# Patient Record
Sex: Male | Born: 1950
Health system: Southern US, Community
[De-identification: ages and names within clinical notes are randomized; demographics above are authoritative.]

## PROBLEM LIST (undated history)

## (undated) DIAGNOSIS — Z950 Presence of cardiac pacemaker: Secondary | ICD-10-CM

## (undated) DIAGNOSIS — I25810 Atherosclerosis of coronary artery bypass graft(s) without angina pectoris: Secondary | ICD-10-CM

## (undated) DIAGNOSIS — I480 Paroxysmal atrial fibrillation: Secondary | ICD-10-CM

## (undated) DIAGNOSIS — I2510 Atherosclerotic heart disease of native coronary artery without angina pectoris: Secondary | ICD-10-CM

## (undated) DIAGNOSIS — I10 Essential (primary) hypertension: Secondary | ICD-10-CM

## (undated) DIAGNOSIS — I340 Nonrheumatic mitral (valve) insufficiency: Secondary | ICD-10-CM

## (undated) DIAGNOSIS — R001 Bradycardia, unspecified: Secondary | ICD-10-CM

## (undated) DIAGNOSIS — Z951 Presence of aortocoronary bypass graft: Secondary | ICD-10-CM

## (undated) DIAGNOSIS — K469 Unspecified abdominal hernia without obstruction or gangrene: Secondary | ICD-10-CM

## (undated) DIAGNOSIS — E785 Hyperlipidemia, unspecified: Secondary | ICD-10-CM

## (undated) DIAGNOSIS — R9439 Abnormal result of other cardiovascular function study: Secondary | ICD-10-CM

## (undated) HISTORY — PX: TRANSTHORACIC ECHOCARDIOGRAM: SHX275

## (undated) HISTORY — DX: Presence of cardiac pacemaker: Z95.0

## (undated) HISTORY — PX: NM MYOCAR PERF WALL MOTION: HXRAD629

## (undated) HISTORY — DX: Abnormal result of other cardiovascular function study: R94.39

## (undated) HISTORY — DX: Essential (primary) hypertension: I10

## (undated) HISTORY — DX: Paroxysmal atrial fibrillation: I48.0

## (undated) HISTORY — DX: Nonrheumatic mitral (valve) insufficiency: I34.0

## (undated) HISTORY — DX: Unspecified abdominal hernia without obstruction or gangrene: K46.9

## (undated) HISTORY — PX: PACEMAKER INSERTION: SHX728

## (undated) HISTORY — DX: Hyperlipidemia, unspecified: E78.5

## (undated) HISTORY — PX: HERNIA REPAIR: SHX51

## (undated) HISTORY — DX: Atherosclerotic heart disease of native coronary artery without angina pectoris: I25.10

## (undated) HISTORY — DX: Atherosclerosis of coronary artery bypass graft(s) without angina pectoris: I25.810

## (undated) HISTORY — PX: APPENDECTOMY: SHX54

## (undated) HISTORY — DX: Presence of aortocoronary bypass graft: Z95.1

## (undated) HISTORY — DX: Bradycardia, unspecified: R00.1

---

## 1994-07-14 DIAGNOSIS — R001 Bradycardia, unspecified: Secondary | ICD-10-CM

## 1994-07-14 HISTORY — DX: Bradycardia, unspecified: R00.1

## 1998-03-12 ENCOUNTER — Ambulatory Visit (HOSPITAL_COMMUNITY): Admission: RE | Admit: 1998-03-12 | Discharge: 1998-03-12 | Payer: Self-pay | Admitting: Cardiology

## 1998-08-27 ENCOUNTER — Ambulatory Visit (HOSPITAL_BASED_OUTPATIENT_CLINIC_OR_DEPARTMENT_OTHER): Admission: RE | Admit: 1998-08-27 | Discharge: 1998-08-27 | Payer: Self-pay | Admitting: *Deleted

## 1998-08-29 ENCOUNTER — Ambulatory Visit (HOSPITAL_COMMUNITY): Admission: RE | Admit: 1998-08-29 | Discharge: 1998-08-29 | Payer: Self-pay | Admitting: *Deleted

## 2002-01-25 ENCOUNTER — Encounter: Payer: Self-pay | Admitting: Thoracic Surgery

## 2002-01-26 ENCOUNTER — Ambulatory Visit (HOSPITAL_COMMUNITY): Admission: RE | Admit: 2002-01-26 | Discharge: 2002-01-26 | Payer: Self-pay | Admitting: Thoracic Surgery

## 2002-03-10 ENCOUNTER — Encounter: Payer: Self-pay | Admitting: Thoracic Surgery

## 2002-03-10 ENCOUNTER — Encounter: Admission: RE | Admit: 2002-03-10 | Discharge: 2002-03-10 | Payer: Self-pay | Admitting: Thoracic Surgery

## 2002-07-28 ENCOUNTER — Encounter (INDEPENDENT_AMBULATORY_CARE_PROVIDER_SITE_OTHER): Payer: Self-pay | Admitting: Specialist

## 2002-07-28 ENCOUNTER — Ambulatory Visit (HOSPITAL_BASED_OUTPATIENT_CLINIC_OR_DEPARTMENT_OTHER): Admission: RE | Admit: 2002-07-28 | Discharge: 2002-07-28 | Payer: Self-pay | Admitting: Urology

## 2003-07-15 DIAGNOSIS — Z951 Presence of aortocoronary bypass graft: Secondary | ICD-10-CM

## 2003-07-15 DIAGNOSIS — I2510 Atherosclerotic heart disease of native coronary artery without angina pectoris: Secondary | ICD-10-CM

## 2003-07-15 HISTORY — DX: Presence of aortocoronary bypass graft: Z95.1

## 2003-07-15 HISTORY — DX: Atherosclerotic heart disease of native coronary artery without angina pectoris: I25.10

## 2004-01-11 ENCOUNTER — Encounter: Admission: RE | Admit: 2004-01-11 | Discharge: 2004-01-11 | Payer: Self-pay | Admitting: Cardiology

## 2004-01-17 ENCOUNTER — Ambulatory Visit (HOSPITAL_COMMUNITY): Admission: RE | Admit: 2004-01-17 | Discharge: 2004-01-18 | Payer: Self-pay | Admitting: Cardiology

## 2004-01-17 HISTORY — PX: CARDIAC CATHETERIZATION: SHX172

## 2004-01-24 ENCOUNTER — Inpatient Hospital Stay (HOSPITAL_COMMUNITY): Admission: RE | Admit: 2004-01-24 | Discharge: 2004-01-28 | Payer: Self-pay | Admitting: Cardiothoracic Surgery

## 2004-01-24 HISTORY — PX: CORONARY ARTERY BYPASS GRAFT: SHX141

## 2004-03-01 ENCOUNTER — Encounter: Admission: RE | Admit: 2004-03-01 | Discharge: 2004-03-01 | Payer: Self-pay | Admitting: Cardiothoracic Surgery

## 2004-03-05 ENCOUNTER — Ambulatory Visit (HOSPITAL_COMMUNITY): Admission: RE | Admit: 2004-03-05 | Discharge: 2004-03-05 | Payer: Self-pay | Admitting: Cardiothoracic Surgery

## 2004-03-29 ENCOUNTER — Encounter: Admission: RE | Admit: 2004-03-29 | Discharge: 2004-03-29 | Payer: Self-pay | Admitting: Cardiothoracic Surgery

## 2004-04-03 ENCOUNTER — Ambulatory Visit (HOSPITAL_COMMUNITY): Admission: RE | Admit: 2004-04-03 | Discharge: 2004-04-03 | Payer: Self-pay | Admitting: Cardiothoracic Surgery

## 2004-04-05 ENCOUNTER — Encounter: Admission: RE | Admit: 2004-04-05 | Discharge: 2004-04-05 | Payer: Self-pay | Admitting: Cardiothoracic Surgery

## 2004-04-19 ENCOUNTER — Encounter: Admission: RE | Admit: 2004-04-19 | Discharge: 2004-04-19 | Payer: Self-pay | Admitting: Cardiothoracic Surgery

## 2004-05-10 ENCOUNTER — Encounter: Admission: RE | Admit: 2004-05-10 | Discharge: 2004-05-10 | Payer: Self-pay | Admitting: Cardiothoracic Surgery

## 2004-05-14 ENCOUNTER — Ambulatory Visit (HOSPITAL_COMMUNITY): Admission: RE | Admit: 2004-05-14 | Discharge: 2004-05-14 | Payer: Self-pay | Admitting: Cardiothoracic Surgery

## 2004-05-14 ENCOUNTER — Encounter (INDEPENDENT_AMBULATORY_CARE_PROVIDER_SITE_OTHER): Payer: Self-pay | Admitting: *Deleted

## 2004-05-17 ENCOUNTER — Encounter: Admission: RE | Admit: 2004-05-17 | Discharge: 2004-05-17 | Payer: Self-pay | Admitting: Cardiothoracic Surgery

## 2004-05-31 ENCOUNTER — Encounter: Admission: RE | Admit: 2004-05-31 | Discharge: 2004-05-31 | Payer: Self-pay | Admitting: Cardiothoracic Surgery

## 2004-06-14 ENCOUNTER — Encounter: Admission: RE | Admit: 2004-06-14 | Discharge: 2004-06-14 | Payer: Self-pay | Admitting: Cardiothoracic Surgery

## 2004-06-28 ENCOUNTER — Encounter: Admission: RE | Admit: 2004-06-28 | Discharge: 2004-06-28 | Payer: Self-pay | Admitting: Cardiothoracic Surgery

## 2004-07-02 ENCOUNTER — Ambulatory Visit (HOSPITAL_COMMUNITY): Admission: RE | Admit: 2004-07-02 | Discharge: 2004-07-02 | Payer: Self-pay | Admitting: Cardiothoracic Surgery

## 2004-08-16 ENCOUNTER — Encounter: Admission: RE | Admit: 2004-08-16 | Discharge: 2004-08-16 | Payer: Self-pay | Admitting: Cardiothoracic Surgery

## 2004-08-19 ENCOUNTER — Encounter: Admission: RE | Admit: 2004-08-19 | Discharge: 2004-08-19 | Payer: Self-pay | Admitting: Cardiothoracic Surgery

## 2004-08-22 ENCOUNTER — Encounter (INDEPENDENT_AMBULATORY_CARE_PROVIDER_SITE_OTHER): Payer: Self-pay | Admitting: Specialist

## 2004-08-22 ENCOUNTER — Inpatient Hospital Stay (HOSPITAL_COMMUNITY): Admission: RE | Admit: 2004-08-22 | Discharge: 2004-08-28 | Payer: Self-pay | Admitting: Cardiothoracic Surgery

## 2004-08-22 ENCOUNTER — Encounter (INDEPENDENT_AMBULATORY_CARE_PROVIDER_SITE_OTHER): Payer: Self-pay | Admitting: *Deleted

## 2004-09-13 ENCOUNTER — Encounter: Admission: RE | Admit: 2004-09-13 | Discharge: 2004-09-13 | Payer: Self-pay | Admitting: Cardiothoracic Surgery

## 2004-10-18 ENCOUNTER — Encounter: Admission: RE | Admit: 2004-10-18 | Discharge: 2004-10-18 | Payer: Self-pay | Admitting: Cardiothoracic Surgery

## 2005-04-18 ENCOUNTER — Encounter: Admission: RE | Admit: 2005-04-18 | Discharge: 2005-04-18 | Payer: Self-pay | Admitting: Cardiothoracic Surgery

## 2005-08-14 DIAGNOSIS — I25810 Atherosclerosis of coronary artery bypass graft(s) without angina pectoris: Secondary | ICD-10-CM

## 2005-08-14 HISTORY — PX: NM MYOCAR PERF WALL MOTION: HXRAD629

## 2005-08-14 HISTORY — DX: Atherosclerosis of coronary artery bypass graft(s) without angina pectoris: I25.810

## 2005-09-11 DIAGNOSIS — R9439 Abnormal result of other cardiovascular function study: Secondary | ICD-10-CM

## 2005-09-11 HISTORY — DX: Abnormal result of other cardiovascular function study: R94.39

## 2005-09-16 ENCOUNTER — Ambulatory Visit (HOSPITAL_COMMUNITY): Admission: RE | Admit: 2005-09-16 | Discharge: 2005-09-16 | Payer: Self-pay | Admitting: Cardiology

## 2005-09-16 HISTORY — PX: CARDIAC CATHETERIZATION: SHX172

## 2005-09-18 IMAGING — CR DG CHEST 2V
2 series · 2 of 2 positions shown · non-contrast
Comparison: none

CLINICAL DATA: Left lower chest pain. Follow up left pleural effusion.
CHEST, TWO VIEWS ? 09/13/04:

[view not recorded (1 of 2)]
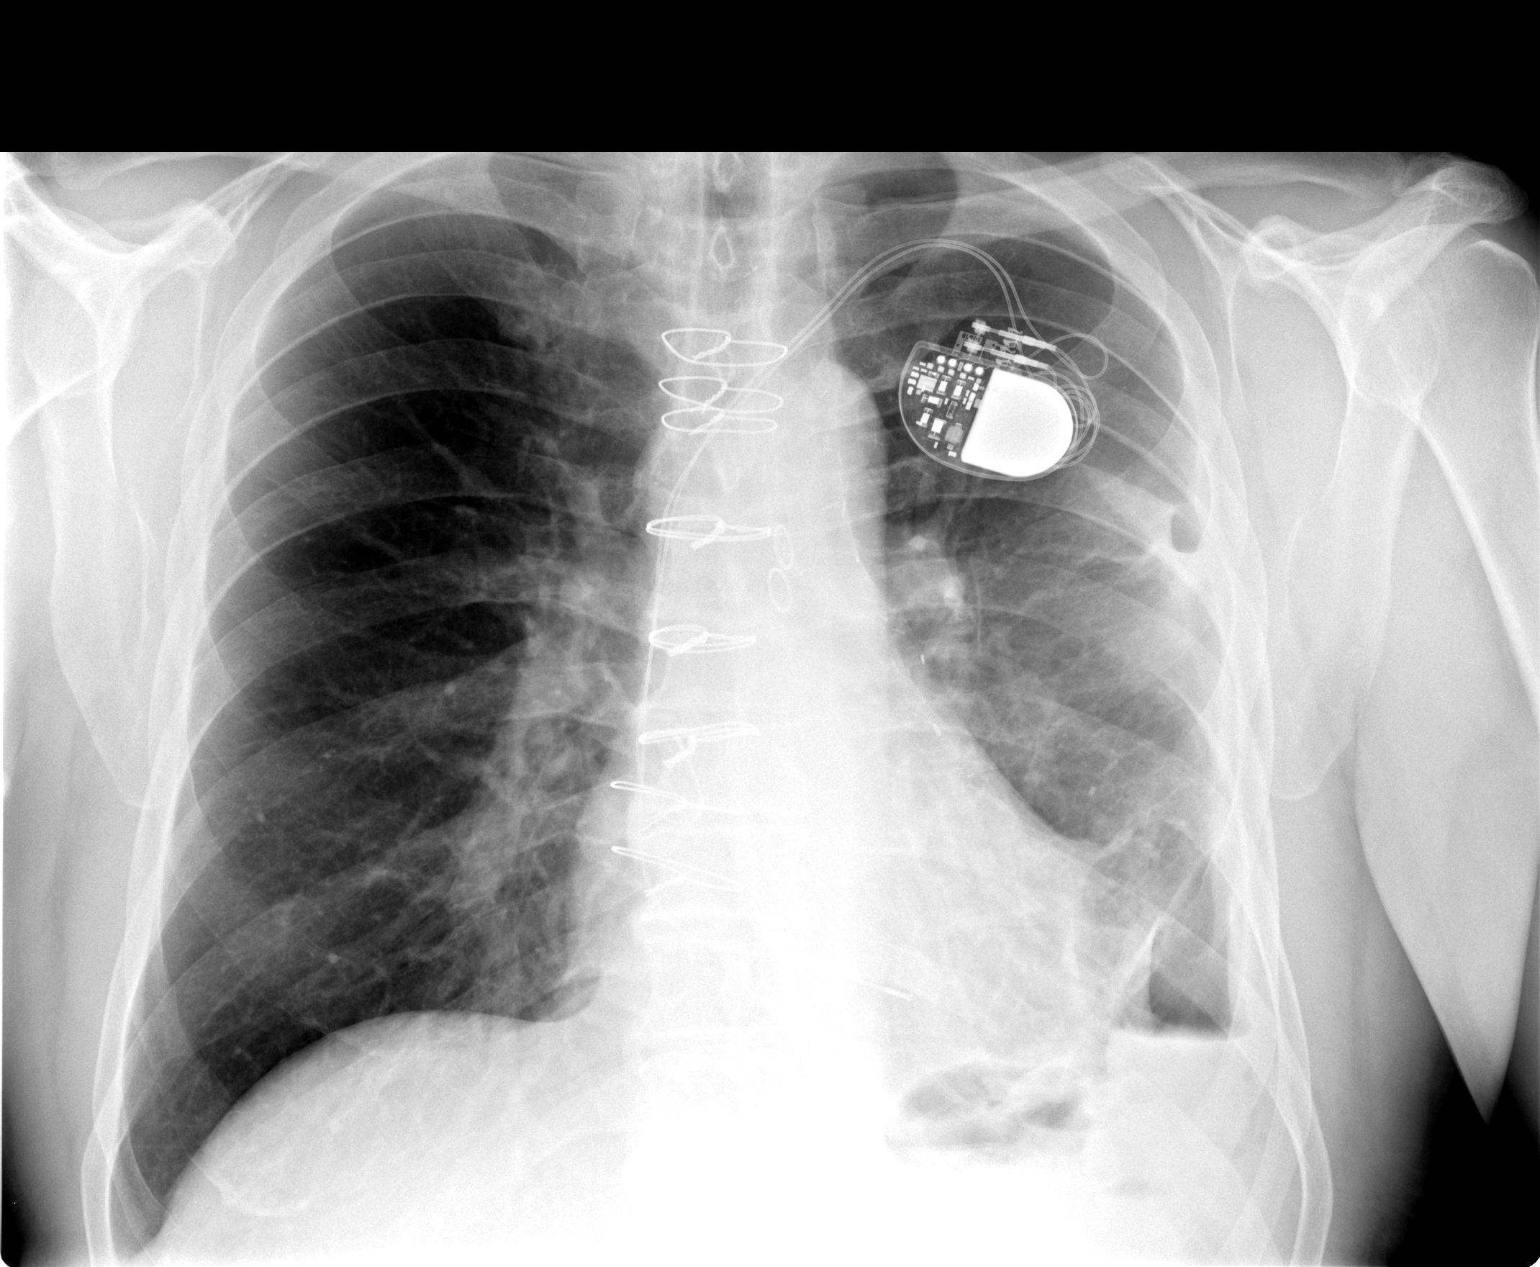

[view not recorded (2 of 2)]
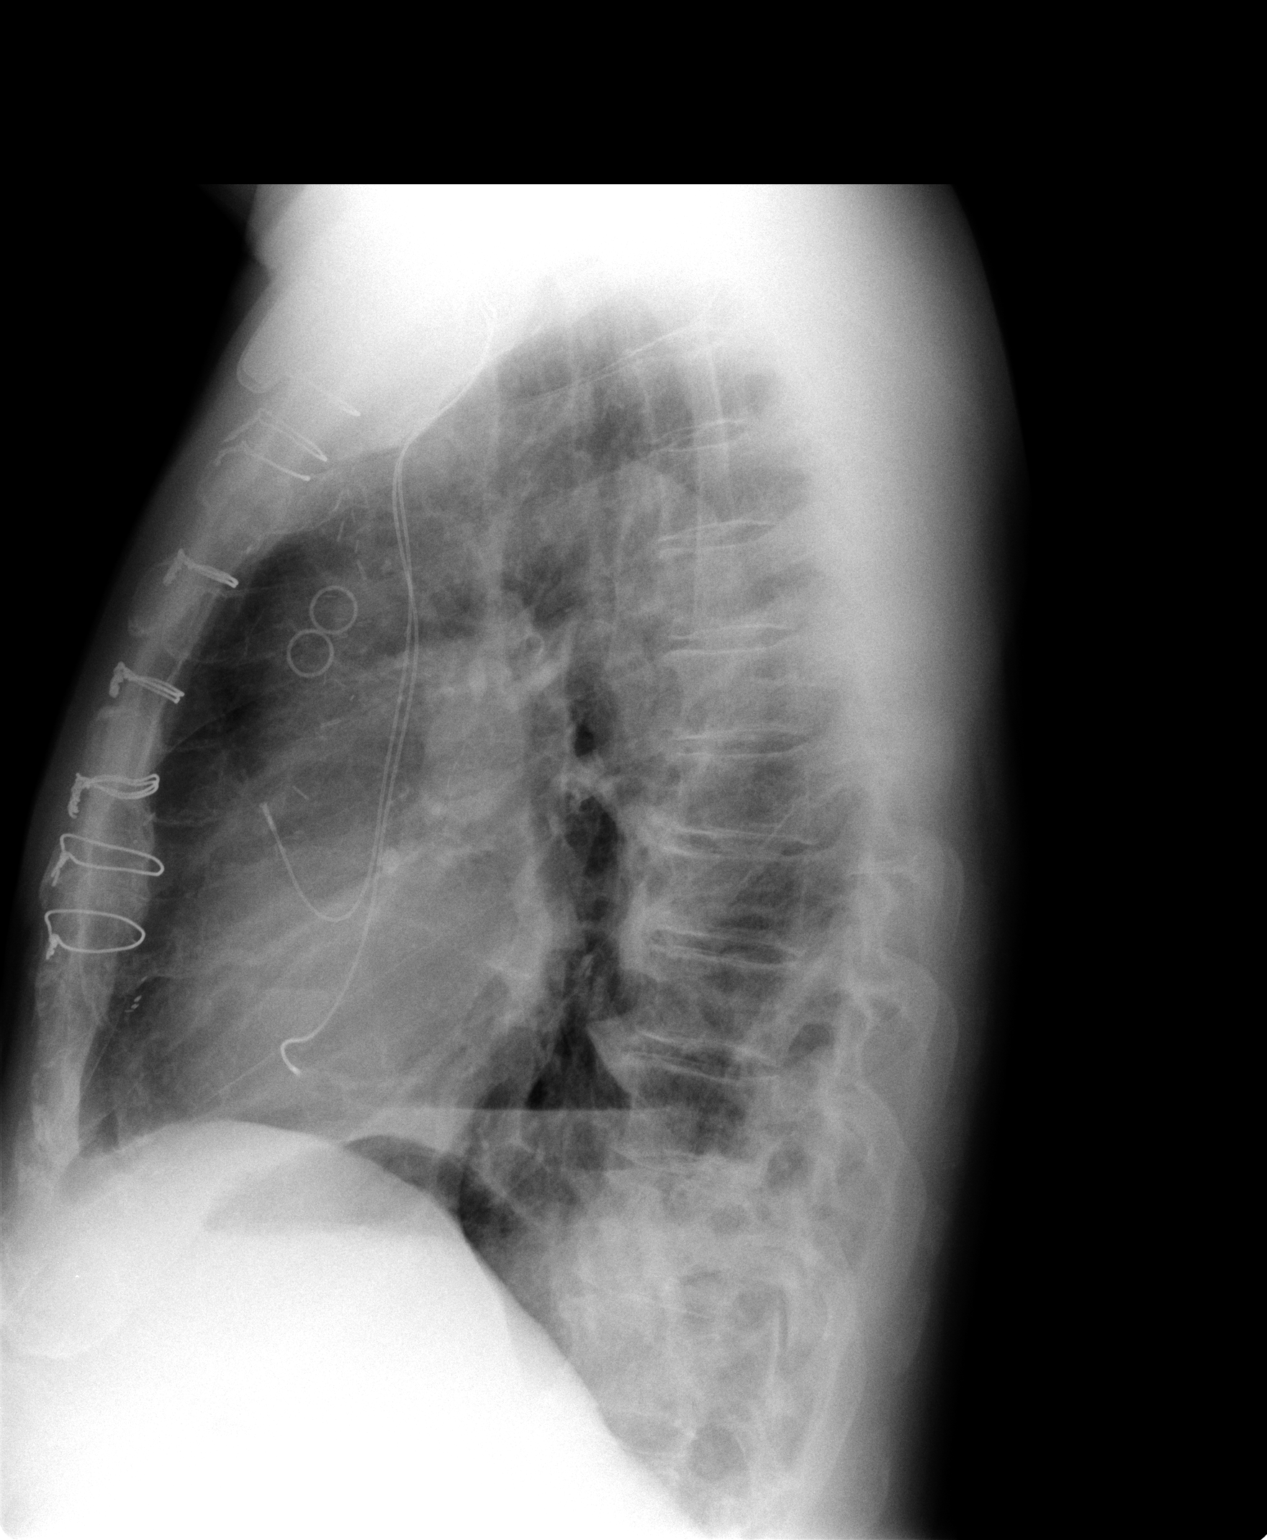

[2 of 2 positions shown; findings below may reference images not displayed]

FINDINGS: Since [REDACTED] chest x-ray of 08/28/04, there is decreased small areas of basilar and lesser upper loculated hydropneumothorax.  Linear subsegmental atelectasis or scarring is seen at the left lung base.  The lungs are otherwise clear.  Heart size is normal.  CABG changes and left sided permanent cardiac pacemaker leads are stable.
IMPRESSION: Since [REDACTED] chest x-ray of 08/28/04:
1.  Decreased loculated small left basilar and mild hydropneumothorax.
2.  Left basilar linear scarring and/or subsegmental atelectasis.
3.  Otherwise no active disease.

## 2006-08-04 ENCOUNTER — Ambulatory Visit (HOSPITAL_COMMUNITY): Admission: RE | Admit: 2006-08-04 | Discharge: 2006-08-04 | Payer: Self-pay | Admitting: *Deleted

## 2008-05-24 ENCOUNTER — Encounter (INDEPENDENT_AMBULATORY_CARE_PROVIDER_SITE_OTHER): Payer: Self-pay | Admitting: General Surgery

## 2008-05-24 ENCOUNTER — Ambulatory Visit (HOSPITAL_BASED_OUTPATIENT_CLINIC_OR_DEPARTMENT_OTHER): Admission: RE | Admit: 2008-05-24 | Discharge: 2008-05-24 | Payer: Self-pay | Admitting: General Surgery

## 2009-04-24 ENCOUNTER — Encounter: Admission: RE | Admit: 2009-04-24 | Discharge: 2009-04-24 | Payer: Self-pay | Admitting: Endocrinology

## 2010-11-26 NOTE — Op Note (Signed)
NAME:  [name], [name] NO.:  [id number]   MEDICAL RECORD NO.:  [id number]          PATIENT TYPE:  AMB   LOCATION:  [facility]                         FACILITY:  [facility]   PHYSICIAN:  Anselm Pancoast. Weatherly, M.D.DATE OF BIRTH:  1950-09-09   DATE OF PROCEDURE:  05/24/2008  DATE OF DISCHARGE:                               OPERATIVE REPORT   PREOPERATIVE DIAGNOSES:  Left inguinal hernia and also previously  infected epidermoid cyst, left arm.   OPERATION:  Left inguinal herniorrhaphy.  It was an indirect hernia with  a direct component and excision of a previously infected cyst on the  left arm.   ANESTHESIA:  General anesthesia, LOA with local supplementation.   HISTORY:  Erik Lane is a 60 year old Caucasian male who was  referred to me by Dr. Juleen China for a mildly symptomatic left inguinal  hernia.  The patient was seen in September.  He had seen Dr. Clarene Duke, his  cardiologist, recently.  He has got hypertension and has had coronary  artery disease with a pacemaker inserted in 1996.  He had bypass in 2005  and his last catheterization was with Dr. Clarene Duke in 2007.  He had small  vessels.  He has had a colonoscopy and he had a ruptured appendicitis  through a fairly large right lower quadrant incision 10 or more years  ago.  On exam he had an obvious left inguinal hernia that was easily  reducible but he had a large infected cyst on the left arm that I  unroofed in the office and treated with antibiotics.  I placed him on  Septra.  This had been I and D'd earlier in the year at one of the  urgent care centers and the area on the arm has greatly decreased in  size but there was still now an area about the size of a quarter that  you could feel the cyst contents and a lot of kind of resolving  erythema.  There has been no drainage or anything from the cyst on the  arm and I recommended that when we fixed this hernia that we will go  ahead and excise the area and keep him on  antibiotics.  The patient was  in agreement with this and preoperatively his laboratory studies were  normal, white count normal 6100 and electrolytes were normal.  We used  LOA, clipped the pubic hair, the left arm and left groin had both been  marked preoperatively and then the patient's areas were prepped with  Betadine solution.  We prepped the arm after completion of surgery and  just did it with sterile towels and just changed my gloves.  The left  inguinal incision area sharp dissection down through the skin and  superficial veins x2 were clamped, divided and ligated with fine Vicryl  and the external oblique aponeurosis was opened exposing the inguinal  canal area.  I elevated the cord structures with a Penrose drain.  It  was a fairly long indirect component about 4-5 inches kind of going down  into the top of the scrotum.  It was carefully separated  from the cord  structures and then a high sac ligation performed with 0 Surgilon under  direct vision with the sac open.  I put a second suture just distal and  removed the hernia sac, retracted up in the preperitoneal space and  anesthetized this with a few mLs of a mixture of Marcaine and Xylocaine.  I had done an inguinal block at the start of the procedure with a  blunted 21 gauge needle and then had anesthetized the skin area with a  25 gauge needle.  The cord structures then were elevated with a Penrose  and he has a direct component where this hernia has sort of broken down  the floor and I reapproximated the floor, the inguinal ligament was  sutured to the conjoined tendon with a running 2-0 Prolene and the two  tails sutured back together.  I then used a piece of Prolene mesh shaped  like a sail slit laterally to reinforce the floor.  It was sutured to  the inguinal ligament inferior with running 2-0 Prolene and then the  superior flap was sutured down with a couple of interrupted 2-0 Prolene  and predominantly 2-0 Vicryl.   It is lying flat, not under excessive  tension and I adjusted the little area at the internal ring area instead  of compromising the cord structures.  I then closed the external oblique  aponeurosis after removing the Penrose with a running 3-0 Vicryl and  then Scarpa's fascia was closed with interrupted 3-0 Vicryl, 4-0 Dexon  subcuticular, Benzoin and Steri-Strips on the skin.  The testicle was in  its normal position at the completion of the surgery.  The ilioinguinal  nerve had been placed with the cord structures coming out through the  internal ring where the mesh has been placed around.  Next I prepped the  left arm with a Betadine solution then used about 5 mL of the same  anesthetic solution as just kind of a field block more proximally and  then ellipsed out this area and it was previously infected epidermoid  cyst, no actual pus contents of this time but a lot of bleeding from the  previous inflammatory response.  Several little vessels were requiring  suturing with 4-0 Vicryl and then I closed the skin with alternating  simple and mattress sutures of 3-0 nylon.  I am going to place him on  Keflex for approximately 5 days and see him back in the office next week  and hopefully he will be able to return to work in approximately 2  weeks.  The epidermoid cyst was sent for pathologic exam.  The hernia  sac I did not send.           ______________________________  Anselm Pancoast. Zachery Dakins, M.D.     WJW/MEDQ  D:  05/24/2008  T:  05/24/2008  Job:  [id number]   cc:   Brooke Bonito, M.D.  Fax: [phone]

## 2010-11-29 NOTE — Op Note (Signed)
[place]  Patient:    Erik Lane, Erik Lane Visit Number: [id number] MRN: [id number]          Service Type: DSU Location: [facility] 2899 24 Attending Physician:  Cameron Proud Dictated by:   D. Karle Plumber, M.D. Admit Date:  01/26/2002 Discharge Date: 01/26/2002                             Operative Report  PREOPERATIVE DIAGNOSIS:  Generator failure.  POSTOPERATIVE DIAGNOSIS:  Generator failure.  OPERATION PERFORMED:  Generator replacement.  SURGEON:  D. Karle Plumber, M.D.  ANESTHESIA:  Xylocaine 1% with IV sedation.  DESCRIPTION OF PROCEDURE:  After prepping and draping the left chest, an area was infiltrated with 1% Xylocaine over the old pacemaker site, which the pacemaker was a DDD that had been put in for seven years and was now having failure.  An incision was made and dissection was carried down to the pacemaker generator.  The capsule was opened with a knife and the suture cut and the generator was dissected out.  It was unipolar but the patient had an intensive irregular rhythm of about 53.  The leads were then analyzed and the atrial lead, which was a 433-03 [employer] lead, had a voltage of 0.9 with an MA of 2.6 and a pulse width was 0.5 and the impedance was 430 ohms with 4.2 mV.  The ventricular lead was 0.4 minimum, the MA was 1.3, had 461 impedance with a pulse width of 0.5.  R wave was 11.4.  The P wave was 4.2 mV.  This was then connected to a new pacemaker, a Sigma [id number]-[id number], serial [id number], and this was placed in the pocket and sutured in the pocket with 3-0 silk.  Then the wound was closed with 3-0 Vicryl in the subcutaneous tissue and Dermabond on the skin.  The patient was returned to the recovery room in stable condition. Dictated by:   D. Karle Plumber, M.D. Attending Physician:  Cameron Proud DD:  01/26/02 TD:  01/28/02 Job: [id number]

## 2010-11-29 NOTE — Op Note (Signed)
NAME:  DILLIN, LOFGREN NO.:  [id number]   MEDICAL RECORD NO.:  [id number]          PATIENT TYPE:  INP   LOCATION:  2304                         FACILITY:  [facility]   PHYSICIAN:  Kathlee Nations Trigt III, M.D.DATE OF BIRTH:  08-Feb-1951   DATE OF PROCEDURE:  08/22/2004  DATE OF DISCHARGE:                                 OPERATIVE REPORT   OPERATION:  1.  Bronchoscopy  2.  Less video-assisted thoracoscopic surgery, thoracotomy, and      decortication of left lung.   PREOPERATIVE DIAGNOSIS:  Recurrent left pleural effusion, status post  coronary artery bypass graft.   POSTOPERATIVE DIAGNOSIS:  Recurrent left pleural effusion, status post  coronary artery bypass graft.   SURGEON:  Kerin Perna, M.D.   ASSISTANTDeniece Portela. Gold, P.A.-C.   ANESTHESIA:  General by Dr. Kipp Brood.   INDICATIONS:  The patient is a 60 year old male who underwent coronary  bypass grafting x4 in late 2005 utilizing left internal mammary artery  harvest.  He developed a late left pleural effusion which required  thoracentesis x3.  The cytologies and cultures on the fluid were negative  and appeared to be an inflammatory transudate.  Because of the recurrent  nature of this effusion despite all less invasive measures, it was felt that  a left VATS-thoracotomy and decortication would be indicated.  The patient  was symptomatic from his recurrent pleural effusion with shortness of breath  on exertion.   Prior to surgery, I examined the patient the office and reviewed the results  of his latest chest x-ray and CT scan and diagnosis of left recurrent  pleural effusion with the patient.  I discussed the recommended procedure  with him to include bronchoscopy and left VATS and thoracotomy with  decortication.  I discussed the use of general anesthesia, the location and  size of the surgical incision, the use of postoperative chest tube drainage,  and expected postoperative hospital recovery  period.  I reviewed with him  the major risks and complications of this procedure, including risks of  bleeding, pneumonia, prolonged air leak, infection, and death.  He  understood the alternatives to surgical therapy would be continued medical  therapy of this effusion, which is not been working.  He was ready to  proceed with the operation at this time.  Previously had discussed doing  this operation eight to 12 weeks earlier and he declined surgery because he  was not ready for another operation.  At this point he does understand the  aspects and risks, benefits, and agrees to proceed with operation as planned  under what I feel with an informed consent.   OPERATIVE FINDINGS:  The left lower pleural space was lined with a was a  leather-like compartment with a thick leather pleural peel on the lung  extending to the chest wall and diaphragm and the mediastinum.  This was  removed and both the visceral and parietal pleural specimens were sent for  pathology.  Bloody pleural fluid was sent for cytology and cultures.  A  pleural biopsy as well as pleural fluid was sent  for AFB cultures.   PROCEDURE:  The patient was brought to the operating room and placed supine  on the operating table, where  general anesthesia was induced.  Through an  endotracheal tube, a flexible bronchoscope was placed.  The distal trachea and carina were normal with a sharp angulation of the  carina.  The right mainstem bronchus was examined and found to be normal  without bronchial lesions.  The right upper lobe, right middle lobe and  right lower lobe segmental orifices were all examined and found to be patent  without  endobronchial lesions or mucosal abnormalities.  There were no secretions.  The bronchoscope was then withdrawn to the carina and passed down the left  mainstem bronchus.  The left mainstem bronchus was normal without  endobronchial lesions.  The segmental bronchial orifices of the left upper   lobe and left lower lobe were then examined individually and found to be  patent without endobronchial lesions or mucosal abnormalities.  There are no  secretions.  The bronchoscope was then withdrawn back.  The patient was then  exchanged for a double-lumen endotracheal tube by the anesthesiologist.  This was confirmed by bronchoscopy to be properly placed.  The patient was  then turned to expose the left chest, which was prepped and draped as a  sterile field.  An incision was made in the left sixth interspace.  The  video-assisted scope was inserted and the thorax was examined and found to  have a thick peel over the lung with some bloody pleural effusion.  This was  drained and sent for cytologies and culture. The video-scope was then  withdrawn.  The incision was extended along the fifth interspace to  approximately 12 cm.  A retractor was used to gently retract the ribs, but  the ribs were not divided or broken.  After removing all the pleural fluid,  it was apparent that the patient's  inflammatory process resulted with a  thick leather-like pleural peel over the left lower lobe and the lingula of  the left upper lobe.  This was sharply incised with a scalpel blade and  using gentle sharp and blunt dissection, a plane was developed between the  visceral pleura and this peel.  The peel was removed in the multiple large  pieces with care being taken to minimize injury to the lung.  The lingular  aspect was then also dissected using a separate sharp incision with a  scalpel and developing the tissue planes on either side of this incision to  mobilize the peel off the visceral surface of the lung.  This was extended  all the way to the pericardium and the mediastinum.  On the lower lobe the  decortication was extended all the way to the paraspinal area in the  posterior mediastinum.  Once the decortication was completed, the lung was irrigated with warm saline.  An area of parenchymal  separation was repaired  with a running of 4-0 chromic.  This was then over-coated with biologic  glue. Another area of denuded denuded, raw visceral pleural was also coated  with biologic glue along the aspect of the upper lobe.  The lung was gently  reinflated and filled the space well.  Two chest tubes were then placed, one  directed posteriorly and directed anteriorly, and brought out through  separate stab incisions and secured to the skin.  The lung was fully  inflated and the incision was closed.  The ribs were closed with three  figure-of-eight pericostal #2 Vicryls.  The two muscle layers were closed  with interrupted 0 Vicryl.  The subcutaneous and skin were closed with a running Vicryl and sterile  dressings were applied.  Prior to closure of the On-Q tissue analgesia  system was inserted with two catheters placed above and below the chest  incision and secured to the skin with silk sutures.  The patient was then  extubated and returned to the recovery room in stable condition.      PV/MEDQ  D:  08/22/2004  T:  08/23/2004  Job:  [id number]   cc:   Thereasa Solo. Little, M.D.  [address]  [address]  [place]  [place] [address]  Fax: [phone]

## 2010-11-29 NOTE — Cardiovascular Report (Signed)
NAME:  CASSANDRA, MCMANAMAN NO.:  [id number]   MEDICAL RECORD NO.:  [id number]          PATIENT TYPE:  OIB   LOCATION:  2899                         FACILITY:  [facility]   PHYSICIAN:  Thereasa Solo. Little, M.D. DATE OF BIRTH:  [birth year]   DATE OF PROCEDURE:  09/16/2005  DATE OF DISCHARGE:  09/16/2005                              CARDIAC CATHETERIZATION   This 60 year old male has had coronary bypass grafting July 2005. 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 The Cardiolite study was positive showing inferior  lateral ischemia. Because of this, he is brought to the cath lab for  outpatient cardiac catheterization.   After obtaining informed consent, the patient was prepped and draped in the  usual sterile fashion exposing the right groin. Following local anesthetic  with 1% Xylocaine, the Seldinger technique was employed and a 5-French  introducer sheath was placed in the right femoral artery. Left and right  coronary arteriography, graft visualization and ventriculography in the RAO  projection was performed.   COMPLICATIONS:  None.   TOTAL CONTRAST USED:  105 cc.   EQUIPMENT:  5-French Judkins configuration catheters. A RCB catheter was  required for the saphenous vein grafts. The internal mammary artery was  easily cannulated with the right coronary catheter.   RESULTS:  1.  Hemodynamic monitoring: Central aortic pressure was 108/70. Left      ventricular pressure 104/4 with no aortic valve gradient noted at the      time of pullback.  2.  Ventriculography: Ventriculography in the RAO projection revealed an      ejection fraction of approximately 50% and end-diastolic pressure was      10. There was some ectopy noted during the ventriculogram but no focal      wall motion abnormalities were noted.  3.  Coronary arteriography: On fluoroscopy, pacemaker wires were seen in      both the atrium and  the ventricle and there was dense calcification in      the left main, LAD and circumflex distribution.    1.  Left main: It bifurcated and was free of disease.  2.  LAD: The proximal portion of the LAD had a 40-50% area of eccentric      narrowing. There was a moderate-sized diagonal that was free of disease.      After the diagonal, there was evidence of bidirectional flow in the mid      and distal LAD. This was supplied by the internal mammary artery to the      distal LAD.  3.  Circumflex: The circumflex had diffuse disease in it. The first OM was      very small faintly visualized. The OM #2 was diffusely diseased      proximally, OM #3 was a moderate-sized vessel. There was tenting of both      these OM's where the saphenous vein graft inserted. The ongoing      circumflex had mild to moderate irregularities.  4.  Right coronary artery: Right coronary  was a codominant vessel giving      rise to the PDA. There was a mild area of 30% lucency in its midportion.      There was brisk TIMI III flow.   GRAFTS:  1.  Internal mammary artery to the LAD: The internal mammary artery was      widely patent. It inserted into the distal LAD which crossed the apex of      the heart and was free of disease. There was retrograde filling up the      LAD and into the diagonal.  2.  Saphenous vein graft to OM-1: This graft was totally occluded its      ostium.  3.  Saphenous vein graft sequentially to OM-2 and OM-3: The graft was widely      patent. OM-2 was a small graft with a small vessel. OM-3 was a larger      vessel. There was no high-grade stenosis in the ongoing OMs. Retrograde      filling of both the OMs showed rather significant disease proximally.   CONCLUSION:  1.  Loss of the saphenous vein graft to obtuse marginal-1. Widely patent      internal mammary artery to the left anterior descending artery, widely      patent saphenous vein graft sequentially to the obtuse marginal-2 and       obtuse marginal-3.  2.  Widely patent right coronary artery.  3.  Normal left ventricular systolic function.   I cannot from an anatomical standpoint correlate this strongly positive  nuclear study with what I find in catheterization today. It appears that the  nuclear study is a false-positive test. I see no reason he can not safely  continue his job as a [employer].   Because of this false-positive study, in two years when he will require a  repeat Cardiolite study, one would expect that to also showed similar  findings which will be positive and would probably prompt a cardiac  catheterization. 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           ______________________________  Thereasa Solo Little, M.D.     ABL/MEDQ  D:  09/16/2005  T:  09/16/2005  Job:  [id number]   cc:   Catheter Lab   Brooke Bonito, M.D.  Fax: [phone]

## 2010-11-29 NOTE — Op Note (Signed)
NAME:  MANTAJ, CHAMBERLIN                        ACCOUNT NO.:  [id number]   MEDICAL RECORD NO.:  [id number]                   PATIENT TYPE:  INP   LOCATION:  2304                                 FACILITY:  [facility]   PHYSICIAN:  Kerin Perna III, M.D.           DATE OF BIRTH:  1950-12-01   DATE OF PROCEDURE:  01/24/2004  DATE OF DISCHARGE:                                 OPERATIVE REPORT   OPERATION:  Coronary artery bypass grafting x 4 (left internal mammary  artery to the left anterior descending, saphenous vein graft to first obtuse  marginal, sequential saphenous vein graft to second obtuse marginal and  third obtuse marginal).   PREOPERATIVE DIAGNOSIS:  Class IV progressive angina with severe two vessel  coronary artery disease.   POSTOPERATIVE DIAGNOSIS:  Class IV progressive angina with severe two vessel  coronary artery disease.   SURGEON:  Kerin Perna, M.D.   ASSISTANT:  Toribio Harbour, N.P.-C.   ANESTHESIA:  General by Dr. Adonis Huguenin   INDICATIONS FOR PROCEDURE:  The patient is a 60 year old male who presented  with dyspnea on exertion and decreasing exercise tolerance.  Cardiac  catheterization by Dr. Caprice Kluver demonstrated severe two vessel disease of  the proximal LAD and of the circumflex system.  His ejection fraction was  55% and he was not felt to be a candidate for percutaneous intervention due  to the location of and anatomy of his LAD long stenosis.  I examined the  patient in the office and reviewed the results of the cardiac cath with the  patient.  I discussed the indications and expected benefits of coronary  bypass surgery for treatment of his coronary artery disease.  I discussed  the alternatives to surgical therapy for treatment of his coronary artery  disease and the expected outcome of those alternative therapies.  I reviewed  with the patient the major aspects of the planned procedure including the  choice of conduit for grafts to include  the mammary artery and saphenous  vein, the location of the surgical incisions, the use of general anesthesia  and the expected postoperative hospital recovery.  I discussed with the  patient the risks to him of coronary bypass surgery including the risks of  MI, CVA, bleeding, blood transfusion requirement, infection, and death.  He  understood the implications for the surgery and agreed to proceed with the  operation as planned under what I felt was an informed consent.   OPERATIVE FINDINGS:  The vein was harvested endoscopically from the right  leg and was of good quality.  The second diagonal branch of the LAD was not  grafted due to a hemodynamically insignificant disease.  The ventricle had  no focal areas of scarring or fibrosis.  The patient did not require blood  products for the operation.   PROCEDURE:  The patient was brought to the operating room and placed supine  on the  operating table.  General endotracheal anesthesia was induced.  The  chest, abdomen, and legs were prepped with Betadine and draped as a sterile  field.  A sternal incision was made as the saphenous vein was harvested from  the right leg endoscopically.  The left IMA was harvested as a pedicle graft  at its origin of the subclavian vessels and was a good vessel with excellent  flow.  Heparin was administered and the ACT was documented as being  therapeutic.  The sternal retractor was placed and the pericardium was  suspended.  Purse-strings were placed in the ascending aorta and right  atrium and the patient was cannulated and placed on bypass.  The coronaries  were identified for grafting and the mammary artery and vein grafts were  prepared for the distal anastomoses.  A cardioplegia cannula was placed in  the ascending aorta and the patient was cooled to 32 degrees.  The aortic  Cross clamp was applied.  800 mL of cold blood cardioplegia was delivered to  the aortic root with good cardioplegic arrest and  septal temperature  dropping to less than 14 degrees.  Topical iced saline slush was used to  augment myocardial preservation and a pericardial insufflator pad was used  to protect the left phrenic nerve.   The distal coronary anastomoses were then performed.  The first distal  anastomosis was to the OM1.  This was a 1.5 mm vessel and had a proximal 70%  stenosis.  A reversed saphenous vein was sewn end-to-side with running 7-0  Prolene.  There was good flow through the graft.  The second and third  distal anastomoses consisted of a sequential vein graft to the OM2 and OM3.  The OM2 was a 1.75 mm vessel with a proximal 80% stenosis and a side-to-side  anastomosis with the vein was sewn using running 7-0 Prolene.  There was  good flow through the anastomosis.  The third distal anastomosis was a  continuation of the sequential vein to the OM3.  This was a 1.5 mm vessel  with a proximal 60-70% stenosis.  The end of the vein was sewn end-to-side  with running 7-0 Prolene, there was good flow to the graft.  The  cardioplegia was redosed.  The fourth distal anastomosis was to the distal  third of the LAD.  This was a 1.5 mm vessel with proximal 80% stenosis at  its origin.  The left IMA pedicle was brought through an opening created in  the left lateral pericardium and was brought down onto the LAD and sewn end-  to-side with running 8-0 Prolene.  There was good flow through the  anastomosis with immediate rise in septal temperature after release of the  pedicle clamp on the mammary artery.  The mammary pedicle was secured to the  epicardium and the aortic Cross clamp was removed.   The heart was cardioverted back to a regular rhythm.  A partial occlusion  clamp was placed on the ascending aorta and two proximal vein anastomoses  were performed using 4 mm punch and running 6-0 Prolene.  The partial clamp  was removed and vein grafts were perfused.  Each had good flow and hemostasis was  documented at the proximal and distal anastomoses.  Temporary  pacing wires were applied.  The patient was rewarmed to 37 degrees.  The  lungs were expanded and the ventilator resumed.  The patient was weaned from  bypass without inotropes with good blood pressure and good cardiac  output  were stable.  Protamine was administered without adverse reaction.  The  cannulae were removed.  The mediastinum was irrigated with warm antibiotic  irrigation.  The leg incisions were irrigated and closed in a standard  fashion.  The pericardium was loosely reapproximated superiorly over the  aorta and vein grafts.  Two mediastinal and left pleural chest tubes were  placed and brought out through separate incisions.  The sternum was closed  with interrupted steel wire.  The pectoralis fascia was closed with a  running #1 Vicryl.  The subcutaneous tissue and skin layers were closed with  running Vicryl and sterile dressings were applied.  Total bypass time was  125 minutes with a Cross clamp time of 55 minutes.                                               Mikey Bussing, M.D.    PV/MEDQ  D:  01/24/2004  T:  01/24/2004  Job:  [id number]   cc:   Thereasa Solo. Little, M.D.  [address]  [address]  [place]  [place] [address]  Fax: [phone]

## 2010-11-29 NOTE — Discharge Summary (Signed)
NAME:  Erik Lane, Erik Lane                        ACCOUNT NO.:  [id number]   MEDICAL RECORD NO.:  [id number]                   PATIENT TYPE:  OIB   LOCATION:  2013                                 FACILITY:  [facility]   PHYSICIAN:  Thereasa Solo. Little, M.D.              DATE OF BIRTH:  04-15-1951   DATE OF ADMISSION:  01/17/2004  DATE OF DISCHARGE:  01/18/2004                                 DISCHARGE SUMMARY   DISCHARGE DIAGNOSES:  1. Increasing angina.  2. Coronary artery disease, significant, with plans for coronary artery     bypass grafting by Dr.  Donata Clay.  3. Hyperlipidemia.  4. History of syncope with pacemaker insertion in 1996 and upgraded July     2003.   CONDITION ON DISCHARGE:  Stable.   DISCHARGE MEDICATIONS:  1. Norvasc 5 mg 1 daily.  2. Lescol XL 80 mg daily.  3. Toprol XL 50 mg daily.  4. Enteric-coated aspirin 325 mg daily.  5. Nitroglycerin 1/150 sublingually as needed.   DISCHARGE INSTRUCTIONS:  1. No work.  2. No strenuous activity.  3. Low-fat, low-salt diet.  4. Wash catheterization site with soap and water.  Call if any bleeding,     swelling, or drainage.  5. See Dr. Donata Clay January 19, 2004, at 2 p.m.   HISTORY OF PRESENT ILLNESS:  A 60 year old white widowed male with a history  of coronary artery disease with prior catheterization revealing moderate  disease with a 50% LAD, 60% circumflex in 1996 and again in 1999.  Other  history includes hyperlipidemia, syncope with permanent pacemaker initially  implanted in 1996, upgraded to a [employer]onic in July 2003.  He also has  elevated cholesterol.   ALLERGIES:  No known allergies.   SOCIAL HISTORY:  Widowed with two children, no tobacco.   FAMILY HISTORY:  Positive for coronary artery disease.  Father died in his  20s; mother died at 45, one brother with heart disease.   PAST MEDICAL HISTORY:  As stated previously, also appendectomy and  hypertension.   REVIEW OF SYSTEMS:  See H&P.   PHYSICAL  EXAMINATION AT DISCHARGE:  VITAL SIGNS: Blood pressure 106/70,  pulse 60, respirations 20, temperature 97.5, oxygen saturation on room air  96%.  GENERAL:  Alert and oriented white male.  HEART:  S1 and S2, regular rate and rhythm.  LUNGS:  Clear.  ABDOMEN:  Soft, nontender.  Positive bowel sounds.  EXTREMITIES:  Right groin catheterization site without hematoma.  Right  dorsalis pedis 2+ and equal to the left.   LABORATORY DATA PRIOR TO DISCHARGE:  Hemoglobin 14.4, hematocrit 41.8, WBC  7.1, and platelets 174.  Sodium 139, potassium 3.7, BUN 11, creatinine 0.8,  and glucose 96.   PROCEDURES:  Cardiac catheterization done on January 17, 2004, showed  significant coronary disease.  Please see dictated report.  Left main  normal, though he had calcium deposits left main, LAD, and  proximal  circumflex.  LAD ostial 50 to 60% and down into his first diagonal with  maximal narrowing of 75 to 80%.  Ostium of first diagonal 50%, left  circumflex eccentric 60% at takeoff of the OM-1.  Three large OM's:  OM-2  ostial 50 to 60% stenosis.  RCA okay and a small PDA and posterolateral  artery.  EF was normal systolic function, mitral valve prolapse, EDP 9.  There was no renal artery stenosis, no abdominal aortic aneurysm.   HOSPITAL COURSE:  Erik Lane was admitted for cardiac catheterization on  January 17, 2004, which he underwent without complications, but due to his  disease status, he was kept overnight and was stable for discharge the  morning of January 18, 2004.  He was seen and evaluated by Dr. Elsie Lincoln.  The  patient will see Dr. Donata Clay on July 8 and knows not to work until after  his bypass grafting.      Darcella Gasman. Ingold, N.P.                     Thereasa Solo. Little, M.D.    LRI/MEDQ  D:  01/18/2004  T:  01/19/2004  Job:  [id number]   cc:   Brooke Bonito, M.D.  [address]  [place]  [place] [address]  Fax: [phone]   Mikey Bussing, M.D.  [address]  [place]  [place]  [address]

## 2010-11-29 NOTE — Op Note (Signed)
NAME:  Erik Lane, Erik Lane NO.:  [id number]   MEDICAL RECORD NO.:  [id number]          PATIENT TYPE:  AMB   LOCATION:  ENDO                         FACILITY:  [facility]   PHYSICIAN:  Georgiana Spinner, M.D.    DATE OF BIRTH:  1950/12/17   DATE OF PROCEDURE:  08/04/2006  DATE OF DISCHARGE:                               OPERATIVE REPORT   SURGEON:  Georgiana Spinner, M.D.   PROCEDURE:  Colonoscopy.   INDICATIONS:  Colon cancer screening.   ANESTHESIA:  Fentanyl 75 mcg, Versed 7 mg.   DESCRIPTION OF PROCEDURE:  With the patient mildly sedated in the left  lateral decubitus position, a rectal exam was performed, which was  unremarkable to my examination.  Subsequently, the Pentax videoscopic  colonoscope was inserted into the rectum and passed under direct vision  to the cecum, identified by the ileocecal valve and appendiceal orifice,  both of which were photographed.  From this point, the colonoscope was  slowly withdrawn, taking circumferential views of colonic mucosa,  stopping in the rectum, which appeared normal, and showed hemorrhoids on  retroflex view.  The endoscope was then straightened and withdrawn.  The  patient's vital signs and pulse oximetry remained stable.  The patient  tolerated the procedure well and without apparent complications.   FINDINGS:  Internal hemorrhoids, otherwise, a rather unremarkable  colonoscopic examination.   PLAN:  Consider repeat examination in 5 to 10 years.           ______________________________  Georgiana Spinner, M.D.     GMO/MEDQ  D:  08/04/2006  T:  08/04/2006  Job:  [id number]

## 2010-11-29 NOTE — Discharge Summary (Signed)
NAME:  Erik Lane, Erik Lane NO.:  [id number]   MEDICAL RECORD NO.:  [id number]                   PATIENT TYPE:  INP   LOCATION:  2018                                 FACILITY:  [facility]   PHYSICIAN:  Kerin Perna, M.D.               DATE OF BIRTH:  1950-12-19   DATE OF ADMISSION:  01/24/2004  DATE OF DISCHARGE:  01/28/2004                                 DISCHARGE SUMMARY   HISTORY OF PRESENT ILLNESS:  This is a 60 year old, hypertensive, white male  referred to Dr. Donata Clay for consideration of surgical coronary  revascularization due to recently diagnosed severe two-vessel disease.  The  patient has a history of coronary disease as well as previous placement of a  pacemaker in 1996, for bradyarrhythmia.  The patient describes recent  substernal chest tightness and fullness relieved with rest.  He did not have  any resting symptoms or symptoms of orthopnea or PND.  The patient felt he  had a decreased exercise tolerance with shortness of breath on exertion.  He  was evaluated by Dr. Clarene Duke in his office and subsequent cardiac  catheterization was performed demonstrating 90% stenosis of the LAD and  diagonal, 80% stenosis of the obtuse marginal-2 and 70% stenosis of the  obtuse marginal-1.  His ejection fraction was 55% with a left ventricular  end-diastolic pressure of 10 mmHg.  Based on the coronary anatomy and  symptoms of Class III angina, he was felt to be a candidate for surgical  revascularization and was admitted this hospitalization for the procedure.   MEDICATIONS:  1.  Norvasc 5 mg daily.  2.  Lescol XL 80 mg q.d.  3.  Toprol XL 50 mg q.d.  4.  Aspirin one daily.   ALLERGIES:  No known drug allergies.   PAST MEDICAL HISTORY:  1.  Hypertension.  2.  Hyperlipidemia.  3.  Syncope with bradycardia and permanent pacemaker placement.   For family history, social history, review of systems, physical exam, please  see History and Physical done  at the time of admission.   HOSPITAL COURSE:  The patient was admitted electively and on January 24, 2004,  taken to the operating room where he underwent coronary artery bypass graft  x4.  The following grafts were placed:  Left internal mammary artery to the  left anterior descending, saphenous vein graft to obtuse marginal-1,  saphenous vein graft sequential to obtuse marginal-2 and obtuse marginal-3.  Cross clamp time was 55 minutes with pump time 125 minutes.  The patient  tolerated the procedure well and was taken to the surgical intensive care  unit in stable condition.   The patient has done well postoperatively.  He has maintained stable  hemodynamics.  He was weaned from the ventilator without difficulty.  Initially, he did require some Neo-Synephrine for blood pressure, but this  was weaned without difficulty.  All routine lines, monitors and drainage  devices were discontinued in a standard fashion.  The patient did have an  episode of abnormal pacer activity requiring interrogation which was  performed on January 26, 2004, with resolution of abnormal pacemaker activity.  The patient has responded well to routine cardiac rehabilitation phase I  modalities.  Incisions were all healing well without signs of infection.  Oxygen has been weaned and he maintains good saturations on room air.  Overall, the patient was felt to be quite stable for discharge on January 28, 2004.   DISCHARGE MEDICATIONS:  1.  Lescol 80 mg daily.  2.  Aspirin 325 mg daily.  3.  Tylox one to two every four to six hours as needed.   FOLLOW UP:  Follow up with Dr. Donata Clay.  The office would call for  arrangements.  Dr. Clarene Duke would follow up with the patient in 2 weeks.   DISCHARGE DIAGNOSES:  1.  Severe two-vessel coronary artery disease as described, now status post      surgical revascularization.  2.  Previous permanent pacemaker.  3.  Hypertension.   CONDITION ON DISCHARGE:  Stable and  improved.      [name], P.A.-C.                    Kerin Perna, M.D.    [name]  D:  03/26/2004  T:  03/26/2004  Job:  [id number]   cc:   Thereasa Solo. Little, M.D.  [address]  [address]  [place]  [place] [address]  Fax: [phone]   Brooke Bonito, M.D.  [address]  [place]  [place] [address]  Fax: [phone]

## 2010-11-29 NOTE — Op Note (Signed)
NAME:  [name], Erik Lane                        ACCOUNT NO.:  [id number]   MEDICAL RECORD NO.:  [id number]                   PATIENT TYPE:  AMB   LOCATION:  [facility]                                 FACILITY:  [facility]   PHYSICIAN:  Excell Seltzer. Annabell Howells, M.D.                 DATE OF BIRTH:  1950-08-06   DATE OF PROCEDURE:  DATE OF DISCHARGE:                                 OPERATIVE REPORT   PREOPERATIVE DIAGNOSIS:  Scrotal lesions.   POSTOPERATIVE DIAGNOSIS:  Scrotal lesions.   PROCEDURE:  Excision of scrotal  lesions.   SURGEON:  Excell Seltzer. Annabell Howells, M.D.   ASSISTANT:  Crecencio Mc, M.D.   ANESTHESIA:  General.   COMPLICATIONS:  None.   INDICATIONS:  The patient is a 60 year old gentleman who presented to the  urology clinic with a complaint of scrotal lesions. These scrotal lesions  were longstanding and were noted by the patient to cause discomfort. On  examination, they were consistent with probable sebaceous cysts. After  discussing options, the patient elected to have these surgically  removed.  The potential risks and benefits of this procedure were explained to the  patient and he consented.   DESCRIPTION OF PROCEDURE:  The patient was brought to the operating room and  general anesthetic was administered. The patient was laid supine and his  genitalia were prepped and draped in the usual sterile fashion.   Next his scrotum was examined. This revealed multiple whitish appearing  scrotal lesions varying in size. There were approximately 20 lesions total,  with the smallest measuring approximately 1 mm and the largest measuring  approximately 3 cm.   There was noted to be a very small lesion inferiorly in the midline of the  scrotum and isolated. This was removed with simple excision with Metzenbaum  scissors and this area was cauterized.   There was also noted to be a large area across the anterior middle of the  scrotum which contained approximately 15 to 16 lesions. It was decided  to  excise these in a large elliptical skin incision, as the patient had plenty  of scrotal skin to cover this defect. Therefore a large transverse  elliptical incision was made, covering an area of approximately 12 x 8 cm.   An incision was made with a #15 blade  and Bovie electrocautery was used to  dissect the skin off of the dartos tissue. Once this entire skin island was  removed, hemostasis was achieved with electrocautery.   The dartos tissue was then brought together with interrupted 3-0 chromic  sutures. A second skin layer closure was then performed with a running  vertical mattress 3-0 chromic suture. The edges were infolded approximately  1 to 2 cm, resulting in a nice cosmetic result.   There were also noted to be 2 larger lesions more superiorly on the anterior  scrotum on either side of the penile base. These were  excised individually  in the aforementioned manner, and closed with vertical mattress skin  sutures.   A fluff dressing was then applied to the scrotum and a scrotal support was  placed. The patient was able to be extubated and transferred to the recovery  room in satisfactory condition. Please note that Dr. Bjorn Pippin was the  operating surgeon and was present and participated in this entire procedure.      Crecencio Mc, M.D.                          Excell Seltzer. Annabell Howells, M.D.    LB/MEDQ  D:  07/28/2002  T:  07/28/2002  Job:  [id number]

## 2010-11-29 NOTE — Discharge Summary (Signed)
NAME:  Erik Lane, Erik Lane NO.:  [id number]   MEDICAL RECORD NO.:  [id number]          PATIENT TYPE:  INP   LOCATION:  2004                         FACILITY:  [facility]   PHYSICIAN:  Kerin Perna, M.D.  DATE OF BIRTH:  [birth year]   DATE OF ADMISSION:  08/22/2004  DATE OF DISCHARGE:  08/28/2004                                 DISCHARGE SUMMARY   HISTORY OF PRESENT ILLNESS:  The patient is a 60 year old white male who  underwent four vessel coronary artery bypass grafting in July 2005, for  progressive angina with severe coronary artery disease.  He developed left  pleural effusion approximately eight to 10 weeks following the surgery and  was treated with thoracentesis with short term relief of his symptoms and  shortness of breath and pleuritic chest pain.  However, the infusions have  returned despite courses of oral prednisone and Lasix.  A CT scan performed  in late September showed no evidence of neoplastic disease.  His cytologies  and cultures of the fluid have been negative with reactive mesothelial cells  and no growth on culture.  A thoracentesis was last performed on July 02, 2004, when 250 mL of bloody effusion was drained.  A chest x-ray showed  recurrence of the effusion.  Dr. Donata Clay discussed the left video assisted  thoracoscopy and decortication procedure with the patient.  It was felt that  the patient had no other nonsurgical options for full relief of symptoms and  agreed to proceed with the surgery and was admitted this hospitalization for  the procedure.  The patient had no symptoms of fever or weight loss and he  was still working a full shift at [employer].   MEDICATIONS PRIOR TO ADMISSION:  1.  Aspirin 81 mg a day.  2.  Lipitor 10 mg a day.   ALLERGIES:  None.   Family history, social history, review of systems and physical examination  please see the history and physical done at the time of admission.   PAST SURGICAL  HISTORY:  1.  Appendectomy age 27.  2.  Permanent pacemaker placed in 1996, generator change 2003.  3.  Coronary artery bypass grafting as described above 2005.   HOSPITAL COURSE:  The patient was admitted electively and on August 22, 2004, taken to the operating room where he underwent the following  procedure:  1.  Bronchoscopy.  2.  Left video assisted thoracoscopy, thoracotomy, and decortication of the      left lung.  The patient tolerated the procedure well and was taken to the post  anesthesia care unit in stable condition.   POSTOPERATIVE HOSPITAL COURSE:  Patient has done well.  He has been  monitored closely and chest tubes have been discontinued in the standard  stepwise fashion.  The patient has remained hemodynamically stable.  His  incision is healing well without evidence of infection.  All cultures and  pathology are negative.  His last chest tube was removed on August 27, 2004.  Chest x-ray will be obtained in the morning of August 28, 2004, and  if there are no significant findings and he is otherwise clinically stable,  he will be discharged at that time.   LABORATORY DATA:  Body fluid cultures negative growths x4 days.  Tissue  culture negative growth x4 days.  Electrolytes, BUN and creatinine within  normal limits with the exception of slightly elevated glucose of 111 on  August 26, 2004.  Most recent CBC reveals the white blood cell count to be  9.0, hemoglobin and hematocrit 11.1 and 32.9, platelet count 193,000 on  August 24, 2004.  Gram stain of the body fluid collection showed no  organisms, no white blood cells.  Acid fast bacilli culture and smear is  negative thus far.   DISCHARGE MEDICATIONS:  As preoperatively. Additionally for pain, Tylox one  or two every four to six hours as needed.   INSTRUCTIONS:  The patient will receive written instructions in regard to  medications, activity, diet, wound care and follow-up.   FOLLOW UP:  Kerin Perna, M.D., on September 13, 2004, at 11:15.  His sutures  will be removed on September 02, 2004 at the [facility] office by the nurse.   FINAL DIAGNOSES:  1.  Recurrent left pleural effusion, status post coronary artery bypass      grafting, now status post thoracotomy and decortication with excellent      clinical results.  2.  Other diagnoses as previously listed per the history.   CONDITION ON DISCHARGE:  Stable and improved.      WEG/MEDQ  D:  08/27/2004  T:  08/27/2004  Job:  [id number]   cc:   Thereasa Solo. Little, M.D.  [address]  [address]  [place]  [place] [address]  Fax: [phone]   Brooke Bonito, M.D.  [address]  [place]  [place] [address]  Fax: [phone]

## 2010-11-29 NOTE — Cardiovascular Report (Signed)
NAME:  Erik Lane, Erik Lane                        ACCOUNT NO.:  [id number]   MEDICAL RECORD NO.:  [id number]                   PATIENT TYPE:  OIB   LOCATION:  2857                                 FACILITY:  [facility]   PHYSICIAN:  Thereasa Solo. Little, M.D.              DATE OF BIRTH:  [birth year]   DATE OF PROCEDURE:  01/17/2004  DATE OF DISCHARGE:                              CARDIAC CATHETERIZATION   INDICATION FOR TEST:  This 60 year old gentleman has a permanent pacemaker  in for symptomatic bradycardia.  It was placed in the late 1990s.  He has  had in the past chest pain and had a nuclear study performed that was false  positive.  The study in 1999 showed ischemia but at the time of this cardiac  catheterization he did not have significant coronary disease.  He presented  now with a recurrence of exertional chest pain at this time, clearly anginal  in nature, and because of this is brought in for an outpatient cardiac  catheterization.   After obtaining informed consent, the patient was prepped and draped in the  usual sterile fashion exposing the right groin.  Following local anesthetic  with 1% Xylocaine, the Seldinger technique was employed and a 5 [place]  introducer sheath was placed into the right femoral artery.  Left and right  coronary arteriography, ventriculography in the RAO projection, and a distal  aortogram were performed.   COMPLICATIONS:  None.   EQUIPMENT:  5 French Judkins configuration catheters.   TOTAL CONTRAST:  115 mL.   RESULTS:  1. Hemodynamic monitoring:  Central aortic pressure was 126/71.  Left     ventricular pressure was 124/9 with no significant valve gradient noted     at the time of pullback.  2. Ventriculography:  Ventriculography in the RAO projection revealed normal     left ventricular systolic function.  There was mitral valve prolapse     without mitral regurgitation seen.  There was ventricular ectopy during     the ventriculogram.  The left  ventricular end-diastolic pressure was 9.  3. Distal aortogram done at the level of the renal arteries showed no     evidence of renal artery stenosis and no abdominal aortic aneurysm.   CORONARY ARTERIOGRAPHY:  On fluoroscopy there was dense calcification seen  in the left main, proximal circumflex, and the LAD.   1. Left main normal.  It bifurcated.  2. LAD:  The LAD extended down to the apex of the heart.  There was ostial     50-60% narrowing, and this area of narrowing extended well down past the     ostium of the first diagonal.  The maximum amount of narrowing was about     75-80% just proximal to the diagonal vessel.  The ostium of the diagonal     also had an area of 50% narrowing.  The distal vessel did not have  significant disease.  3. Circumflex:  The circumflex was a large vessel giving rise to three OM     vessels, one of which bifurcated.  OM-2 had an ostial area of 50-60%     narrowing.  In a bend in the ongoing circumflex at the takeoff of the     first OM was a mildly calcified area of 60% eccentric narrowing.  4. Right coronary artery:  The right coronary artery was a small vessel with     a small PDA and posterolateral vessel that showed no significant disease.   CONCLUSION:  1. Normal left ventricular systolic function.  2. Mitral valve prolapse.  3. No abdominal aortic aneurysm.  4. Calcific coronary disease extending from the ostium of the left anterior     descending coronary artery past the first diagonal with disease involving     the circumflex and the ostium of obtuse marginal #2.   Because of the location of the left anterior descending artery disease, I do  not believe that this can be managed safely percutaneously.  I have asked  the surgeons to evaluate the patient for revascularization.  If for some  reason he would be turned down attempts at intervention would be undertaken,  but it would clearly be at a high risk in the event there would be   retrograde dissection of the left main.  It would also require Rotablator  because of the dense calcium load.                                               Thereasa Solo. Little, M.D.    ABL/MEDQ  D:  01/17/2004  T:  01/18/2004  Job:  [id number]   cc:   Brooke Bonito, M.D.  [address]  [place]  [place] [address]  Fax: [phone]   Cardiac Catheterization Lab

## 2011-04-15 LAB — CBC
HCT: 44.7
Hemoglobin: 14.9
MCV: 88.9
RBC: 5.03
WBC: 6.1

## 2011-04-15 LAB — BASIC METABOLIC PANEL
BUN: 12
Calcium: 9.2
Creatinine, Ser: 0.79
GFR calc Af Amer: >60
GFR calc non Af Amer: >60

## 2011-04-15 LAB — DIFFERENTIAL
Basophils Relative: 0
Eosinophils Absolute: 0.1
Neutrophils Relative %: 65

## 2011-06-13 ENCOUNTER — Emergency Department (HOSPITAL_COMMUNITY): Payer: 59

## 2011-06-13 ENCOUNTER — Emergency Department (HOSPITAL_COMMUNITY)
Admission: EM | Admit: 2011-06-13 | Discharge: 2011-06-13 | Disposition: A | Discharge status: Home / Self Care | Payer: 59 | Attending: Emergency Medicine | Admitting: Emergency Medicine

## 2011-06-13 ENCOUNTER — Encounter: Payer: Self-pay | Admitting: Emergency Medicine

## 2011-06-13 DIAGNOSIS — R10816 Epigastric abdominal tenderness: Secondary | ICD-10-CM | POA: Insufficient documentation

## 2011-06-13 DIAGNOSIS — Z951 Presence of aortocoronary bypass graft: Secondary | ICD-10-CM | POA: Insufficient documentation

## 2011-06-13 DIAGNOSIS — Z950 Presence of cardiac pacemaker: Secondary | ICD-10-CM | POA: Insufficient documentation

## 2011-06-13 DIAGNOSIS — Z7982 Long term (current) use of aspirin: Secondary | ICD-10-CM | POA: Insufficient documentation

## 2011-06-13 DIAGNOSIS — R112 Nausea with vomiting, unspecified: Secondary | ICD-10-CM | POA: Insufficient documentation

## 2011-06-13 DIAGNOSIS — Z79899 Other long term (current) drug therapy: Secondary | ICD-10-CM | POA: Insufficient documentation

## 2011-06-13 DIAGNOSIS — I10 Essential (primary) hypertension: Secondary | ICD-10-CM | POA: Insufficient documentation

## 2011-06-13 DIAGNOSIS — R109 Unspecified abdominal pain: Secondary | ICD-10-CM | POA: Insufficient documentation

## 2011-06-13 DIAGNOSIS — R197 Diarrhea, unspecified: Secondary | ICD-10-CM | POA: Insufficient documentation

## 2011-06-13 LAB — COMPREHENSIVE METABOLIC PANEL
ALT: 28 U/L (ref 0–53)
Albumin: 4.5 g/dL (ref 3.5–5.2)
Alkaline Phosphatase: 75 U/L (ref 39–117)
Chloride: 101 mEq/L (ref 96–112)
GFR calc Af Amer: >90 mL/min (ref >90)
GFR calc non Af Amer: >90 mL/min (ref >90)
Potassium: 4.1 mEq/L (ref 3.5–5.1)

## 2011-06-13 LAB — URINALYSIS, ROUTINE W REFLEX MICROSCOPIC
Glucose, UA: NEGATIVE mg/dL
Hgb urine dipstick: NEGATIVE
Ketones, ur: 40 mg/dL — AB
Leukocytes, UA: NEGATIVE
Protein, ur: 30 mg/dL — AB

## 2011-06-13 LAB — URINE MICROSCOPIC-ADD ON

## 2011-06-13 LAB — DIFFERENTIAL
Eosinophils Absolute: 0 10*3/uL (ref 0.0–0.7)
Eosinophils Relative: 0 % (ref 0–5)
Lymphs Abs: 1.4 10*3/uL (ref 0.7–4.0)
Monocytes Absolute: 0.5 10*3/uL (ref 0.1–1.0)

## 2011-06-13 LAB — CBC
Hemoglobin: 15.4 g/dL (ref 13.0–17.0)
MCH: 29.7 pg (ref 26.0–34.0)
MCHC: 34 g/dL (ref 30.0–36.0)

## 2011-06-13 LAB — LIPASE, BLOOD: Lipase: 21 U/L (ref 11–59)

## 2011-06-13 MED ORDER — POLYETHYLENE GLYCOL 3350 17 G PO PACK: 17.0000 g | PACK | Freq: Every day | ORAL | Status: AC

## 2011-06-13 MED ORDER — IOHEXOL 300 MG/ML  SOLN
100.0000 mL | Freq: Once | INTRAMUSCULAR | Status: AC | PRN
  Administered 2011-06-13: 100 mL via INTRAVENOUS

## 2011-06-13 MED ORDER — HYDROMORPHONE HCL PF 1 MG/ML IJ SOLN
0.5000 mg | Freq: Once | INTRAMUSCULAR | Status: AC
  Administered 2011-06-13: 0.5 mg via INTRAVENOUS
  Filled 2011-06-13: qty 1

## 2011-06-13 NOTE — ED Notes (Signed)
PT. REPORTS MID ABDOMINAL PAIN ONSET THIS AFTERNOON WITH NAUSEA , DENIES VOMITTING OR DIARRHEA ,  CHILLS , NO FEVER.

## 2011-06-13 NOTE — ED Provider Notes (Signed)
History     CSN: [id number] Arrival date & time: 06/13/2011 12:35 AM   First MD Initiated Contact with Patient 06/13/11 0129      Chief Complaint  Patient presents with  . Abdominal Pain    (Consider location/radiation/quality/duration/timing/severity/associated sxs/prior treatment) HPI Patient reports approximately 12 hours of mid abdominal pain.  Reports this is crampy in nature.  She's been nauseated.  He denies vomiting.  Denies diarrhea.  He reports no bowel movement for 2 days.  He denies melena and hematochezia.  He denies chest pain shortness of breath fever and chills.  His pain is constant.  It is moderate in severity.  He's never had any pain like this before.  Never been told he has an abdominal aneurysm.  Denies flank pain.  Denies dysuria and urinary frequency Past Medical History  Diagnosis Date  . Hypertension   . Bradycardia     Past Surgical History  Procedure Date  . Coronary artery bypass graft   . Pacemaker insertion   . Hernia repair     No family history on file.  History  Substance Use Topics  . Smoking status: Never Smoker   . Smokeless tobacco: Not on file  . Alcohol Use: No      Review of Systems  All other systems reviewed and are negative.    Allergies  Review of patient's allergies indicates no known allergies.  Home Medications   Current Outpatient Rx  Name Route Sig Dispense Refill  . ASPIRIN 325 MG PO TBEC Oral Take 325 mg by mouth daily.      . ATORVASTATIN CALCIUM 40 MG PO TABS Oral Take 40 mg by mouth daily.      Marland Kitchen DIGOXIN 0.125 MG PO TABS Oral Take 125 mcg by mouth daily.        BP 119/75  Pulse 62  Temp(Src) 97.7 F (36.5 C) (Oral)  Resp 18  SpO2 96%  Physical Exam  Nursing note and vitals reviewed. Constitutional: He is oriented to person, place, and time. He appears well-developed and well-nourished.  HENT:  Head: Normocephalic and atraumatic.  Eyes: EOM are normal.  Neck: Normal range of motion.    Cardiovascular: Normal rate, regular rhythm, normal heart sounds and intact distal pulses.   Pulmonary/Chest: Effort normal and breath sounds normal. No respiratory distress.  Abdominal: Soft. He exhibits no distension.       Mild epigastric tenderness with guarding.  No rebound tenderness.  No right upper quadrant tenderness.  No pulsatile mass  Musculoskeletal: Normal range of motion.  Neurological: He is alert and oriented to person, place, and time.  Skin: Skin is warm and dry.  Psychiatric: He has a normal mood and affect. Judgment normal.    ED Course  Procedures (including critical care time)  Labs Reviewed  COMPREHENSIVE METABOLIC PANEL - Abnormal; Notable for the following:    Glucose, Bld 167 (*)    All other components within normal limits  CBC - Abnormal; Notable for the following:    WBC 10.8 (*)    All other components within normal limits  DIFFERENTIAL - Abnormal; Notable for the following:    Neutrophils Relative 83 (*)    Neutro Abs 8.9 (*)    All other components within normal limits  URINALYSIS, ROUTINE W REFLEX MICROSCOPIC - Abnormal; Notable for the following:    APPearance CLOUDY (*)    Bilirubin Urine SMALL (*)    Ketones, ur 40 (*)    Protein, ur 30 (*)  All other components within normal limits  URINE MICROSCOPIC-ADD ON  LIPASE, BLOOD   Ct Abdomen Pelvis W Contrast  06/13/2011  *RADIOLOGY REPORT*  Clinical Data: Abdominal pain, nausea, vomiting and diarrhea.  CT ABDOMEN AND PELVIS WITH CONTRAST  Technique:  Multidetector CT imaging of the abdomen and pelvis was performed following the standard protocol during bolus administration of intravenous contrast.  Contrast: OMNIPAQUE IOHEXOL 300 MG/ML IV SOLN  Comparison: None.  Findings: Trace loculated left-sided pleural fluid is noted, with associated scarring.  The visualized lung bases are grossly clear. The patient is status post median sternotomy; pacing leads are partially imaged.  The liver and  spleen are unremarkable in appearance.  The gallbladder is within normal limits.  The pancreas and adrenal glands are unremarkable.  Nonspecific perinephric stranding is noted bilaterally.  A 3 mm nonobstructing stone is noted at the interpole region of the left kidney.  The kidneys are otherwise unremarkable in appearance. There is no evidence of hydronephrosis.  No obstructing renal stones are seen.  No free fluid is identified.  The small bowel is unremarkable in appearance.  The stomach is filled with contrast and is within normal limits.  No acute vascular abnormalities are seen.  There is diffuse calcification along the abdominal aorta and its branches.  The appendix is not seen; there is no evidence for appendicitis. The colon is filled with fluid and air, and is unremarkable in appearance.  The bladder is largely decompressed, and is grossly unremarkable in appearance.  The prostate remains normal in size.  No inguinal lymphadenopathy is seen.  No acute osseous abnormalities are identified.  There are chronic bilateral pars defects at L5, with mild grade 1 anterolisthesis of L5 on S1.  IMPRESSION:  1.  No acute abnormalities identified within the abdomen or pelvis. 2.  3 mm nonobstructing stone at the interpole region of the left kidney. 3.  Diffuse calcification along the abdominal aorta and its branches. 4.  Trace loculated left-sided pleural fluid noted, with associated scarring.  5.  Chronic bilateral pars defects at L5, with mild grade 1 anterolisthesis of L5 on S1.  Original Report Authenticated By: Tonia Ghent, M.D.     1. Abdominal pain       MDM  Will evaluate further with a CT scan of his abdomen.  7:18 AM Patient feels much better at this time.  Will DC home with close PCP followup        Lyanne Co, MD 06/13/11 [phone]

## 2011-06-13 NOTE — ED Notes (Signed)
Pt states that since 13:00 he has been having abdominal pain. Pt states that the pain goes across his mid abdomen. Pt alert and oriented and able to follow commands and move extremities. Pt denies any problems with urination or bowel problems. Pt abdomen soft and bowel sounds present.

## 2013-04-13 DIAGNOSIS — I340 Nonrheumatic mitral (valve) insufficiency: Secondary | ICD-10-CM

## 2013-04-13 HISTORY — DX: Nonrheumatic mitral (valve) insufficiency: I34.0

## 2013-04-22 ENCOUNTER — Ambulatory Visit: Payer: 59 | Admitting: Cardiology

## 2013-04-22 ENCOUNTER — Ambulatory Visit (INDEPENDENT_AMBULATORY_CARE_PROVIDER_SITE_OTHER): Payer: BC Managed Care – PPO | Admitting: Cardiology

## 2013-04-22 ENCOUNTER — Other Ambulatory Visit: Payer: Self-pay | Admitting: Cardiovascular Disease

## 2013-04-22 ENCOUNTER — Encounter: Payer: Self-pay | Admitting: Cardiology

## 2013-04-22 VITALS — BP 136/84 | HR 60 | Ht 73.0 in | Wt 205.5 lb

## 2013-04-22 DIAGNOSIS — I2510 Atherosclerotic heart disease of native coronary artery without angina pectoris: Secondary | ICD-10-CM

## 2013-04-22 DIAGNOSIS — Z951 Presence of aortocoronary bypass graft: Secondary | ICD-10-CM

## 2013-04-22 DIAGNOSIS — I10 Essential (primary) hypertension: Secondary | ICD-10-CM

## 2013-04-22 DIAGNOSIS — R001 Bradycardia, unspecified: Secondary | ICD-10-CM

## 2013-04-22 DIAGNOSIS — Z950 Presence of cardiac pacemaker: Secondary | ICD-10-CM

## 2013-04-22 DIAGNOSIS — E785 Hyperlipidemia, unspecified: Secondary | ICD-10-CM

## 2013-04-22 DIAGNOSIS — I498 Other specified cardiac arrhythmias: Secondary | ICD-10-CM

## 2013-04-22 DIAGNOSIS — I4892 Unspecified atrial flutter: Secondary | ICD-10-CM

## 2013-04-22 LAB — PACEMAKER DEVICE OBSERVATION
AL AMPLITUDE: 1 mv
AL THRESHOLD: 1 V
BAMS-0001: 170 {beats}/min
RV LEAD AMPLITUDE: 11.2 mv
RV LEAD THRESHOLD: 1 V

## 2013-04-22 NOTE — Patient Instructions (Addendum)
Your physician recommends that you schedule a follow-up appointment in: 3 months with Dr.Croitoru  Your physician has requested that you have an echocardiogram. Echocardiography is a painless test that uses sound waves to create images of your heart. It provides your doctor with information about the size and shape of your heart and how well your heart's chambers and valves are working. This procedure takes approximately one hour. There are no restrictions for this procedure.  PLEASE HAVE YOUR PRIMARY TO SEND A COPY OF UPCOMING LABS  Your physician wants you to follow-up in 12 months Dr Herbie Baltimore.  You will receive a reminder letter in the mail two months in advance. If you don't receive a letter, please call our office to schedule the follow-up appointment.

## 2013-04-24 ENCOUNTER — Encounter: Payer: Self-pay | Admitting: Cardiology

## 2013-04-24 DIAGNOSIS — Z951 Presence of aortocoronary bypass graft: Secondary | ICD-10-CM | POA: Insufficient documentation

## 2013-04-24 DIAGNOSIS — I4892 Unspecified atrial flutter: Secondary | ICD-10-CM | POA: Insufficient documentation

## 2013-04-24 DIAGNOSIS — I10 Essential (primary) hypertension: Secondary | ICD-10-CM | POA: Insufficient documentation

## 2013-04-24 DIAGNOSIS — E1169 Type 2 diabetes mellitus with other specified complication: Secondary | ICD-10-CM | POA: Insufficient documentation

## 2013-04-24 DIAGNOSIS — E785 Hyperlipidemia, unspecified: Secondary | ICD-10-CM | POA: Insufficient documentation

## 2013-04-24 DIAGNOSIS — I2510 Atherosclerotic heart disease of native coronary artery without angina pectoris: Secondary | ICD-10-CM | POA: Insufficient documentation

## 2013-04-24 DIAGNOSIS — Z950 Presence of cardiac pacemaker: Secondary | ICD-10-CM | POA: Insufficient documentation

## 2013-04-24 DIAGNOSIS — R001 Bradycardia, unspecified: Secondary | ICD-10-CM | POA: Insufficient documentation

## 2013-04-24 NOTE — Assessment & Plan Note (Signed)
Several moles which is noted on interrogation of his device was placed on low difficult to tell what a true of the was noted. Currently in sinus rhythm. He has had such bleeding episodes, but, not sure if he would require any granulation [place] aflutter. He is due to followup with Dr. Royann Shivers in 3 months to reassess his device. I would deferred to Dr. Royann Shivers judgment after evaluating his pacemaker in detail with the things that the A. Fib burden is significant enough to treat with anticoagulation.

## 2013-04-24 NOTE — Assessment & Plan Note (Signed)
He had his device interrogated today it covers his be working quite well. Intermittent nodes which is, but nothing concerning. He is due for his formal M.D. Evaluation of his device in a roughly 3 months from Dr. Royann Shivers.

## 2013-04-24 NOTE — Assessment & Plan Note (Signed)
Generator change in 2003, due for a followup visit with Dr. Royann Shivers in 3 months.

## 2013-04-24 NOTE — Progress Notes (Signed)
PATIENT: DAYLAN JUHNKE MRN: [id number]  DOB: 1951-04-10   DOV:04/24/2013 PCP: Michiel Sites, MD  Clinic Note: Chief Complaint  Patient presents with  . Annual Exam    Occas chest discomfort usually at work, gets a weak feeling but resolves quickly.  No SOB, edema.  Stays tired all the time - works 60 hour weeks.   HPI: ALEXIE SAMSON is a 62 y.o. male with a PMH below who presents today for just about 1 year followup. He is a very pleasant gentleman, and former patient of Dr. Julieanne Manson whom I saw last in November of 2013. He is a long-standing cardiac history of synthetic bradycardia with pacemaker started in 1996 with degeneration 18 2003. He is awake CABG in 2005 was found to have was made at the small OM1 occluded after it was thought to be a false positive stress test in 2007.  He actually is just recently updated his DOT physical, Dr. 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 He no longer has to go through routine surveillance stress test for that reason.  Interval History: he he presented today without any real symptoms of anything consistent with angina. He says about 3 months ago he had an episode of squeezing his chest and 45 beats there were unusual in that he was okay. His exam ADLs and no other associated symptoms. He is main issue now is he is low back pain, withoccasional numbness in his arms or lying down.he denies any chest tightness or pressure with rest or exertion was had that one episode. No PND, orthopnea or edema. He does note occasional skipped beats and irregular heartbeats or palpitations but denies any rapid heart rates are lightheadedness, dizziness, weakness. As near syncope no TIA or RCA symptoms. No melena hematochezia or hematuria. No claudication symptoms.  He does note a general sense of feeling  weak and tired, but he does work 5x12 hour shifts in a row. He has an intermittent sensation of feeling weak and tired, they're very fleeting.  He has a pacemaker interrogated today. There is several mode switches but none within 3 minutes.  Past Medical History  Diagnosis Date  . Symptomatic sinus bradycardia 1996    status post pacemaker placement, exchanged in 2003 ([employer]tr[employer] Sigma, in DDDR)  . CAD in native artery 2005    multivessel CAD, ostial 50-60% LAD, mid LAD 75-80% after D1; OM 2 with 50-60% followed by 60% after OM1. RCA small vessel. --> CABG referral  . S/P CABG x 2 2005    LIMA-LAD, SVG-OM1, seq SVG-OM 2-OM-3  . CAD (coronary artery disease), autologous vein bypass graft 2007    occluded SVG-OM1  . 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 Hypertension   . Paroxysmal atrial flutter     no recent documentation, although frequent mode switches noted but less than 2 minutes interval.  . Dyslipidemia, goal LDL below 70     on statin.  Marland Kitchen Hernia   . Status post cardiac pacemaker procedure 1996, 2003    Medtronic Sigma   Prior Cardiac Evaluation and Past Surgical History: Past Surgical History  Procedure Laterality Date  . Coronary artery bypass graft  2005  . Pacemaker insertion  1996 ; 2003  symptomatic bradycardia.Generator Change 2003 (Medtronic Sigma) DDDR  . Hernia repair    . Appendectomy      No Known Allergies  Current Outpatient Prescriptions  Medication Sig Dispense Refill  . aspirin 325 MG EC tablet Take 325 mg by mouth daily.        Marland Kitchen atorvastatin (LIPITOR) 40 MG tablet Take 40 mg by mouth daily.        . digoxin (LANOXIN) 0.125 MG tablet Take 125 mcg by mouth daily.        Marland Kitchen diltiazem (CARDIZEM CD) 180 MG 24 hr capsule Take 180 mg by mouth daily.       No current facility-administered medications for this visit.    History   Social History Narrative   He is a father of 2, grandfather 4. He does chew tobacco on daily basis but quit smoking 12 years ago. Never drinks a fall. 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   ROS: A comprehensive Review of Systems - Negative except ppoor sleep, and fatigue. He just has a hard time getting to and following this a sleep. He has low back pain mostly in his right leg with numbness. This is the normal upper several months now. No GI or GU symptoms. No extra rashes or lesions.  PHYSICAL EXAM BP 136/84  Pulse 60  Ht 6\' 1"  (1.854 m)  Wt 205 lb 8 oz (93.214 kg)  BMI 27.12 kg/m2 General appearance: alert, cooperative, appears stated age, no distress and borderline obese HEENT: Stevens/AT, EOMI, MMM, anicteric sclera Neck: no adenopathy, no carotid bruit and no JVD Lungs: clear to auscultation bilaterally, normal percussion bilaterally and non-labored Heart: regular rate and rhythm, S1, split S2 normal, no murmur, click, rub or gallop; non-displaced PMI Abdomen: soft, non-tender; bowel sounds normal; no masses,  no organomegaly;  Extremities: extremities normal, atraumatic, no cyanosis, and trivial edema  Pulses: 2+ and symmetric;  Neurologic: Mental status: Alert, oriented, thought content appropriate Cranial nerves: normal (II-XII grossly intact)  FAO:ZHYQMVHQI today: Yes Rate:60  , Rhythm: A Sensed, V paced   Recent Labs: none recorded.  ASSESSMENT / PLAN: CAD in native artery TCC quite well without any true active cardiac symptoms. There is fleeting episodes of squeezing her quite clearly not anginal in nature, as he does not have mild exertion. 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 This leaves coronary angiography is the definitive diagnostic tool if there were symptoms to be concerning.  Paroxysmal atrial  flutter Several moles which is noted on interrogation of his device was placed on low difficult to tell what a true of the was noted. Currently in sinus rhythm. He has had such bleeding episodes, but, not sure if he would require any granulation [place] aflutter. He is due to followup with Dr. Royann Shivers in 3 months to reassess his device. I would deferred to Dr. Royann Shivers judgment after evaluating his pacemaker in detail with the things that the A. Fib burden is significant enough to treat with anticoagulation.  S/P CABG x 4 : LIMA-LAD, SVG-OM1 (known to be occluded), seq SVG-OM 2-OM 3 Doing quite well post CABG.  Is not clearly of a false positive stress test prior to that had been due to the occluded SVG-OM1. The initial cause of the OM one was too small to explain that level ischemia. At least the other 2 OM  grafts are patent.  He has not had an assessment of his ejection fraction system the liver stress test in 2007.We may not be taking nuclear stress test routinely, but I would like an echocardiogram just to ensure that his ejection fraction is stable.  Hypertension Well-controlled blood pressure on diltiazem only. It would be nice for this CAD to have him on a beta blocker, but he had had issues with those in the past. It also consider using an ACE inhibitor or ARB if his blood pressure were to increase. With a normal EF he should be fine with the diltiazem.  Dyslipidemia, goal LDL below 70 On statin, is due to get his labs(lipids and chemistry.) rechecked by Dr. Juleen China last fall.  He is due to see his PCP this fall, I would just as if the labs be checked along with his routine labs. (Full lipid panel, CBC and CMP.)I will defer to that time to Dr. Juleen China. I will be looking forward to reviewing the lab results.  symptomatic bradycardia -- status post pacemaker He had his device interrogated today it covers his be working quite well. Intermittent nodes which is, but nothing concerning. He is due for  his formal M.D. Evaluation of his device in a roughly 3 months from Dr. Royann Shivers.  Status post cardiac pacemaker procedure Generator change in 2003, due for a followup visit with Dr. Royann Shivers in 3 months.    Orders Placed This Encounter  Procedures  . EKG 12-Lead  . EKG 12-Lead    This order was created through External Result Entry  . 2D Echocardiogram without contrast    CABG X 4 ,PACEMAKER,CAD    Standing Status: Future     Number of Occurrences:      Standing Expiration Date: 04/22/2014    Order Specific Question:  Type of Echo    Answer:  Complete    Order Specific Question:  Where should this test be performed    Answer:  [facility]    Order Specific Question:  Reason for exam-Echo    Answer:  CAD Native Vessel  414.01    Order Specific Question:  Reason for exam-Echo    Answer:  Other - See Comments Section   Meds ordered this encounter  Medications  . diltiazem (CARDIZEM CD) 180 MG 24 hr capsule    Sig: Take 180 mg by mouth daily.     Followup: 12 moths  DAVID W. Herbie Baltimore, M.D., M.S. [facility] [address] [place], [place]  [address]  [phone] Pager # [phone]

## 2013-04-24 NOTE — Assessment & Plan Note (Signed)
Well-controlled blood pressure on diltiazem only. It would be nice for this CAD to have him on a beta blocker, but he had had issues with those in the past. It also consider using an ACE inhibitor or ARB if his blood pressure were to increase. With a normal EF he should be fine with the diltiazem.

## 2013-04-24 NOTE — Assessment & Plan Note (Addendum)
On statin, is due to get his labs(lipids and chemistry.) rechecked by Dr. Juleen China last fall.  He is due to see his PCP this fall, I would just as if the labs be checked along with his routine labs. (Full lipid panel, CBC and CMP.)I will defer to that time to Dr. Juleen China. I will be looking forward to reviewing the lab results.

## 2013-04-24 NOTE — Assessment & Plan Note (Signed)
TCC quite well without any true active cardiac symptoms. There is fleeting episodes of squeezing her quite clearly not anginal in nature, as he does not have mild exertion. 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 This leaves coronary angiography is the definitive diagnostic tool if there were symptoms to be concerning.

## 2013-04-24 NOTE — Assessment & Plan Note (Addendum)
Doing quite well post CABG.  Is not clearly of a false positive stress test prior to that had been due to the occluded SVG-OM1. The initial cause of the OM one was too small to explain that level ischemia. At least the other 2 OM grafts are patent.  He has not had an assessment of his ejection fraction system the liver stress test in 2007.We may not be taking nuclear stress test routinely, but I would like an echocardiogram just to ensure that his ejection fraction is stable.

## 2013-04-25 ENCOUNTER — Ambulatory Visit: Payer: 59 | Admitting: Cardiovascular Disease

## 2013-04-26 NOTE — Progress Notes (Signed)
Message sent to Shakila. 

## 2013-04-27 ENCOUNTER — Other Ambulatory Visit: Payer: Self-pay | Admitting: Cardiology

## 2013-04-27 NOTE — Telephone Encounter (Signed)
Rx was sent to pharmacy electronically. 

## 2013-05-11 ENCOUNTER — Ambulatory Visit (HOSPITAL_COMMUNITY)
Admission: RE | Admit: 2013-05-11 | Discharge: 2013-05-11 | Disposition: A | Discharge status: Home / Self Care | Payer: BC Managed Care – PPO | Source: Ambulatory Visit | Attending: Cardiovascular Disease | Admitting: Cardiovascular Disease

## 2013-05-11 DIAGNOSIS — Z950 Presence of cardiac pacemaker: Secondary | ICD-10-CM | POA: Insufficient documentation

## 2013-05-11 DIAGNOSIS — I498 Other specified cardiac arrhythmias: Secondary | ICD-10-CM | POA: Insufficient documentation

## 2013-05-11 DIAGNOSIS — Z951 Presence of aortocoronary bypass graft: Secondary | ICD-10-CM | POA: Insufficient documentation

## 2013-05-11 DIAGNOSIS — R001 Bradycardia, unspecified: Secondary | ICD-10-CM

## 2013-05-11 NOTE — Progress Notes (Signed)
2D Echo Performed 05/11/2013    Asencion Guisinger, RCS  

## 2013-05-12 ENCOUNTER — Telehealth: Payer: Self-pay | Admitting: *Deleted

## 2013-05-12 NOTE — Telephone Encounter (Signed)
Message copied by Tobin Chad on Thu May 12, 2013 12:58 PM ------      Message from: Marykay Lex      Created: Wed May 11, 2013 11:50 PM       Great result.  Normal function. No significant valve disease.            Marykay Lex, MD       ------

## 2013-05-12 NOTE — Telephone Encounter (Signed)
  Spoke to patient. Result given . Verbalized understanding patient request report to be sent to PCP Erik Lane

## 2013-08-11 ENCOUNTER — Encounter: Payer: Self-pay | Admitting: *Deleted

## 2013-08-12 ENCOUNTER — Ambulatory Visit: Payer: BC Managed Care – PPO | Admitting: Cardiovascular Disease

## 2013-08-16 ENCOUNTER — Ambulatory Visit (INDEPENDENT_AMBULATORY_CARE_PROVIDER_SITE_OTHER): Payer: No Typology Code available for payment source | Admitting: Cardiovascular Disease

## 2013-08-16 ENCOUNTER — Encounter: Payer: Self-pay | Admitting: Cardiovascular Disease

## 2013-08-16 VITALS — BP 122/64 | HR 68 | Resp 16 | Ht 73.0 in | Wt 209.6 lb

## 2013-08-16 DIAGNOSIS — I498 Other specified cardiac arrhythmias: Secondary | ICD-10-CM

## 2013-08-16 DIAGNOSIS — R001 Bradycardia, unspecified: Secondary | ICD-10-CM

## 2013-08-16 DIAGNOSIS — Z950 Presence of cardiac pacemaker: Secondary | ICD-10-CM

## 2013-08-16 LAB — MDC_IDC_ENUM_SESS_TYPE_INCLINIC
Battery Impedance: 6497 Ohm
Battery Remaining Longevity: 2
Brady Statistic AP VP Percent: 44.9 %
Lead Channel Impedance Value: 379 Ohm
Lead Channel Impedance Value: 441 Ohm
Lead Channel Pacing Threshold Amplitude: 1 V
Lead Channel Pacing Threshold Pulse Width: 0.4 ms
Lead Channel Setting Pacing Amplitude: 2 V
Lead Channel Setting Pacing Amplitude: 2.5 V
Lead Channel Setting Sensing Sensitivity: 4 mV
MDC IDC MSMT BATTERY VOLTAGE: 2.63 V
MDC IDC MSMT LEADCHNL RA PACING THRESHOLD AMPLITUDE: 1 V
MDC IDC MSMT LEADCHNL RA PACING THRESHOLD PULSEWIDTH: 0.4 ms
MDC IDC MSMT LEADCHNL RA SENSING INTR AMPL: 1 mV
MDC IDC MSMT LEADCHNL RV SENSING INTR AMPL: 11.2 mV
MDC IDC SET LEADCHNL RV PACING PULSEWIDTH: 0.4 ms
MDC IDC STAT BRADY AP VS PERCENT: 49 %
MDC IDC STAT BRADY AS VP PERCENT: 2 %
MDC IDC STAT BRADY AS VS PERCENT: 4 %

## 2013-08-16 LAB — PACEMAKER DEVICE OBSERVATION

## 2013-08-16 NOTE — Patient Instructions (Signed)
Your physician recommends that you schedule a follow-up appointment in: 2 months with Dr.Croitoru + pacemaker check 

## 2013-08-16 NOTE — Progress Notes (Signed)
Patient ID: Erik Lane, male   DOB: July 09, 1951, 63 y.o.   MRN: [id number]      Reason for office visit Pacemaker followup  [name] is a long-time patient of Dr. Caprice Kluver who is now followed by Dr. Herbie Baltimore for his coronary problems. She also has early onset conduction system disease with a marked sinus bradycardia as well as intermittent AV block. He had a dual-chamber pacemaker implanted in 1996 when he received unipolar leads. His current pacemaker generator was a change out performed in 2003 Psychologist, prison and probation services). His unipolar leads recurrently record artifact ("noise"), but this has not interfered with his device function to date, except for a few brief episodes of unnecessary mode switch. True atrial fibrillation has not been recorded recently, although it is mentioned on past device checks. Although he is not pacemaker dependent he is symptomatic with pacemaker checks due to the significant background sinus bradycardia (around 40-45 beats per minute). He has no complaints today   No Known Allergies  Current Outpatient Prescriptions  Medication Sig Dispense Refill  . aspirin 325 MG EC tablet Take 325 mg by mouth daily.        Marland Kitchen atorvastatin (LIPITOR) 80 MG tablet Take 0.5 tablets (40 mg total) by mouth daily.  15 tablet  11  . digoxin (LANOXIN) 0.125 MG tablet Take 125 mcg by mouth daily.        Marland Kitchen diltiazem (CARDIZEM CD) 180 MG 24 hr capsule Take 180 mg by mouth daily.      . metFORMIN (GLUCOPHAGE-XR) 500 MG 24 hr tablet Take 500 mg by mouth 2 (two) times daily.       No current facility-administered medications for this visit.    Past Medical History  Diagnosis Date  . Symptomatic sinus bradycardia 1996    status post pacemaker placement, exchanged in 2003 ([employer] Sigma, in DDDR)  . CAD in native artery 2005    multivessel CAD, ostial 50-60% LAD, mid LAD 75-80% after D1; OM 2 with 50-60% followed by 60% after OM1. RCA small vessel. --> CABG referral  . S/P CABG x 2 2005      LIMA-LAD, SVG-OM1, seq SVG-OM 2-OM-3  . CAD (coronary artery disease), autologous vein bypass graft 2007    occluded SVG-OM1  . 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 Hypertension   . Paroxysmal atrial flutter     no recent documentation, although frequent mode switches noted but less than 2 minutes interval.  . Dyslipidemia, goal LDL below 70     on statin.  Marland Kitchen Hernia   . Status post cardiac pacemaker procedure 1996, 2003    Medtronic Sigma    Past Surgical History  Procedure Laterality Date  . Coronary artery bypass graft  01/24/2004    LIMA-LAD, SVG-OM1, SVG-OM2/OM3 - Dr. Donata Clay  . Pacemaker insertion  1996 ; 2003    symptomatic bradycardia. generator change 2003 ([employer]tr[employer] Sigma) DDDR  . Hernia repair    . Appendectomy    . Cardiac catheterization  01/17/2004    multivessel disease with osital 50-60% LAD, mid 70-80% after first diagonal, Cfx and OM2 had 50-60% lesion, 60% lesion after OM1 (Dr. Langston Reusing)  . Cardiac catheterization  09/16/2005    SVG to OM1 occluded, other grafts patent, patent RCA, normal LV systolic function (Dr. Mervyn Skeeters. [place])  . Transthoracic echocardiogram  1996    normal LV function,  mild LA dilatation  . Nm myocar perf wall motion  08/2005    mod inferolateral ischemia seen at apex and mid ventricular level, EF 62%    No family history on file.  History   Social History  . Marital Status: Widowed    Spouse Name: N/A    Number of Children: 2  . Years of Education: N/A   Occupational History  .     Social History Main Topics  . Smoking status: Never Smoker   . Smokeless tobacco: Never Used  . Alcohol Use: No  . Drug Use: No  . Sexual Activity: Not on file   Other Topics Concern  . Not on file   Social History Narrative   He is a father of 2, grandfather 50. He does chew tobacco on daily basis but quit smoking  12 years ago. Never drinks a fall. 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    Review of systems: The patient specifically denies any chest pain at rest or with exertion, dyspnea at rest or with exertion, orthopnea, paroxysmal nocturnal dyspnea, syncope, palpitations, focal neurological deficits, intermittent claudication, lower extremity edema, unexplained weight gain, cough, hemoptysis or wheezing.  The patient also denies abdominal pain, nausea, vomiting, dysphagia, diarrhea, constipation, polyuria, polydipsia, dysuria, hematuria, frequency, urgency, abnormal bleeding or bruising, fever, chills, unexpected weight changes, mood swings, change in skin or hair texture, change in voice quality, auditory or visual problems, allergic reactions or rashes, new musculoskeletal complaints other than usual "aches and pains".   PHYSICAL EXAM BP 122/64  Pulse 68  Resp 16  Ht 6\' 1"  (1.854 m)  Wt 95.074 kg (209 lb 9.6 oz)  BMI 27.66 kg/m2  General: Alert, oriented x3, no distress Head: no evidence of trauma, PERRL, EOMI, no exophtalmos or lid lag, no myxedema, no xanthelasma; normal ears, nose and oropharynx Neck: normal jugular venous pulsations and no hepatojugular reflux; brisk carotid pulses without delay and no carotid bruits Chest: clear to auscultation, no signs of consolidation by percussion or palpation, normal fremitus, symmetrical and full respiratory excursions Cardiovascular: normal position and quality of the apical impulse, regular rhythm, normal first and second heart sounds, no murmurs, rubs or gallops; a subclavian pacemaker site is healthy Abdomen: no tenderness or distention, no masses by palpation, no abnormal pulsatility or arterial bruits, normal bowel sounds, no hepatosplenomegaly Extremities: no clubbing, cyanosis or edema; 2+ radial, ulnar and brachial pulses bilaterally; 2+ right femoral, posterior tibial  and dorsalis pedis pulses; 2+ left femoral, posterior tibial and dorsalis pedis pulses; no subclavian or femoral bruits Neurological: grossly nonfocal   EKG: Atrial paced ventricular sensed rhythm with right bundle branch block (chronic)  BMET    Component Value Date/Time   NA 140 06/13/2011 0052   K 4.1 06/13/2011 0052   CL 101 06/13/2011 0052   CO2 28 06/13/2011 0052   GLUCOSE 167* 06/13/2011 0052   BUN 15 06/13/2011 0052   CREATININE 0.86 06/13/2011 0052   CALCIUM 9.7 06/13/2011 0052   GFRNONAA >90 06/13/2011 0052   GFRAA >90 06/13/2011 0052     ASSESSMENT AND PLAN Status post cardiac pacemaker procedure Despite the fact that his pacemaker lead still have satisfactory pacing thresholds and impedances, the frequent episodes of artifact on his unipolar leads are causing some issues. I am uncertain about the accuracy of the diagnosis of atrial flutter, for which he has been prescribed digoxin. Since his device is  approaching the need for elective generator replacement, we discussed the benefit of placing new atrial and ventricular leads with bipolar function at that time. While this would be more involved procedure and would require overnight hospital observation, I think it is a better choice in the long run compared with another simple generator change. When that time comes I recommend first performing a left upper extremity venogram to confirm that the left subclavian vein is still patent. If not an entirely new system would be implanted in the right subclavian area and the chronic leads would be capped.   return for a pacemaker battery check in 2 months  Meds ordered this encounter  Medications  . metFORMIN (GLUCOPHAGE-XR) 500 MG 24 hr tablet    Sig: Take 500 mg by mouth 2 (two) times daily.    Holli Humbles, MD, [place] [phone] office [phone] pager

## 2013-08-16 NOTE — Assessment & Plan Note (Signed)
Despite the fact that his pacemaker lead still have satisfactory pacing thresholds and impedances, the frequent episodes of artifact on his unipolar leads are causing some issues. I am uncertain about the accuracy of the diagnosis of atrial flutter, for which he has been prescribed digoxin. Since his device is approaching the need for elective generator replacement, we discussed the benefit of placing new atrial and ventricular leads with bipolar function at that time. While this would be more involved procedure and would require overnight hospital observation, I think it is a better choice in the long run compared with another simple generator change. When that time comes I recommend first performing a left upper extremity venogram to confirm that the left subclavian vein is still patent. If not an entirely new system would be implanted in the right subclavian area and the chronic leads would be capped.

## 2013-10-10 ENCOUNTER — Encounter (HOSPITAL_COMMUNITY): Payer: Self-pay | Admitting: Pharmacy Technician

## 2013-10-10 ENCOUNTER — Ambulatory Visit (INDEPENDENT_AMBULATORY_CARE_PROVIDER_SITE_OTHER): Payer: No Typology Code available for payment source | Admitting: Cardiology

## 2013-10-10 ENCOUNTER — Ambulatory Visit (INDEPENDENT_AMBULATORY_CARE_PROVIDER_SITE_OTHER): Payer: No Typology Code available for payment source | Admitting: *Deleted

## 2013-10-10 ENCOUNTER — Encounter: Payer: Self-pay | Admitting: Cardiovascular Disease

## 2013-10-10 VITALS — BP 110/80 | HR 65 | Ht 73.0 in | Wt 202.0 lb

## 2013-10-10 DIAGNOSIS — I498 Other specified cardiac arrhythmias: Secondary | ICD-10-CM

## 2013-10-10 DIAGNOSIS — R001 Bradycardia, unspecified: Secondary | ICD-10-CM

## 2013-10-10 DIAGNOSIS — R5381 Other malaise: Secondary | ICD-10-CM

## 2013-10-10 DIAGNOSIS — D689 Coagulation defect, unspecified: Secondary | ICD-10-CM

## 2013-10-10 DIAGNOSIS — R5383 Other fatigue: Secondary | ICD-10-CM

## 2013-10-10 DIAGNOSIS — R531 Weakness: Secondary | ICD-10-CM

## 2013-10-10 DIAGNOSIS — Z01818 Encounter for other preprocedural examination: Secondary | ICD-10-CM

## 2013-10-10 LAB — CBC
HEMATOCRIT: 44.8 % (ref 39.0–52.0)
HEMOGLOBIN: 15.2 g/dL (ref 13.0–17.0)
MCH: 29.3 pg (ref 26.0–34.0)
MCHC: 33.9 g/dL (ref 30.0–36.0)
MCV: 86.5 fL (ref 78.0–100.0)
Platelets: 176 10*3/uL (ref 150–400)
RBC: 5.18 MIL/uL (ref 4.22–5.81)
RDW: 13.6 % (ref 11.5–15.5)
WBC: 8.8 10*3/uL (ref 4.0–10.5)

## 2013-10-10 LAB — MDC_IDC_ENUM_SESS_TYPE_INCLINIC
Battery Voltage: 2.71 V
Brady Statistic RV Percent Paced: 42.4 %
Lead Channel Impedance Value: 353 Ohm
Lead Channel Setting Pacing Amplitude: 2.5 V
Lead Channel Setting Pacing Pulse Width: 0.4 ms
MDC IDC SET LEADCHNL RA PACING AMPLITUDE: 2 V
MDC IDC SET LEADCHNL RV SENSING SENSITIVITY: 4 mV

## 2013-10-10 LAB — PROTIME-INR
INR: 1.07 (ref <1.50)
Prothrombin Time: 13.8 seconds (ref 11.6–15.2)

## 2013-10-10 LAB — APTT: APTT: 29 s (ref 24–37)

## 2013-10-10 NOTE — Patient Instructions (Signed)
Your physician has recommended that you have a pacemaker change. A pacemaker is a small device that is placed under the skin of your chest or abdomen to help control abnormal heart rhythms. This device uses electrical pulses to prompt the heart to beat at a normal rate. Pacemakers are used to treat heart rhythms that are too slow. Wire (leads) are attached to the pacemaker that goes into the chambers of you heart. This is done in the hospital and usually requires and overnight stay. Please see the instruction sheet given to you today for more information. Also The doctor will change out two of the leads.

## 2013-10-10 NOTE — Progress Notes (Signed)
Pacemaker check in clinic with Ellen Henri, PA due to recent fatigue and SOB.  No high ventricular rates noted. Device programmed at appropriate safety margins. Histogram distribution appropriate for patient activity level. ERI reached on 3-1. Paitent symptomatic to VVI 65bpm. Patient will follow up with a gen change on 4-3 with new pacemaker leads.

## 2013-10-11 LAB — BASIC METABOLIC PANEL WITH GFR
BUN: 18 mg/dL (ref 6–23)
CHLORIDE: 104 meq/L (ref 96–112)
CO2: 26 mEq/L (ref 19–32)
Calcium: 9.2 mg/dL (ref 8.4–10.5)
Creat: 0.88 mg/dL (ref 0.50–1.35)
GFR, Est African American: >89 mL/min
Glucose, Bld: 122 mg/dL — ABNORMAL HIGH (ref 70–99)
POTASSIUM: 4.8 meq/L (ref 3.5–5.3)
Sodium: 139 mEq/L (ref 135–145)

## 2013-10-12 ENCOUNTER — Encounter: Payer: Self-pay | Admitting: Cardiology

## 2013-10-12 NOTE — Progress Notes (Addendum)
Patient ID: Erik Lane, male   DOB: [birth year], 63 y.o.   MRN: [id number]    10/12/2013 Erik Lane   [birth year]  [id number]  Primary Physicia [name], MD Primary Cardiologist: Dr. Ellyn Hack Dr. Sallyanne Kuster  HPI:  The patient is a 63 y/o male with a history of CAD, s/p CABG in 2005 (LIMA-LAD, SVG-OM1, seq SVG-OM 2-OM-3 ). Repeat cath in 2007 revealed an occluded SVG-OM1. His coronary disease is followed primarily by Dr. Ellyn Hack. Dr. Sallyanne Kuster follows his pacemaker.  He has early onset conduction system disease with a marked sinus bradycardia as well as intermittent AV block. He had a dual-chamber pacemaker implanted in 1996 when he received unipolar leads. 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   He presents to clinic today with a complaint of recent fatigue and SOB. His device was interogated in the office today.  No high ventricular rates noted. Device programmed at appropriate safety margins. Histogram distribution appropriate for patient activity level. ERI was reached on 09/11/13. Patient was symptomatic to VVI 65bpm. This was reviewed by Dr. Sallyanne Kuster, who felt that pacemaker dysfunction was the likely cause of his symptoms.  Dr. Sallyanne Kuster has recommended a generator change with new pacemaker leads.    Current Outpatient Prescriptions  Medication Sig Dispense Refill  . aspirin 325 MG EC tablet Take 325 mg by mouth daily.        Erik Lane Kitchen atorvastatin (LIPITOR) 80 MG tablet Take 0.5 tablets (40 mg total) by mouth daily.  15 tablet  11  . digoxin (LANOXIN) 0.125 MG tablet Take 125 mcg by mouth daily.        Erik Lane Kitchen diltiazem (CARDIZEM CD) 180 MG 24 hr capsule Take 180 mg by mouth daily.      . metFORMIN (GLUCOPHAGE-XR) 500 MG 24 hr tablet Take 500 mg by mouth 2 (two) times daily.       No current facility-administered medications for this visit.    No Known Allergies  History   Social History  . Marital Status: Widowed    Spouse Name:  N/A    Number of Children: 2  . Years of Education: N/A   Occupational History  .     Social History Main Topics  . Smoking status: Never Smoker   . Smokeless tobacco: Never Used  . Alcohol Use: No  . Drug Use: No  . Sexual Activity: Not on file   Other Topics Concern  . Not on file   Social History Narrative   He is a father of 2, grandfather 29. He does chew tobacco on daily basis but quit smoking 12 years ago. Never drinks a fall. 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     Review of Systems: General: negative for chills, fever, night sweats or weight changes.  Cardiovascular: negative for chest pain, dyspnea on exertion, edema, orthopnea, palpitations, paroxysmal nocturnal dyspnea or shortness of breath Dermatological: negative for rash Respiratory: negative for cough or wheezing Urologic: negative for hematuria Abdominal: negative for nausea, vomiting, diarrhea, bright red blood per rectum, melena, or hematemesis Neurologic: negative for visual changes, syncope, or dizziness All other systems reviewed and are otherwise negative except as noted above.    Blood pressure 110/80, pulse 65, height 6\' 1"  (1.854 m), weight 202 lb (91.627 kg).  General appearance: alert, cooperative and no distress Neck: no carotid bruit and no JVD Lungs: clear to auscultation  bilaterally Heart: regular rate and rhythm, S1, S2 normal, no murmur, click, rub or gallop Extremities: no LEE Pulses: 2+ and symmetric Skin: warm and dry Neurologic: Grossly normal   ASSESSMENT AND PLAN:    pacemaker in situ   No high ventricular rates noted. Device programmed at appropriate safety margins. Histogram distribution appropriate for patient activity level. ERI was reached on 09/11/13. Patient was symptomatic to VVI 65bpm. Will plan for generator change with new pacemaker leads with Dr. Sallyanne Kuster on 10/14/13.     PLAN  Pt's recent  symptoms of fatigue and SOB are likely subsequent to pacemaker dysfunction. Generator change with new pacemaker leads has been scheduled with Dr. Sallyanne Kuster at [facility] on 10/14/13. The patient was seen and examined by Dr. Sallyanne Kuster and he reviewed his interrogation results. He is in agreement to the above plan. The patient has also agreed to undergo the procedure.   [name] 10/12/2013 6:31 PM I have seen and examined the patient along with Lyda Jester, PAC.  I have reviewed the chart, notes and new data.  I agree with PA's note.  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 Needs a Nature conservation officer. He has unipolar leads with worsening parameters - plan to place new bipolar leads at the time of generator changeout. This procedure has been fully reviewed with the patient and written informed consent has been obtained.   Sanda Klein, MD, [place] [phone] 10/13/2013, 11:22 AM

## 2013-10-12 NOTE — Assessment & Plan Note (Signed)
No high ventricular rates noted. Device programmed at appropriate safety margins. Histogram distribution appropriate for patient activity level. ERI was reached on 09/11/13. Patient was symptomatic to VVI 65bpm. Will plan for generator change with new pacemaker leads with Dr. Sallyanne Kuster on 10/14/13.

## 2013-10-13 ENCOUNTER — Other Ambulatory Visit: Payer: Self-pay | Admitting: *Deleted

## 2013-10-13 DIAGNOSIS — Z01812 Encounter for preprocedural laboratory examination: Secondary | ICD-10-CM

## 2013-10-13 MED ORDER — SODIUM CHLORIDE 0.9 % IR SOLN
80.0000 mg | Status: DC
  Filled 2013-10-13: qty 2

## 2013-10-13 MED ORDER — SODIUM CHLORIDE 0.9 % IV SOLN
INTRAVENOUS | Status: DC
  Administered 2013-10-14: 12:00:00 via INTRAVENOUS

## 2013-10-13 MED ORDER — CEFAZOLIN SODIUM-DEXTROSE 2-3 GM-% IV SOLR
2.0000 g | INTRAVENOUS | Status: DC
  Filled 2013-10-13: qty 50

## 2013-10-14 ENCOUNTER — Other Ambulatory Visit: Payer: Self-pay | Admitting: *Deleted

## 2013-10-14 ENCOUNTER — Ambulatory Visit (HOSPITAL_COMMUNITY)
Admission: RE | Admit: 2013-10-14 | Discharge: 2013-10-15 | Disposition: A | Discharge status: Home / Self Care | Payer: No Typology Code available for payment source | Source: Ambulatory Visit | Attending: Cardiovascular Disease | Admitting: Cardiovascular Disease

## 2013-10-14 ENCOUNTER — Encounter (HOSPITAL_COMMUNITY)
Admission: RE | Disposition: A | Discharge status: Home / Self Care | Payer: Self-pay | Source: Ambulatory Visit | Attending: Cardiovascular Disease

## 2013-10-14 DIAGNOSIS — E785 Hyperlipidemia, unspecified: Secondary | ICD-10-CM | POA: Insufficient documentation

## 2013-10-14 DIAGNOSIS — I4891 Unspecified atrial fibrillation: Secondary | ICD-10-CM | POA: Insufficient documentation

## 2013-10-14 DIAGNOSIS — I2510 Atherosclerotic heart disease of native coronary artery without angina pectoris: Secondary | ICD-10-CM | POA: Insufficient documentation

## 2013-10-14 DIAGNOSIS — I441 Atrioventricular block, second degree: Secondary | ICD-10-CM | POA: Insufficient documentation

## 2013-10-14 DIAGNOSIS — R001 Bradycardia, unspecified: Secondary | ICD-10-CM

## 2013-10-14 DIAGNOSIS — Z01812 Encounter for preprocedural laboratory examination: Secondary | ICD-10-CM

## 2013-10-14 DIAGNOSIS — Z950 Presence of cardiac pacemaker: Secondary | ICD-10-CM | POA: Diagnosis present

## 2013-10-14 DIAGNOSIS — I4892 Unspecified atrial flutter: Secondary | ICD-10-CM | POA: Insufficient documentation

## 2013-10-14 DIAGNOSIS — I25810 Atherosclerosis of coronary artery bypass graft(s) without angina pectoris: Secondary | ICD-10-CM | POA: Insufficient documentation

## 2013-10-14 DIAGNOSIS — Z87891 Personal history of nicotine dependence: Secondary | ICD-10-CM | POA: Insufficient documentation

## 2013-10-14 DIAGNOSIS — I10 Essential (primary) hypertension: Secondary | ICD-10-CM | POA: Insufficient documentation

## 2013-10-14 DIAGNOSIS — Z7982 Long term (current) use of aspirin: Secondary | ICD-10-CM | POA: Insufficient documentation

## 2013-10-14 DIAGNOSIS — I495 Sick sinus syndrome: Secondary | ICD-10-CM | POA: Insufficient documentation

## 2013-10-14 DIAGNOSIS — Z45018 Encounter for adjustment and management of other part of cardiac pacemaker: Secondary | ICD-10-CM | POA: Insufficient documentation

## 2013-10-14 HISTORY — PX: GENERATOR REMOVAL: SHX5468

## 2013-10-14 LAB — SURGICAL PCR SCREEN
MRSA, PCR: NEGATIVE
STAPHYLOCOCCUS AUREUS: NEGATIVE

## 2013-10-14 SURGERY — PERMANENT PACEMAKER INSERTION

## 2013-10-14 MED ORDER — ASPIRIN EC 325 MG PO TBEC
325.0000 mg | DELAYED_RELEASE_TABLET | Freq: Every day | ORAL | Status: DC
  Administered 2013-10-14 – 2013-10-15 (×2): 325 mg via ORAL
  Filled 2013-10-14 (×2): qty 1

## 2013-10-14 MED ORDER — LIDOCAINE HCL (PF) 1 % IJ SOLN
INTRAMUSCULAR | Status: AC
  Filled 2013-10-14: qty 60

## 2013-10-14 MED ORDER — CEFAZOLIN SODIUM 1-5 GM-% IV SOLN
1.0000 g | Freq: Four times a day (QID) | INTRAVENOUS | Status: AC
  Administered 2013-10-14 – 2013-10-15 (×3): 1 g via INTRAVENOUS
  Filled 2013-10-14 (×3): qty 50

## 2013-10-14 MED ORDER — SODIUM CHLORIDE 0.9 % IV SOLN
INTRAVENOUS | Status: AC
Start: 2013-10-14 — End: 2013-10-14

## 2013-10-14 MED ORDER — DIGOXIN 125 MCG PO TABS
125.0000 ug | ORAL_TABLET | Freq: Every day | ORAL | Status: DC
  Administered 2013-10-14 – 2013-10-15 (×2): 125 ug via ORAL
  Filled 2013-10-14 (×2): qty 1

## 2013-10-14 MED ORDER — ATORVASTATIN CALCIUM 40 MG PO TABS
40.0000 mg | ORAL_TABLET | Freq: Every day | ORAL | Status: DC
  Administered 2013-10-14: 40 mg via ORAL
  Filled 2013-10-14 (×2): qty 1

## 2013-10-14 MED ORDER — DILTIAZEM HCL ER COATED BEADS 180 MG PO CP24
180.0000 mg | ORAL_CAPSULE | Freq: Every day | ORAL | Status: DC
  Administered 2013-10-14 – 2013-10-15 (×2): 180 mg via ORAL
  Filled 2013-10-14 (×2): qty 1

## 2013-10-14 MED ORDER — YOU HAVE A PACEMAKER BOOK
Freq: Once | Status: AC
  Administered 2013-10-14: 20:00:00
  Filled 2013-10-14: qty 1

## 2013-10-14 MED ORDER — MUPIROCIN 2 % EX OINT
TOPICAL_OINTMENT | CUTANEOUS | Status: AC
  Administered 2013-10-14: 1 via NASAL
  Filled 2013-10-14: qty 22

## 2013-10-14 MED ORDER — MIDAZOLAM HCL 5 MG/5ML IJ SOLN
INTRAMUSCULAR | Status: AC
  Filled 2013-10-14: qty 5

## 2013-10-14 MED ORDER — FENTANYL CITRATE 0.05 MG/ML IJ SOLN
INTRAMUSCULAR | Status: AC
  Filled 2013-10-14: qty 2

## 2013-10-14 MED ORDER — MUPIROCIN 2 % EX OINT
TOPICAL_OINTMENT | Freq: Two times a day (BID) | CUTANEOUS | Status: DC
  Administered 2013-10-14: 1 via NASAL

## 2013-10-14 MED ORDER — METFORMIN HCL ER 500 MG PO TB24
500.0000 mg | ORAL_TABLET | Freq: Two times a day (BID) | ORAL | Status: DC
  Filled 2013-10-14 (×4): qty 1

## 2013-10-14 MED ORDER — ONDANSETRON HCL 4 MG/2ML IJ SOLN: 4.0000 mg | Freq: Four times a day (QID) | INTRAMUSCULAR | Status: DC | PRN

## 2013-10-14 MED ORDER — ACETAMINOPHEN 325 MG PO TABS: 325.0000 mg | ORAL_TABLET | ORAL | Status: DC | PRN

## 2013-10-14 MED ORDER — ASPIRIN EC 325 MG PO TBEC: 325.0000 mg | DELAYED_RELEASE_TABLET | Freq: Every day | ORAL | Status: DC

## 2013-10-14 MED ORDER — HEPARIN (PORCINE) IN NACL 2-0.9 UNIT/ML-% IJ SOLN
INTRAMUSCULAR | Status: AC
  Filled 2013-10-14: qty 1000

## 2013-10-14 MED ORDER — SODIUM CHLORIDE 0.9 % IR SOLN
Freq: Once | Status: DC
  Filled 2013-10-14: qty 2

## 2013-10-14 MED ORDER — HYDROCODONE-ACETAMINOPHEN 5-325 MG PO TABS: 1.0000 | ORAL_TABLET | ORAL | Status: DC | PRN

## 2013-10-14 NOTE — Interval H&P Note (Signed)
History and Physical Interval Note:  10/14/2013 12:51 PM  Erik Lane  has presented today for surgery, with the diagnosis of Battery depletion and Lead failure   The various methods of treatment have been discussed with the patient and family. After consideration of risks, benefits and other options for treatment, the patient has consented to  Procedure(s): PERMANENT PACEMAKER GENERATOR CHANGE (N/A) LEAD REVISION (N/A) as a surgical intervention .  The patient's history has been reviewed, patient examined, no change in status, stable for surgery.  I have reviewed the patient's chart and labs.  Questions were answered to the patient's satisfaction.     [name]

## 2013-10-14 NOTE — CV Procedure (Signed)
Procedure report  Procedure performed:  1. Implantation of new dual chamber permanent pacemaker 2. Fluoroscopy 3. Light sedation 4. Removal of old pacemaker generator, contralateral; chronic unipolar leads capped  Reason for procedure: Symptomatic bradycardia due to: Sinus node dysfunction Tachycardia-bradycardia syndrome Bradycardia due to necessary medications Second degree atrioventricular block Mobitz  II Paroxysmal atrial fibrillation  Procedure performed by: Sanda Klein, MD  Complications: None  Estimated blood loss: <10 mL  Medications administered during procedure: Ancef 2 g intravenously Lidocaine 1% 50 mL locally,  Fentanyl 100 mcg intravenously Versed 5 mg intravenously Omnipaque 50 mL IV for venography  Device details: Generator Medtronic Adapta L model ADDRL01 serial number [id number] H Right atrial lead Medtronic [id number] serial number [id number] Right ventricular lead Medtronic 5076 serial number [id number]  Procedure details:  The patient has 63 year old unipolar lead with unsatisfactory pace/sense parameters. The generator has reached ERI. 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 The right subclavian vein was patent and an entire new system was implanted from the right side, with removal of the depleted left sided generator. The chronic unipolar leads were capped and left in place.  After the risks and benefits of the procedure were discussed the patient provided informed consent and was brought to the cardiac cath lab in the fasting state. The patient was prepped and draped in usual sterile fashion. Local anesthesia with 1% lidocaine was administered to to the right infraclavicular area. A 5-6 cm horizontal incision was made parallel with and 2-3 cm caudal to the right clavicle. Using electrocautery and blunt dissection a prepectoral pocket was created down to the level of the pectoralis major muscle fascia. The pocket was carefully  inspected for hemostasis. An antibiotic-soaked sponge was placed in the pocket.  Under fluoroscopic guidance and using the modified Seldinger technique 2 separate venipunctures were performed to access the left subclavian vein. No difficulty was encountered accessing the vein.  Two J-tip guidewires were subsequently exchanged for two 73F French safe sheaths.  Under fluoroscopic guidance the ventricular lead was advanced to level of the mid to apical right ventricular septum and thet active-fixation helix was deployed. Prominent current of injury was seen. Satisfactory pacing and sensing parameters were recorded. There was no evidence of diaphragmatic stimulation at maximum device output. The safe sheath was peeled away and the lead was secured in place with 2-0 silk.  In similar fashion the right atrial lead was advanced to the level of the atrial appendage. The active-fixation helix was deployed. There was prominent current of injury. Satisfactory  pacing and sensing parameters were recorded. There was no evidence of diaphragmatic stimulation with pacing at maximum device output. The safe sheath was peeled away and the lead was secured in place with 2-0 silk.  The antibiotic-soaked sponge was removed from the pocket. The pocket was flushed with copious amounts of antibiotic solution. Reinspection showed excellent hemostasis..  The ventricular lead was connected to the generator and appropriate ventricular pacing was seen. Subsequently the atrial lead was also connected. Repeat testing of the lead parameters later showed excellent values.  The entire system was then carefully inserted in the pocket with care been taking that the leads and device assumed a comfortable position without pressure on the incision. Great care was taken that the leads be located deep to the generator. The pocket was then closed in layers using 2 layers of 2-0 Vicryl and cutaneous staples, after which a sterile dressing was  applied.  At the end of the procedure  the following lead parameters were encountered:  Right atrial lead  sensed P waves 1.5 mV, impedance 1007 ohms, threshold 0.5 V at 0.5 ms pulse width.  Right ventricular lead sensed R waves 7.4 mV, impedance 816ohms, threshold 1.0 V at 0.5 ms pulse width.  Subsequently, the left infraclavicular area was infiltrated with lidocaine and the pocket was opened and the old generator was removed. The capsule was very thick. The old leads were capped securely. The pocket was flushed with copious amounts of antibiotic solution. Reinspection showed excellent hemostasis. The pocket was then closed in layers using 2 layers of 2-0 Vicryl and cutaneous staples, after which a sterile dressing was applied.   Sanda Klein, MD, [facility] [phone] office [phone] pager

## 2013-10-14 NOTE — H&P (View-Only) (Signed)
Patient ID: Erik Lane, male   DOB: [birth year], 63 y.o.   MRN: [id number]    10/12/2013 ASKARI KINLEY   02/27/1951  [id number]  Primary Physicia [name], MD Primary Cardiologist: Dr. Ellyn Hack Dr. Sallyanne Kuster  HPI:  The patient is a 63 y/o male with a history of CAD, s/p CABG in 2005 (LIMA-LAD, SVG-OM1, seq SVG-OM 2-OM-3 ). Repeat cath in 2007 revealed an occluded SVG-OM1. His coronary disease is followed primarily by Dr. Ellyn Hack. Dr. Sallyanne Kuster follows his pacemaker.  He has early onset conduction system disease with a marked sinus bradycardia as well as intermittent AV block. He had a dual-chamber pacemaker implanted in 1996 when he received unipolar leads. 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   He presents to clinic today with a complaint of recent fatigue and SOB. His device was interogated in the office today.  No high ventricular rates noted. Device programmed at appropriate safety margins. Histogram distribution appropriate for patient activity level. ERI was reached on 09/11/13. Patient was symptomatic to VVI 65bpm. This was reviewed by Dr. Sallyanne Kuster, who felt that pacemaker dysfunction was the likely cause of his symptoms.  Dr. Sallyanne Kuster has recommended a generator change with new pacemaker leads.    Current Outpatient Prescriptions  Medication Sig Dispense Refill  . aspirin 325 MG EC tablet Take 325 mg by mouth daily.        Marland Kitchen atorvastatin (LIPITOR) 80 MG tablet Take 0.5 tablets (40 mg total) by mouth daily.  15 tablet  11  . digoxin (LANOXIN) 0.125 MG tablet Take 125 mcg by mouth daily.        Marland Kitchen diltiazem (CARDIZEM CD) 180 MG 24 hr capsule Take 180 mg by mouth daily.      . metFORMIN (GLUCOPHAGE-XR) 500 MG 24 hr tablet Take 500 mg by mouth 2 (two) times daily.       No current facility-administered medications for this visit.    No Known Allergies  History   Social History  . Marital Status: Widowed    Spouse Name:  N/A    Number of Children: 2  . Years of Education: N/A   Occupational History  .     Social History Main Topics  . Smoking status: Never Smoker   . Smokeless tobacco: Never Used  . Alcohol Use: No  . Drug Use: No  . Sexual Activity: Not on file   Other Topics Concern  . Not on file   Social History Narrative   He is a father of 2, grandfather 38. He does chew tobacco on daily basis but quit smoking 12 years ago. Never drinks a fall. 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     Review of Systems: General: negative for chills, fever, night sweats or weight changes.  Cardiovascular: negative for chest pain, dyspnea on exertion, edema, orthopnea, palpitations, paroxysmal nocturnal dyspnea or shortness of breath Dermatological: negative for rash Respiratory: negative for cough or wheezing Urologic: negative for hematuria Abdominal: negative for nausea, vomiting, diarrhea, bright red blood per rectum, melena, or hematemesis Neurologic: negative for visual changes, syncope, or dizziness All other systems reviewed and are otherwise negative except as noted above.    Blood pressure 110/80, pulse 65, height 6\' 1"  (1.854 m), weight 202 lb (91.627 kg).  General appearance: alert, cooperative and no distress Neck: no carotid bruit and no JVD Lungs: clear to auscultation  bilaterally Heart: regular rate and rhythm, S1, S2 normal, no murmur, click, rub or gallop Extremities: no LEE Pulses: 2+ and symmetric Skin: warm and dry Neurologic: Grossly normal   ASSESSMENT AND PLAN:    pacemaker in situ   No high ventricular rates noted. Device programmed at appropriate safety margins. Histogram distribution appropriate for patient activity level. ERI was reached on 09/11/13. Patient was symptomatic to VVI 65bpm. Will plan for generator change with new pacemaker leads with Dr. Sallyanne Kuster on 10/14/13.     PLAN  Pt's recent  symptoms of fatigue and SOB are likely subsequent to pacemaker dysfunction. Generator change with new pacemaker leads has been scheduled with Dr. Sallyanne Kuster at [facility] on 10/14/13. The patient was seen and examined by Dr. Sallyanne Kuster and he reviewed his interrogation results. He is in agreement to the above plan. The patient has also agreed to undergo the procedure.   [name] 10/12/2013 6:31 PM I have seen and examined the patient along with Lyda Jester, PAC.  I have reviewed the chart, notes and new data.  I agree with PA's note.  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 Needs a Nature conservation officer. He has unipolar leads with worsening parameters - plan to place new bipolar leads at the time of generator changeout. This procedure has been fully reviewed with the patient and written informed consent has been obtained.   Sanda Klein, MD, [place] [phone] 10/13/2013, 11:22 AM

## 2013-10-14 NOTE — Discharge Instructions (Signed)

## 2013-10-15 ENCOUNTER — Encounter (HOSPITAL_COMMUNITY): Payer: Self-pay | Admitting: *Deleted

## 2013-10-15 ENCOUNTER — Ambulatory Visit (HOSPITAL_COMMUNITY): Payer: No Typology Code available for payment source

## 2013-10-15 MED ORDER — METFORMIN HCL ER 500 MG PO TB24: 500.0000 mg | ORAL_TABLET | Freq: Two times a day (BID) | ORAL | Status: DC

## 2013-10-15 NOTE — Discharge Summary (Signed)
ELECTROPHYSIOLOGY PROCEDURE DISCHARGE SUMMARY    Patient ID: Erik Lane,  MRN: [id number], DOB/AGE: [birth year] 63 y.o.  Admit date: 10/14/2013 Discharge date: 10/15/2013  Primary Care Physician: [name], MD Primary Cardiologist: [facility]  Primary Discharge Diagnosis:  Symptomatic bradycardia with previously implanted unipolar leads - capped this admission with new pacing system implanted on contralateral side.   Secondary Discharge Diagnosis:  1.  CAD - s/p CABG 2005 2.  Hypertension 3.  Paroxysmal atrial flutter 4.  Dyslipidemia   No Known Allergies   Procedures This Admission:  1.  Capping of previously implanted left sided unipolar pacing leads and removal of right sided pulse generator; implantation of new dual chamber pacing system on right side on 10-14-13 by Dr Sallyanne Kuster.  The patient received a MDT ADDRL1 pacemaker with model number 5076 right atrial and right ventricular leads.  There were no early apparent complications.  2.  CXR on 10-15-13 demonstrated stable lead position and no ptx.  Brief HPI: Erik Lane is a 63 y.o. male with a past medical history as listed above.  He has symptomatic bradycardia and is status post pacemaker implant originally in 1996 with subsequent generator change in 2003.  His device was recently found to have reached elective replacement indicator.  There was indication of impending lead failure and new pacing system implantation was recommended with capping of previously implanted leads.  Risks, benefits, and alternatives were reviewed with the patient who wished to proceed.   Hospital Course:  The patient was admitted and underwent implantation of a Medtronic dual chamber pacemaker and capping of previously implanted unipolar leads with details as outlined above.   He was monitored on telemetry overnight which demonstrated AV pacing.  Left and right chest was without hematoma or ecchymosis.  The device was  interrogated and found to be functioning normally.  CXR was obtained and demonstrated no pneumothorax status post device implantation.  Wound care, arm mobility, and restrictions were reviewed with the patient.  Dr Lovena Le examined the patient and considered them stable for discharge to home.    Discharge Vitals: Blood pressure 122/71, pulse 72, temperature 97.6 F (36.4 C), temperature source Oral, resp. rate 18, height 6\' 1"  (1.854 m), weight 205 lb 4 oz (93.1 kg), SpO2 95.00%.  Labs:   Lab Results  Component Value Date   WBC 8.8 10/10/2013   HGB 15.2 10/10/2013   HCT 44.8 10/10/2013   MCV 86.5 10/10/2013   PLT 176 10/10/2013     Recent Labs Lab 10/10/13 1208  NA 139  K 4.8  CL 104  CO2 26  BUN 18  CREATININE 0.88  CALCIUM 9.2  GLUCOSE 122*     Discharge Medications:    Medication List    ASK your doctor about these medications       aspirin 325 MG EC tablet  Take 325 mg by mouth daily.     atorvastatin 80 MG tablet  Commonly known as:  LIPITOR  Take 0.5 tablets (40 mg total) by mouth daily.     digoxin 0.125 MG tablet  Commonly known as:  LANOXIN  Take 125 mcg by mouth daily.     diltiazem 180 MG 24 hr capsule  Commonly known as:  CARDIZEM CD  Take 180 mg by mouth daily.     metFORMIN 500 MG 24 hr tablet  Commonly known as:  GLUCOPHAGE-XR  Take 500 mg by mouth 2 (two) times daily.  Disposition:   Future Appointments Provider Department Dept Phone   10/24/2013 4:00 PM Sanda Klein, MD [facility] [phone]     Follow-up Information   Follow up with [facility], MD. (Monday, April 13th as previously scheduled)    Specialty:  Cardiology   Contact information:   [address] [place] [address] [phone]       Duration of Discharge Encounter: less than 30 minutes including physician time.  Signed, Mikle Bosworth.D.

## 2013-10-15 NOTE — Progress Notes (Signed)
Per d/w Dr. Lovena Le, pt is to resume Metformin tomorrow AM. This was communicated on his AVS. durable medical equipment

## 2013-10-17 ENCOUNTER — Encounter: Payer: No Typology Code available for payment source | Admitting: Cardiovascular Disease

## 2013-10-21 ENCOUNTER — Telehealth: Payer: Self-pay | Admitting: Cardiovascular Disease

## 2013-10-24 ENCOUNTER — Telehealth: Payer: Self-pay | Admitting: *Deleted

## 2013-10-24 ENCOUNTER — Encounter: Payer: No Typology Code available for payment source | Admitting: Cardiovascular Disease

## 2013-10-24 NOTE — Telephone Encounter (Signed)
Returned call and pt verified x 2.  Pt stated he got his appt situated and didn't need RN.  Stated he is coming on Wed at 2:20 pm to have the staples removed and on May 15th to see Dr. Loletha Grayer.  No further action taken.  Pt did ask about CXR and informed he had one on 4.4.15 and should not need another.  Advised he discuss at appt on Wednesday and if any tests needed they will be ordered then.  Pt verbalized understanding and agreed w/ plan.

## 2013-10-24 NOTE — Telephone Encounter (Signed)
Pt was calling in regards to his appointment. He thought that it was today to have his staples removed. He wanted to know why he had to wait until May, also Dr. Loletha Grayer is supposed to give him a release to go back to work for Monday.  [place]

## 2013-10-26 ENCOUNTER — Encounter: Payer: Self-pay | Admitting: Cardiology

## 2013-10-26 ENCOUNTER — Ambulatory Visit (INDEPENDENT_AMBULATORY_CARE_PROVIDER_SITE_OTHER): Payer: No Typology Code available for payment source | Admitting: Cardiology

## 2013-10-26 VITALS — BP 130/70 | HR 80 | Ht 73.0 in | Wt 203.0 lb

## 2013-10-26 DIAGNOSIS — Z950 Presence of cardiac pacemaker: Secondary | ICD-10-CM

## 2013-10-26 NOTE — Progress Notes (Signed)
10/26/2013 Erik Lane   Oct 08, 1950  [id number]  Primary Physicia [name], MD Primary Cardiologist: Dr Erik Lane  HPI:  Erik Lane is a 62 y.o. male with a past medical history as listed above. He has symptomatic bradycardia and is status post pacemaker implant originally in 1996 with subsequent generator change in 2003. His device was recently found to have reached elective replacement indicator. There was indication of impending lead failure and new pacing system implantation was recommended with capping of previously implanted leads. Risks, benefits, and alternatives were reviewed with the patient who wished to proceed.            The patient was admitted and underwent implantation of a Medtronic dual chamber pacemaker and capping of previously implanted unipolar leads with details as outlined above. He was monitored on telemetry overnight which demonstrated AV pacing. Left and right chest was without hematoma or ecchymosis. The device was interrogated and found to be functioning normally. CXR was obtained and demonstrated no pneumothorax status post device implantation. Wound care, arm mobility, and restrictions were reviewed with the patient. He is in the ofice today for staple removal.   Current Outpatient Prescriptions  Medication Sig Dispense Refill  . aspirin 325 MG EC tablet Take 325 mg by mouth daily.        Marland Kitchen atorvastatin (LIPITOR) 80 MG tablet Take 0.5 tablets (40 mg total) by mouth daily.  15 tablet  11  . digoxin (LANOXIN) 0.125 MG tablet Take 125 mcg by mouth daily.        Marland Kitchen diltiazem (CARDIZEM CD) 180 MG 24 hr capsule Take 180 mg by mouth daily.      . metFORMIN (GLUCOPHAGE-XR) 500 MG 24 hr tablet Take 1 tablet (500 mg total) by mouth 2 (two) times daily.      Marland Kitchen NITROSTAT 0.4 MG SL tablet Place 1 tablet under the tongue every 15 (fifteen) minutes as needed.       No current facility-administered medications for this visit.    No Known  Allergies  History   Social History  . Marital Status: Widowed    Spouse Name: N/A    Number of Children: 2  . Years of Education: N/A   Occupational History  .     Social History Main Topics  . Smoking status: Never Smoker   . Smokeless tobacco: Never Used  . Alcohol Use: No  . Drug Use: No  . Sexual Activity: Not on file   Other Topics Concern  . Not on file   Social History Narrative   He is a father of 2, grandfather 65. He does chew tobacco on daily basis but quit smoking 12 years ago. Never drinks a fall. 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     Review of Systems: General: negative for chills, fever, night sweats or weight changes.  Cardiovascular: negative for chest pain, dyspnea on exertion, edema, orthopnea, palpitations, paroxysmal nocturnal dyspnea or shortness of breath Dermatological: negative for rash Respiratory: negative for cough or wheezing Urologic: negative for hematuria Abdominal: negative for nausea, vomiting, diarrhea, bright red blood per rectum, melena, or hematemesis Neurologic: negative for visual changes, syncope, or dizziness All other systems reviewed and are otherwise negative except as noted above.    Blood pressure 130/70, pulse 80, height 6\' 1"  (1.854 m), weight 203 lb (92.08 kg).  General appearance: alert, cooperative and no distress Pacer sites  without hematoma, echymosis, or signs of infection    ASSESSMENT AND PLAN:   Status post cardiac pacemaker procedure S/P gen change 10/14/13   PLAN  Staple removed from both Rt and Lt chest site.  Doreene Burke [facility] 10/26/2013 2:39 PM

## 2013-10-26 NOTE — Assessment & Plan Note (Signed)
S/P gen change 10/14/13

## 2013-11-03 NOTE — Telephone Encounter (Signed)
Closed encounter °

## 2013-11-25 ENCOUNTER — Encounter: Payer: Self-pay | Admitting: Cardiovascular Disease

## 2013-11-25 ENCOUNTER — Ambulatory Visit (INDEPENDENT_AMBULATORY_CARE_PROVIDER_SITE_OTHER): Payer: No Typology Code available for payment source | Admitting: Cardiovascular Disease

## 2013-11-25 VITALS — BP 127/65 | HR 65 | Resp 16 | Ht 73.0 in | Wt 200.5 lb

## 2013-11-25 DIAGNOSIS — R001 Bradycardia, unspecified: Secondary | ICD-10-CM

## 2013-11-25 DIAGNOSIS — I4892 Unspecified atrial flutter: Secondary | ICD-10-CM

## 2013-11-25 DIAGNOSIS — I498 Other specified cardiac arrhythmias: Secondary | ICD-10-CM

## 2013-11-25 DIAGNOSIS — Z950 Presence of cardiac pacemaker: Secondary | ICD-10-CM

## 2013-11-25 DIAGNOSIS — Z951 Presence of aortocoronary bypass graft: Secondary | ICD-10-CM

## 2013-11-25 DIAGNOSIS — I2510 Atherosclerotic heart disease of native coronary artery without angina pectoris: Secondary | ICD-10-CM

## 2013-11-25 LAB — PACEMAKER DEVICE OBSERVATION

## 2013-11-25 NOTE — Assessment & Plan Note (Signed)
True atrial tachy arrhythmia has not been recorded in a long time. Stop digoxin.

## 2013-11-25 NOTE — Patient Instructions (Signed)
Your physician has recommended making the following medication changes:  STOP Digoxin  Remote monitoring is used to monitor your Pacemaker of ICD from home. This monitoring reduces the number of office visits required to check your device to one time per year. It allows [place] to keep an eye on the functioning of your device to ensure it is working properly. You are scheduled for a device check from home on August 20th, 2015. You may send your transmission at any time that day. If you have a wireless device, the transmission will be sent automatically. After your physician reviews your transmission, you will receive a postcard with your next transmission date.  Dr Sallyanne Kuster wants you to follow-up in 1 year. You will receive a reminder letter in the mail one months in advance. If you don't receive a letter, please call our office to schedule the follow-up appointment.

## 2013-11-25 NOTE — Progress Notes (Signed)
Patient ID: Erik Lane, male   DOB: November 07, 1950, 63 y.o.   MRN: [id number]      Reason for office visit CAD s/p CABG, paroxysmal atrial flutter, pacemaker  Erik Lane returns in followup roughly 6 weeks following implantation of a new pacemaker system. He initially received a dual-chamber permanent pacemaker 1996 with unipolar leads, with a generator change in 2003. His unipolar leads recorded a lot of "noise" and we placed a entirely new system with bipolar leads in the new generator. Device function today is normal. Outputs reduced to chronic settings. There has been no recording of true atrial tachyarrhythmia. Has a long-standing history of coronary disease undergoing bypass surgery in 2005. He has preserved left ventricular systolic function and no history of congestive heart failure. He has a reported history of paroxysmal atrial flutter but none has been recorded recently. Most of the episodes of mode switch of his device had reported were related to "noise" on the atrial channel.    No Known Allergies  Current Outpatient Prescriptions  Medication Sig Dispense Refill  . aspirin 325 MG EC tablet Take 325 mg by mouth daily.        Marland Kitchen atorvastatin (LIPITOR) 80 MG tablet Take 0.5 tablets (40 mg total) by mouth daily.  15 tablet  11  . diltiazem (CARDIZEM CD) 180 MG 24 hr capsule Take 180 mg by mouth daily.      . metFORMIN (GLUCOPHAGE-XR) 500 MG 24 hr tablet Take 1 tablet (500 mg total) by mouth 2 (two) times daily.      Marland Kitchen NITROSTAT 0.4 MG SL tablet Place 1 tablet under the tongue every 15 (fifteen) minutes as needed.       No current facility-administered medications for this visit.    Past Medical History  Diagnosis Date  . Symptomatic sinus bradycardia 1996    status post pacemaker placement, exchanged in 2003 (Medtronic Sigma, in DDDR)  . CAD in native artery 2005    multivessel CAD, ostial 50-60% LAD, mid LAD 75-80% after D1; OM 2 with 50-60% followed by 60% after OM1. RCA small  vessel. --> CABG referral  . S/P CABG x 2 2005    LIMA-LAD, SVG-OM1, seq SVG-OM 2-OM-3  . CAD (coronary artery disease), autologous vein bypass graft 2007    occluded SVG-OM1  . 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 Hypertension   . Paroxysmal atrial flutter     no recent documentation, although frequent mode switches noted but less than 2 minutes interval.  . Dyslipidemia, goal LDL below 70     on statin.  Marland Kitchen Hernia   . Status post cardiac pacemaker procedure 1996, 2003; 10-2013    Medtronic Addapta L     Past Surgical History  Procedure Laterality Date  . Coronary artery bypass graft  01/24/2004    LIMA-LAD, SVG-OM1, SVG-OM2/OM3 - Dr. Prescott Lane  . Pacemaker insertion  1996 ; 2003; 10-14-13    symptomatic bradycardia. generator change 2003 (Medtronic Sigma) DDDR; left sided device explanted 10-14-13 with new pacing system implatned on right side - MDT ADDRL1 by Dr Erik Lane  . Hernia repair    . Appendectomy    . Cardiac catheterization  01/17/2004    multivessel disease with osital 50-60% LAD, mid 70-80% after first diagonal, Cfx and OM2 had 50-60% lesion, 60% lesion after OM1 (Dr. Rockne Lane)  . Cardiac catheterization  09/16/2005  SVG to OM1 occluded, other grafts patent, patent RCA, normal LV systolic function (Dr. Loni Lane. Erik Lane)  . Transthoracic echocardiogram  1996    normal LV function, mild LA dilatation  . Nm myocar perf wall motion  08/2005    mod inferolateral ischemia seen at apex and mid ventricular level, EF 62%    No family history on file.  History   Social History  . Marital Status: Widowed    Spouse Name: N/A    Number of Children: 2  . Years of Education: N/A   Occupational History  .     Social History Main Topics  . Smoking status: Never Smoker   . Smokeless tobacco: Never Used  . Alcohol Use: No  . Drug Use: No  . Sexual  Activity: Not on file   Other Topics Concern  . Not on file   Social History Narrative   He is a father of 2, grandfather 86. He does chew tobacco on daily basis but quit smoking 12 years ago. Never drinks a fall. 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    Review of systems: The patient specifically denies any chest pain at rest or with exertion, dyspnea at rest or with exertion, orthopnea, paroxysmal nocturnal dyspnea, syncope, palpitations, focal neurological deficits, intermittent claudication, lower extremity edema, unexplained weight gain, cough, hemoptysis or wheezing.  The patient also denies abdominal pain, nausea, vomiting, dysphagia, diarrhea, constipation, polyuria, polydipsia, dysuria, hematuria, frequency, urgency, abnormal bleeding or bruising, fever, chills, unexpected weight changes, mood swings, change in skin or hair texture, change in voice quality, auditory or visual problems, allergic reactions or rashes, new musculoskeletal complaints other than usual "aches and pains".   PHYSICAL EXAM BP 127/65  Pulse 65  Resp 16  Ht 6\' 1"  (1.854 m)  Wt 200 lb 8 oz (90.946 kg)  BMI 26.46 kg/m2  General: Alert, oriented x3, no distress Head: no evidence of trauma, PERRL, EOMI, no exophtalmos or lid lag, no myxedema, no xanthelasma; normal ears, nose and oropharynx Neck: normal jugular venous pulsations and no hepatojugular reflux; brisk carotid pulses without delay and no carotid bruits Chest: clear to auscultation, no signs of consolidation by percussion or palpation, normal fremitus, symmetrical and full respiratory excursions, well healed right subclavian pacemaker site Cardiovascular: normal position and quality of the apical impulse, regular rhythm, normal first and second heart sounds, no murmurs, rubs or gallops Abdomen: no tenderness or distention, no masses by palpation, no abnormal pulsatility or  arterial bruits, normal bowel sounds, no hepatosplenomegaly Extremities: no clubbing, cyanosis or edema; 2+ radial, ulnar and brachial pulses bilaterally; 2+ right femoral, posterior tibial and dorsalis pedis pulses; 2+ left femoral, posterior tibial and dorsalis pedis pulses; no subclavian or femoral bruits Neurological: grossly nonfocal   EKG: Pacemaker interrogation shows atrial paced ventricular sensed rhythm 94% of the time. There is very infrequent ventricular pacing, only 1% of the time. No episodes of atrial flutter or atrial fibrillation have been recorded. Lead parameters are satisfactory.  BMET    Component Value Date/Time   NA 139 10/10/2013 1208   K 4.8 10/10/2013 1208   CL 104 10/10/2013 1208   CO2 26 10/10/2013 1208   GLUCOSE 122* 10/10/2013 1208   BUN 18 10/10/2013 1208   CREATININE 0.88 10/10/2013 1208   CREATININE 0.86 06/13/2011 0052   CALCIUM 9.2 10/10/2013 1208   GFRNONAA >89 10/10/2013 1208   GFRNONAA >90 06/13/2011 0052  GFRAA >89 10/10/2013 1208   GFRAA >90 06/13/2011 0052     ASSESSMENT AND PLAN Paroxysmal atrial flutter True atrial tachy arrhythmia has not been recorded in a long time. Stop digoxin.  CAD in native artery No symptoms of coronary insufficiency at this time. Followup with Dr. Ellyn Hack as previously scheduled.  Status post cardiac pacemaker procedure Normal pacemaker function. CareLink monitor download every 3 months. Followup in the office in 12 months.  S/P CABG x 4 : LIMA-LAD, SVG-OM1 (known to be occluded), seq SVG-OM 2-OM 3      Aashika Carta  Sanda Klein, MD, [facility] [phone] office [phone] pager

## 2013-11-25 NOTE — Assessment & Plan Note (Signed)
Normal pacemaker function. CareLink monitor download every 3 months. Followup in the office in 12 months.

## 2013-11-25 NOTE — Assessment & Plan Note (Signed)
No symptoms of coronary insufficiency at this time. Followup with Dr. Ellyn Hack as previously scheduled.

## 2014-02-08 ENCOUNTER — Encounter: Payer: Self-pay | Admitting: Cardiovascular Disease

## 2014-03-01 DIAGNOSIS — I498 Other specified cardiac arrhythmias: Secondary | ICD-10-CM

## 2014-03-01 LAB — MDC_IDC_ENUM_SESS_TYPE_REMOTE
Brady Statistic AS VS Percent: 10 %
Date Time Interrogation Session: 20150819165155
Lead Channel Impedance Value: 660 Ohm
Lead Channel Pacing Threshold Amplitude: 0.625 V
Lead Channel Pacing Threshold Pulse Width: 0.4 ms
Lead Channel Sensing Intrinsic Amplitude: 11.2 mV
Lead Channel Setting Sensing Sensitivity: 2 mV
MDC IDC MSMT BATTERY IMPEDANCE: 100 Ohm
MDC IDC MSMT BATTERY REMAINING LONGEVITY: 164 mo
MDC IDC MSMT BATTERY VOLTAGE: 2.8 V
MDC IDC MSMT LEADCHNL RA IMPEDANCE VALUE: 522 Ohm
MDC IDC MSMT LEADCHNL RA PACING THRESHOLD AMPLITUDE: 0.5 V
MDC IDC MSMT LEADCHNL RA PACING THRESHOLD PULSEWIDTH: 0.4 ms
MDC IDC SET LEADCHNL RA PACING AMPLITUDE: 1.5 V
MDC IDC SET LEADCHNL RV PACING AMPLITUDE: 2 V
MDC IDC SET LEADCHNL RV PACING PULSEWIDTH: 0.4 ms
MDC IDC STAT BRADY AP VP PERCENT: 1 %
MDC IDC STAT BRADY AP VS PERCENT: 89 %
MDC IDC STAT BRADY AS VP PERCENT: 0 %

## 2014-03-02 ENCOUNTER — Ambulatory Visit (INDEPENDENT_AMBULATORY_CARE_PROVIDER_SITE_OTHER): Payer: No Typology Code available for payment source | Admitting: *Deleted

## 2014-03-02 DIAGNOSIS — R001 Bradycardia, unspecified: Secondary | ICD-10-CM

## 2014-03-02 NOTE — Progress Notes (Signed)
Remote pacemaker transmission.   

## 2014-03-14 ENCOUNTER — Encounter: Payer: Self-pay | Admitting: Cardiology

## 2014-03-17 ENCOUNTER — Encounter: Payer: Self-pay | Admitting: Cardiovascular Disease

## 2014-04-28 ENCOUNTER — Other Ambulatory Visit: Payer: Self-pay | Admitting: Cardiology

## 2014-04-28 NOTE — Telephone Encounter (Signed)
Rx was sent to pharmacy electronically. 

## 2014-06-05 ENCOUNTER — Ambulatory Visit (INDEPENDENT_AMBULATORY_CARE_PROVIDER_SITE_OTHER): Payer: No Typology Code available for payment source | Admitting: Cardiology

## 2014-06-05 ENCOUNTER — Ambulatory Visit (INDEPENDENT_AMBULATORY_CARE_PROVIDER_SITE_OTHER): Payer: No Typology Code available for payment source | Admitting: *Deleted

## 2014-06-05 ENCOUNTER — Encounter: Payer: Self-pay | Admitting: Cardiology

## 2014-06-05 VITALS — BP 139/85 | HR 64 | Ht 73.0 in | Wt 202.3 lb

## 2014-06-05 DIAGNOSIS — R001 Bradycardia, unspecified: Secondary | ICD-10-CM

## 2014-06-05 DIAGNOSIS — E785 Hyperlipidemia, unspecified: Secondary | ICD-10-CM

## 2014-06-05 DIAGNOSIS — I25810 Atherosclerosis of coronary artery bypass graft(s) without angina pectoris: Secondary | ICD-10-CM

## 2014-06-05 DIAGNOSIS — Z951 Presence of aortocoronary bypass graft: Secondary | ICD-10-CM

## 2014-06-05 DIAGNOSIS — I10 Essential (primary) hypertension: Secondary | ICD-10-CM

## 2014-06-05 DIAGNOSIS — I4892 Unspecified atrial flutter: Secondary | ICD-10-CM

## 2014-06-05 DIAGNOSIS — I2510 Atherosclerotic heart disease of native coronary artery without angina pectoris: Secondary | ICD-10-CM

## 2014-06-05 DIAGNOSIS — Z950 Presence of cardiac pacemaker: Secondary | ICD-10-CM

## 2014-06-05 MED ORDER — ATORVASTATIN CALCIUM 80 MG PO TABS: 40.0000 mg | ORAL_TABLET | Freq: Every day | ORAL | Status: DC

## 2014-06-05 NOTE — Progress Notes (Signed)
Remote pacemaker transmission.   

## 2014-06-05 NOTE — Patient Instructions (Signed)
Your physician wants you to follow-up in 1 year with Dr. Ellyn Hack. You will receive a reminder letter in the mail 2 months in advance. If you do not receive a letter, please call our office to schedule the follow-up appointment.  A current/updated prescription has been sent to your pharmacy.

## 2014-06-06 ENCOUNTER — Encounter: Payer: Self-pay | Admitting: Cardiology

## 2014-06-06 NOTE — Assessment & Plan Note (Signed)
It would appear that the OM1 was probably a less prominent vessels at L1-2 and 3. The ischemia seen on stress test doesn't associate correlate to OM1 territory. Believed to be False Positive Stress Test.  Patent LIMA-LAD & seqSVG-OM2&3 in 2007.   Would only pursue evaluation if his symptoms recur.

## 2014-06-06 NOTE — Assessment & Plan Note (Signed)
Additionally, he was seen in February of this year with a relatively normal pacemaker followup. One month later and he was shown to be ERI. Status post generator change in April.  He is due for a telephonic remote download today.

## 2014-06-06 NOTE — Assessment & Plan Note (Signed)
Not documented on pacemaker interrogation. Dr. Recardo Evangelist stopped digoxin.

## 2014-06-06 NOTE — Assessment & Plan Note (Signed)
Well-controlled on diltiazem. Not on beta blocker due to history of fatigue.  The use of calcium channel blockers in the setting of normal EF are a reasonable substitute for beta blockers. His blood pressure is slightly on the high side today. If he continues to be elevated within a diabetic, would recommend ARB./ACE inhibitor.

## 2014-06-06 NOTE — Assessment & Plan Note (Signed)
No active symptoms of angina. On this diet and an aspirin. On diltiazem for rate control of A. Fib if it reccurs. In the past he did not do well on beta blockers. Per discussions with Dr. Rex Kras in the past, we will not pursue stress testing unless he has a recurrence of concern symptoms of angina.

## 2014-06-06 NOTE — Assessment & Plan Note (Signed)
On statin. Due for recent followup. Unfortunately, I don't have recent labs.

## 2014-06-06 NOTE — Progress Notes (Signed)
PCP: [name], MD  Clinic Note: Chief Complaint  Patient presents with  . Annual Exam    H/o CAD-CABG & PPM placement; No cardiac complaints   HPI: Erik Lane is a 63 y.o. male with a PMH below who presents today for one-year followup of CAD status post CABG as well as history of symptomatic bradycardia status post pacemaker. I last saw on October 2014, since then he has had Hydrologist for his pacemaker in April of 2015 -- was evaluated for fatigue & DOE --> pacer interrogation showed ERI. He is a former patient of Dr. Aldona Bar, myself the first time in November 2013.  His cardiac history dates back to 1996 when he underwent a Medtronic pacemaker placement for symptomatic sinus bradycardia. Hhe subsequently was found to have multivessel coronary artery disease in 2005, and was referred to CABG. He has had one cardiac catheterization done since and was found to have an occluded vein graft to the OM1. 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  As result, he has not had any further stress tests.  Past Medical History  Diagnosis Date  . Symptomatic sinus bradycardia 1996    status post pacemaker placement, exchanged in 2003 (Medtronic Sigma, in DDDR)  . CAD in native artery 2005    multivessel CAD, ostial 50-60% LAD, mid LAD 75-80% after D1; OM 2 with 50-60% followed by 60% after OM1. RCA small vessel. --> CABG referral  . S/P CABG x 2 2005    LIMA-LAD, SVG-OM1, seq SVG-OM 2-OM-3  . 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 CAD (coronary artery disease), autologous vein bypass graft February 2007    Occluded SVG-OM1 with patent LIMA-LAD & seq SVG-OM2-3  . Status post cardiac pacemaker procedure 1996, 2003;  10-2013    Medtronic Adapta - replaced 10/2013  . Paroxysmal atrial fibrillation     no recent documentation, although frequent mode switches noted but less than 2 minutes interval.  . Essential hypertension   . Dyslipidemia, goal LDL below 70     on statin  . Mild mitral regurgitation by prior echocardiogram 04/2013    Echo: EF 55-60%, G1 DD, mild MR  . Hernia of abdominal cavity     Prior Cardiac Evaluation and Past Surgical History: Reviewed in EPIC; pertinent data in PMH  Interval History:  He presents today with no major complaints. Since his pacemaker placement, he has been doing for a well. He denies any resting or exertional chest tightness/pressure or dyspnea. No PND, orthopnea or edema. No palpitations, lightheadedness, dizziness, weakness or syncope/near syncope. He is quite active and works long hours. He does get tired at the end of the long day, but denies any significant fatigue or dyspnea that he had at the time of his generator change.  No signs of heart failure or recurrent angina.  ROS: A comprehensive was performed. Review of Systems  Constitutional: Negative for fever, chills, weight loss and malaise/fatigue.  HENT: Negative for nosebleeds.   Eyes: Negative for blurred vision and double vision.  Respiratory: Negative for cough, shortness of breath and wheezing.   Cardiovascular: Negative for palpitations and claudication.       No Sx to suggest recurrence of Afib  Gastrointestinal: Negative for blood  in stool and melena.  Genitourinary: Negative for hematuria.  Musculoskeletal: Negative for myalgias and falls.  Neurological: Negative for dizziness, speech change, focal weakness, seizures and loss of consciousness.  Psychiatric/Behavioral: Negative for depression and memory loss. The patient is not nervous/anxious and does not have insomnia.   All other systems reviewed and are negative.   Current Outpatient Prescriptions on File Prior to Visit  Medication Sig  Dispense Refill  . aspirin 325 MG EC tablet Take 325 mg by mouth daily.      Marland Kitchen diltiazem (CARDIZEM CD) 180 MG 24 hr capsule Take 180 mg by mouth daily.    . metFORMIN (GLUCOPHAGE-XR) 500 MG 24 hr tablet Take 1 tablet (500 mg total) by mouth 2 (two) times daily.    Marland Kitchen NITROSTAT 0.4 MG SL tablet Place 1 tablet under the tongue every 15 (fifteen) minutes as needed.     No current facility-administered medications on file prior to visit.    ALLERGIES REVIEWED IN EPIC -- No change SOCIAL AND FAMILY HISTORY REVIEWED IN EPIC -- No change  Wt Readings from Last 3 Encounters:  06/05/14 202 lb 4.8 oz (91.763 kg)  11/25/13 200 lb 8 oz (90.946 kg)  10/26/13 203 lb (92.08 kg)   PHYSICAL EXAM BP 139/85 mmHg  Pulse 64  Ht 6\' 1"  (1.854 m)  Wt 202 lb 4.8 oz (91.763 kg)  BMI 26.70 kg/m2 General appearance: A&O x 3, cooperative, appears stated age, no distress and well-nourished, well-groomed HEENT: /AT, EOMI, MMM, anicteric sclera Neck: no adenopathy, no carotid bruit and no JVD Lungs: CTAB, normal percussion bilaterally and non-labored Heart: RRR, S1, split S2 normal, no murmur, click, rub or gallop; non-displaced PMI Abdomen: soft, non-tender; bowel sounds normal; no masses, no organomegaly;  Extremities: extremities normal, atraumatic, no cyanosis, and trivial edema  Pulses: 2+ and symmetric;  Neurologic: Mental status: Alert, oriented, thought content appropriate Cranial nerves: normal (II-XII grossly intact)   Adult ECG Report  Rate: 64 ;  Rhythm: Atrial-Paced rhythm and RBBB; normal axis, intervals, durations & volage.  Narrative Interpretation: Stable EKG  Recent Labs:  None available.    ASSESSMENT / PLAN: CAD in native artery No active symptoms of angina. On this diet and an aspirin. On diltiazem for rate control of A. Fib if it reccurs. In the past he did not do well on beta blockers. Per discussions with Dr. Rex Kras in the past, we will not pursue stress testing unless he has  a recurrence of concern symptoms of angina.  CAD (coronary artery disease), autologous vein bypass graft It would appear that the OM1 was probably a less prominent vessels at L1-2 and 3. The ischemia seen on stress test doesn't associate correlate to OM1 territory. Believed to be False Positive Stress Test.  Patent LIMA-LAD & seqSVG-OM2&3 in 2007.   Would only pursue evaluation if his symptoms recur.  Paroxysmal atrial flutter Not documented on pacemaker interrogation. Dr. Recardo Evangelist stopped digoxin.  Essential hypertension Well-controlled on diltiazem. Not on beta blocker due to history of fatigue.  The use of calcium channel blockers in the setting of normal EF are a reasonable substitute for beta blockers. His blood pressure is slightly on the high side today. If he continues to be elevated within a diabetic, would recommend ARB./ACE inhibitor.  Dyslipidemia, goal LDL below 70 On statin. Due for recent followup. Unfortunately, I don't have recent labs.  symptomatic bradycardia -- status post pacemaker Additionally, he was seen in February of this year with a relatively normal  pacemaker followup. One month later and he was shown to be ERI. Status post generator change in April.  He is due for a telephonic remote download today.    Orders Placed This Encounter  Procedures  . EKG 12-Lead   Meds ordered this encounter  Medications  . atorvastatin (LIPITOR) 80 MG tablet    Sig: Take 0.5 tablets (40 mg total) by mouth daily.    Dispense:  30 tablet    Refill:  11    Followup: one year   Teren Zurcher W, M.D., M.S. Interventional Cardiologist   Pager # [phone]

## 2014-06-07 LAB — MDC_IDC_ENUM_SESS_TYPE_REMOTE
Battery Remaining Longevity: 165 mo
Brady Statistic AP VP Percent: 1 %
Brady Statistic AP VS Percent: 90 %
Brady Statistic AS VP Percent: 0 %
Date Time Interrogation Session: 20151123160007
Lead Channel Impedance Value: 547 Ohm
Lead Channel Impedance Value: 677 Ohm
Lead Channel Pacing Threshold Amplitude: 0.5 V
Lead Channel Pacing Threshold Amplitude: 0.625 V
Lead Channel Sensing Intrinsic Amplitude: 5.6 mV
Lead Channel Setting Pacing Amplitude: 1.5 V
MDC IDC MSMT BATTERY IMPEDANCE: 100 Ohm
MDC IDC MSMT BATTERY VOLTAGE: 2.8 V
MDC IDC MSMT LEADCHNL RA PACING THRESHOLD PULSEWIDTH: 0.4 ms
MDC IDC MSMT LEADCHNL RV PACING THRESHOLD PULSEWIDTH: 0.4 ms
MDC IDC SET LEADCHNL RV PACING AMPLITUDE: 2 V
MDC IDC SET LEADCHNL RV PACING PULSEWIDTH: 0.4 ms
MDC IDC SET LEADCHNL RV SENSING SENSITIVITY: 2 mV
MDC IDC STAT BRADY AS VS PERCENT: 9 %

## 2014-06-22 ENCOUNTER — Encounter (HOSPITAL_COMMUNITY): Payer: Self-pay | Admitting: Cardiovascular Disease

## 2014-06-23 ENCOUNTER — Encounter: Payer: Self-pay | Admitting: Cardiovascular Disease

## 2014-08-09 ENCOUNTER — Telehealth: Payer: Self-pay | Admitting: Cardiology

## 2014-08-09 NOTE — Telephone Encounter (Signed)
I will need to review the [place] (need old paper chart) & cath films to compare.    It would help if he has a copy of what Dr. Rex Kras wrote in the past, so I know what to say & not be contradictory.  Leonie Man, MD

## 2014-08-09 NOTE — Telephone Encounter (Signed)
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

## 2014-08-09 NOTE — Telephone Encounter (Signed)
Returned call to patient. 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 He states Dr. Rex Kras had written him a letter similar to this in the past.   He would like a call with the information. He would like the letter mailed to him.   Will defer to Dr. Ellyn Hack for advice on letter

## 2014-08-11 NOTE — Telephone Encounter (Signed)
Located chart - placed on Dr. Allison Quarry cart.  Could not locate "letter" in chart.

## 2014-08-12 NOTE — Telephone Encounter (Signed)
I will look into it - but will not be in office until 2/5

## 2014-08-21 NOTE — Telephone Encounter (Signed)
Not yet.  [place]

## 2014-08-24 ENCOUNTER — Telehealth: Payer: Self-pay | Admitting: Cardiology

## 2014-08-24 NOTE — Telephone Encounter (Signed)
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

## 2014-08-24 NOTE — Telephone Encounter (Signed)
Spoke with pt, a copy of the office note from 06/05/2014 placed at checkout for patient to pick up in the morning.

## 2014-09-04 ENCOUNTER — Telehealth: Payer: Self-pay | Admitting: Cardiology

## 2014-09-04 DIAGNOSIS — R001 Bradycardia, unspecified: Secondary | ICD-10-CM

## 2014-09-04 NOTE — Telephone Encounter (Signed)
Pt reports past 2 weeks he has been having vertigo symptoms - head spinning when he lies down to sleep.  He gets dizzy upon standing in the morning, this resolves w/in ~4-5 minutes, and he experiences no symptoms throughout the day.  BP's reported are WNL. Denies palpitations or other concerns.  Advised I would route to Dr. Loletha Grayer for cardiac concerns given pt's history.

## 2014-09-04 NOTE — Telephone Encounter (Signed)
Erik Lane is calling because he has been waking up dizzy and then he stumbles around for a few then its gets better but keeps a light headache all day . Please call   Thanks

## 2014-09-04 NOTE — Telephone Encounter (Signed)
Please ask him to do another [facility] download to make sure PM is OK, but sounds more like orthostatic hypotension. Drink an extra bottle of water daily to see if better

## 2014-09-04 NOTE — Telephone Encounter (Signed)
Dr. Lurline Del recommendations given to patient. He voiced understanding. He will do download of PM tonight & try addtl fluid, report updates in a few days.

## 2014-09-05 ENCOUNTER — Telehealth: Payer: Self-pay | Admitting: *Deleted

## 2014-09-05 LAB — MDC_IDC_ENUM_SESS_TYPE_REMOTE
Battery Impedance: 100 Ohm
Battery Remaining Longevity: 165 mo
Battery Voltage: 2.8 V
Brady Statistic AP VP Percent: 1 %
Brady Statistic AP VS Percent: 90 %
Brady Statistic AS VS Percent: 9 %
Lead Channel Impedance Value: 539 Ohm
Lead Channel Pacing Threshold Amplitude: 0.5 V
Lead Channel Pacing Threshold Amplitude: 0.625 V
Lead Channel Pacing Threshold Pulse Width: 0.4 ms
Lead Channel Pacing Threshold Pulse Width: 0.4 ms
Lead Channel Setting Pacing Amplitude: 1.5 V
Lead Channel Setting Pacing Pulse Width: 0.4 ms
MDC IDC MSMT LEADCHNL RV IMPEDANCE VALUE: 660 Ohm
MDC IDC MSMT LEADCHNL RV SENSING INTR AMPL: 5.6 mV
MDC IDC SESS DTM: 20160223012328
MDC IDC SET LEADCHNL RV PACING AMPLITUDE: 2 V
MDC IDC SET LEADCHNL RV SENSING SENSITIVITY: 2 mV
MDC IDC STAT BRADY AS VP PERCENT: 0 %

## 2014-09-05 NOTE — Telephone Encounter (Signed)
Informed patient that remote was normal. Normal device function. No episodes recorded. Patient states that someone from the ofc mentioned to him that he should try to take Meclizine. I explained to him that Dr.Croitoru's recommendation was for him to drink extra water bc his symptoms sounded very similar to orthostatic hypotension.  I told him that if he didn't notice any relief to notify his PCP.  Patient voiced understanding.

## 2014-09-06 ENCOUNTER — Ambulatory Visit (INDEPENDENT_AMBULATORY_CARE_PROVIDER_SITE_OTHER): Payer: No Typology Code available for payment source | Admitting: *Deleted

## 2014-09-06 DIAGNOSIS — R001 Bradycardia, unspecified: Secondary | ICD-10-CM

## 2014-09-06 NOTE — Progress Notes (Signed)
Remote pacemaker transmission.   

## 2014-09-14 NOTE — Telephone Encounter (Signed)
Call Documentation      Fredia Beets, RN at 08/24/2014 1:00 PM     Status: Signed       Expand All Collapse All   Spoke with pt, a copy of the office note from 06/05/2014 placed at checkout for patient to pick up in the morning.            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

## 2014-09-15 ENCOUNTER — Encounter: Payer: Self-pay | Admitting: Cardiology

## 2014-09-20 ENCOUNTER — Encounter: Payer: Self-pay | Admitting: Cardiology

## 2014-09-20 ENCOUNTER — Encounter: Payer: Self-pay | Admitting: Cardiovascular Disease

## 2014-09-20 ENCOUNTER — Ambulatory Visit (INDEPENDENT_AMBULATORY_CARE_PROVIDER_SITE_OTHER): Payer: No Typology Code available for payment source | Admitting: Cardiology

## 2014-09-20 VITALS — BP 132/62 | HR 61 | Ht 73.0 in | Wt 202.7 lb

## 2014-09-20 DIAGNOSIS — Z950 Presence of cardiac pacemaker: Secondary | ICD-10-CM

## 2014-09-20 DIAGNOSIS — Z951 Presence of aortocoronary bypass graft: Secondary | ICD-10-CM

## 2014-09-20 DIAGNOSIS — I10 Essential (primary) hypertension: Secondary | ICD-10-CM

## 2014-09-20 DIAGNOSIS — I4892 Unspecified atrial flutter: Secondary | ICD-10-CM

## 2014-09-20 DIAGNOSIS — Z136 Encounter for screening for cardiovascular disorders: Secondary | ICD-10-CM

## 2014-09-20 DIAGNOSIS — I25810 Atherosclerosis of coronary artery bypass graft(s) without angina pectoris: Secondary | ICD-10-CM

## 2014-09-20 DIAGNOSIS — E785 Hyperlipidemia, unspecified: Secondary | ICD-10-CM

## 2014-09-20 NOTE — Patient Instructions (Addendum)
PLEASE ASK DR Wilson Singer ABOUT INCREASE WITH DILTIAZEM 180 TO 240 MG.  Dr Ellyn Hack has ordered the following test(s) to be done: 1. Lexiscan Myoview - For further information please visit [email or web address]. Please follow instruction sheet, as given.  Please schedule an appointment in MAY 2016 with Dr Sallyanne Kuster for a device check. Dr Ellyn Hack wants you to follow-up in 1 year. You will receive a reminder letter in the mail one months in advance. If you don't receive a letter, please call our office to schedule the follow-up appointment.
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
## 2014-09-21 ENCOUNTER — Encounter: Payer: Self-pay | Admitting: Cardiology

## 2014-09-21 ENCOUNTER — Telehealth (HOSPITAL_COMMUNITY): Payer: Self-pay

## 2014-09-21 DIAGNOSIS — Z021 Encounter for pre-employment examination: Secondary | ICD-10-CM | POA: Insufficient documentation

## 2014-09-21 NOTE — Assessment & Plan Note (Signed)
He is on statin.he reports that his PCP has been following his lipids. Unfortunately I have not seen any lab results reported to our office.

## 2014-09-21 NOTE — Assessment & Plan Note (Signed)
Pacemaker seems to be working well. Followed by Dr. Sallyanne Kuster.

## 2014-09-21 NOTE — Assessment & Plan Note (Signed)
Well-controlled with moderate dose diltiazem. With his history, if blood pressure continues to be elevated and further adjustments are recommended, I would probably consider using an ACE inhibitor or ARB.

## 2014-09-21 NOTE — Assessment & Plan Note (Signed)
He does need his DOT physical renewed. I think that being 11 years post CABG I would've a hard time being certain that he does not have any existing Graft Disease beyond What Was Ordered None. Plan: Treadmill Myoview Stress Test

## 2014-09-21 NOTE — Assessment & Plan Note (Signed)
He is now 11 years status post CABG and has a known occluded vein graft. His stress test was really only mildly abnormal, and I think it may very well have correlated with the OM1 being occluded. I think could be 11 years post CABG and he is in enough risk that the other graft could potentially also be problematic. My examination would be the issue should undergo another stress test in order to evaluate graft patency. We had a long talk about my thought process behind this, especially with the DOT physical concerns. I would not act on a single low risk study, however if there is a new finding of a new area of ischemia or larger inferolateral area of ischemia read as intermediate or high risk, I would then consider relook catheterization. Otherwise her a low risk study, I would not pursue invasive evaluation as he is totally asymptomatic.

## 2014-09-21 NOTE — Telephone Encounter (Signed)
Encounter complete. 

## 2014-09-21 NOTE — Assessment & Plan Note (Signed)
As far as I can tell, no recurrent episodes. Continue diltiazem for rate control if needed. Not on warfarin as I have not seen any recurrent episodes.

## 2014-09-21 NOTE — Assessment & Plan Note (Signed)
Would only pursue a stress test result was read as either intermediate or high risk, as he is not having any symptoms. He is on aspirin statin and diltiazem. This is acceptable for an antianginal agent in the setting of normal EF.

## 2014-09-26 ENCOUNTER — Telehealth: Payer: Self-pay | Admitting: *Deleted

## 2014-09-26 ENCOUNTER — Ambulatory Visit (HOSPITAL_COMMUNITY)
Admission: RE | Admit: 2014-09-26 | Discharge: 2014-09-26 | Disposition: A | Discharge status: Home / Self Care | Payer: No Typology Code available for payment source | Source: Ambulatory Visit | Attending: Cardiology | Admitting: Cardiology

## 2014-09-26 DIAGNOSIS — Z951 Presence of aortocoronary bypass graft: Secondary | ICD-10-CM | POA: Insufficient documentation

## 2014-09-26 DIAGNOSIS — E119 Type 2 diabetes mellitus without complications: Secondary | ICD-10-CM | POA: Diagnosis not present

## 2014-09-26 DIAGNOSIS — I25810 Atherosclerosis of coronary artery bypass graft(s) without angina pectoris: Secondary | ICD-10-CM | POA: Diagnosis present

## 2014-09-26 DIAGNOSIS — Z8249 Family history of ischemic heart disease and other diseases of the circulatory system: Secondary | ICD-10-CM | POA: Diagnosis not present

## 2014-09-26 DIAGNOSIS — I10 Essential (primary) hypertension: Secondary | ICD-10-CM | POA: Diagnosis not present

## 2014-09-26 MED ORDER — REGADENOSON 0.4 MG/5ML IV SOLN
0.4000 mg | Freq: Once | INTRAVENOUS | Status: AC
  Administered 2014-09-26: 0.4 mg via INTRAVENOUS

## 2014-09-26 MED ORDER — TECHNETIUM TC 99M SESTAMIBI GENERIC - CARDIOLITE
30.1000 | Freq: Once | INTRAVENOUS | Status: AC | PRN
  Administered 2014-09-26: 30.1 via INTRAVENOUS

## 2014-09-26 MED ORDER — TECHNETIUM TC 99M SESTAMIBI GENERIC - CARDIOLITE
10.5000 | Freq: Once | INTRAVENOUS | Status: AC | PRN
  Administered 2014-09-26: 11 via INTRAVENOUS

## 2014-09-26 NOTE — Telephone Encounter (Signed)
-----   Message from Leonie Man, MD sent at 09/26/2014  1:35 PM EDT ----- Stable to Improved Nuclear Stress Test -  There is still evidence of known prior heart attack in the anterolateral wall, but less prominent ischemia.   With known occlusion of the SVG-OM - this would be consistent with known disease & confirms prior Stress Test results.  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  HARDING, DAVID Viona Gilmore, MD  Pls forward to PCP

## 2014-09-26 NOTE — Procedures (Addendum)
[facility] [address] [place] [address] [phone]  Cardiology Nuclear Med Study  Erik Lane is a 64 y.o. male     MRN : [id number]     DOB: 25-Jul-1950  Procedure Date: 09/26/2014  Nuclear Med Background Indication for Stress Test:  Follow up CAD;DOT physical History:  CAD;CABG X2-2005;PTVP;Last NUC MPI on 20/21/2007-ischemia;EF=62%;AFIB Cardiac Risk Factors: Family History - CAD, Hypertension, Lipids, NIDDM and Overweight  Symptoms:  Fatigue   Nuclear Pre-Procedure Caffeine/Decaff Intake:  7:00pm NPO After: 3:00am   IV Site: R Forearm  IV 0.9% NS with Angio Cath:  22g  Chest Size (in):  44" IV Started by: Rolene Course, RN  Height: 6\' 1"  (1.854 m)  Cup Size: n/a  BMI:  Body mass index is 26.66 kg/(m^2). Weight:  202 lb (91.627 kg)   Tech Comments:  N/A    Nuclear Med Study 1 or 2 day study: 1 day  Stress Test Type:  [place] Provider:  Glenetta Hew, MD   Resting Radionuclide: Technetium 66m Sestamibi  Resting Radionuclide Dose: 10.5 mCi   Stress Radionuclide:  Technetium 76m Sestamibi  Stress Radionuclide Dose: 30.1 mCi           Stress Protocol Rest HR: 67 Stress HR: 65  Rest BP: 137/90 Stress BP: 151/82  Exercise Time (min): n/a METS: n/a          Dose of Adenosine (mg):  n/a Dose of Lexiscan: 0.4 mg  Dose of Atropine (mg): n/a Dose of Dobutamine: n/a mcg/kg/min (at max HR)  Stress Test Technologist: Leane Para, CCT Nuclear Technologist: Anthony Sar   Rest Procedure:  Myocardial perfusion imaging was performed at rest 45 minutes following the intravenous administration of Technetium 46m Sestamibi. Stress Procedure:  The patient received IV Lexiscan 0.4 mg over 15-seconds.  Technetium 4m Sestamibi injected IV at 30-seconds.  There were no significant changes with Lexiscan.  Quantitative spect images were obtained after a 45 minute delay.  Transient Ischemic  Dilatation (Normal <1.22):  1.30  QGS EDV:  121 ml QGS ESV:  60 ml LV Ejection Fraction: 50%    Rest ECG: NSR, PACs, RBBB.  Stress ECG: No significant ST segment change suggestive of ischemia.  QPS Raw Data Images:  Acquisition technically good; LVE. Stress Images:  There is decreased uptake in the anterolateral wall. Rest Images:  There is decreased uptake in the anterolateral wall. Subtraction (SDS):  These findings are consistent with prior infarct and mild peri-infarct ischemia.  Impression Exercise Capacity:  Lexiscan with no exercise. BP Response:  Normal blood pressure response. Clinical Symptoms:  There is dyspnea. ECG Impression:  No significant ST segment change suggestive of ischemia. Comparison with Prior Nuclear Study: Compared to 09/03/05, ischemia is less severe.  Overall Impression:  Low risk stress nuclear study with a medium size, moderate intensity, partially reversible anterolateral defect consistent with prior infarct and mild peri-infarct ischemia.  LV Wall Motion:  NL LV Function; NL Wall Motion   Kirk Ruths, MD  09/26/2014 1:13 PM

## 2014-09-26 NOTE — Telephone Encounter (Signed)
Left message to call back  

## 2014-09-26 NOTE — Telephone Encounter (Signed)
Spoke to patient. Result given . Verbalized understanding\ PATIENT STATES HE STILL NEED THE LETTER FOR THE D.O.T PHYSICAL

## 2014-09-28 NOTE — Progress Notes (Signed)
ADDENDUM TO CLINIC NOTE FOR 09/20/2014  Mr. Yim has completed his Myoview Nuclear Stress Test.  The results of his test show improved overall perfusion as compared to his last test in 2007 that was followed up with a Cardiac Cath demonstrating an occluded SVG-OM1, but other grafts patent.  Summary of Findings:   Low risk stress nuclear study with a medium size, moderate intensity, partially reversible anterolateral defect consistent with prior infarct and mild peri-infarct ischemia.  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   I do not think that a Treadmill Stress Test would be helpful for future evaluation, as the likelihood of false positivity is high & would generate unnecessary testing.    From a cardiology perspective, I would not recommend rechecking another Nuclear Stress Test for 4 more years.   I do not think that in the absence of ongoing active angina or CHF symptoms, that a screening stress test annually will be useful.    Thank you.  Leonie Man, M.D., M.S. Interventional Cardiologist   Pager # [phone]

## 2014-09-29 ENCOUNTER — Telehealth: Payer: Self-pay | Admitting: *Deleted

## 2014-09-29 NOTE — Telephone Encounter (Signed)
informed patient letter has been mailed for D.O.T. VERBALIZED UNDERSTANDING.

## 2014-12-12 ENCOUNTER — Encounter: Payer: Self-pay | Admitting: Cardiovascular Disease

## 2014-12-12 ENCOUNTER — Ambulatory Visit (INDEPENDENT_AMBULATORY_CARE_PROVIDER_SITE_OTHER): Payer: No Typology Code available for payment source | Admitting: Cardiovascular Disease

## 2014-12-12 VITALS — BP 125/73 | HR 65 | Ht 73.0 in | Wt 208.8 lb

## 2014-12-12 DIAGNOSIS — I4892 Unspecified atrial flutter: Secondary | ICD-10-CM | POA: Diagnosis not present

## 2014-12-12 DIAGNOSIS — Z950 Presence of cardiac pacemaker: Secondary | ICD-10-CM

## 2014-12-12 DIAGNOSIS — I10 Essential (primary) hypertension: Secondary | ICD-10-CM | POA: Diagnosis not present

## 2014-12-12 DIAGNOSIS — I25810 Atherosclerosis of coronary artery bypass graft(s) without angina pectoris: Secondary | ICD-10-CM | POA: Diagnosis not present

## 2014-12-12 DIAGNOSIS — R001 Bradycardia, unspecified: Secondary | ICD-10-CM | POA: Diagnosis not present

## 2014-12-12 DIAGNOSIS — Z951 Presence of aortocoronary bypass graft: Secondary | ICD-10-CM

## 2014-12-12 LAB — CUP PACEART INCLINIC DEVICE CHECK
Battery Remaining Longevity: 13.5
Battery Voltage: 2.8 V
Brady Statistic AP VS Percent: 90.5 %
Brady Statistic AS VP Percent: 0.1 % — CL
Date Time Interrogation Session: 20160531131653
Lead Channel Impedance Value: 539 Ohm
Lead Channel Impedance Value: 660 Ohm
Lead Channel Pacing Threshold Pulse Width: 0.4 ms
Lead Channel Sensing Intrinsic Amplitude: 5.6 mV
Lead Channel Setting Pacing Amplitude: 2 V
MDC IDC MSMT BATTERY IMPEDANCE: 100 Ohm
MDC IDC MSMT LEADCHNL RA PACING THRESHOLD AMPLITUDE: 0.5 V
MDC IDC MSMT LEADCHNL RA PACING THRESHOLD PULSEWIDTH: 0.4 ms
MDC IDC MSMT LEADCHNL RV PACING THRESHOLD AMPLITUDE: 0.625 V
MDC IDC SET LEADCHNL RA PACING AMPLITUDE: 1.5 V
MDC IDC SET LEADCHNL RV PACING PULSEWIDTH: 0.4 ms
MDC IDC SET LEADCHNL RV SENSING SENSITIVITY: 2 mV
MDC IDC STAT BRADY AP VP PERCENT: 0.7 %
MDC IDC STAT BRADY AS VS PERCENT: 8.7 %

## 2014-12-12 NOTE — Progress Notes (Signed)
Patient ID: Erik Lane, male   DOB: 1950/12/28, 64 y.o.   MRN: [id number]     Cardiology Office Note   Date:  12/12/2014   ID:  Erik Lane, DOB 1950-09-14, MRN [id number]  PCP:  [name], MD  Cardiologist: Glenetta Hew M.D.  Sanda Klein, MD   Chief Complaint  Patient presents with  . Annual Exam    left heal pain no other concerns      History of Present Illness: Erik Lane is a 64 y.o. male who presents for pacemaker follow-up. He sees Dr. Ellyn Hack for coronary artery disease with previous bypass surgery. He continues to work 13 hour shifts, 5 days a week and feels great.  Interrogation of his dual-chamber permanent pacemaker shows normal function. His Medtronic Adapta device was implanted in April 2015 (generator change out, initial device 2003) and has more than 13 years of estimated generator longevity. There is 91% atrial pacing and less than 1% ventricular pacing. Heart rate histogram is favorable without evidence of chronotropic incompetence. There have been no episodes of atrial tachycardia or atrial fibrillation recorded since his last device check.    Past Medical History  Diagnosis Date  . Symptomatic sinus bradycardia 1996    status post pacemaker placement, exchanged in 2003 (Medtronic Sigma, in DDDR)  . CAD in native artery 2005    multivessel CAD, ostial 50-60% LAD, mid LAD 75-80% after D1; OM 2 with 50-60% followed by 60% after OM1. RCA small vessel. --> CABG referral  . S/P CABG x 2 2005    LIMA-LAD, SVG-OM1, seq SVG-OM 2-OM-3  . 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 CAD (coronary artery disease), autologous vein bypass graft February 2007    Occluded SVG-OM1 with patent LIMA-LAD & seq SVG-OM2-3  . Status post cardiac pacemaker procedure 1996, 2003; 10-2013    Medtronic Adapta - replaced 10/2013  .  Paroxysmal atrial fibrillation     no recent documentation, although frequent mode switches noted but less than 2 minutes interval.  . Essential hypertension   . Dyslipidemia, goal LDL below 70     on statin  . Mild mitral regurgitation by prior echocardiogram 04/2013    Echo: EF 55-60%, G1 DD, mild MR  . Hernia of abdominal cavity     Past Surgical History  Procedure Laterality Date  . Coronary artery bypass graft  01/24/2004    LIMA-LAD, SVG-OM1, SVG-OM2/OM3 - Dr. Prescott Gum  . Pacemaker insertion  1996 ; 2003; 10-14-13    symptomatic bradycardia. generator change 2003 (Medtronic Sigma) DDDR; left sided device explanted 10-14-13 with new pacing system implatned on right side - MDT ADDRL1 by Dr Sallyanne Kuster  . Hernia repair    . Appendectomy    . Cardiac catheterization  01/17/2004    multivessel disease with osital 50-60% LAD, mid 70-80% after first diagonal, Cfx and OM2 had 50-60% lesion, 60% lesion after OM1 (Dr. Rockne Menghini)  . Cardiac catheterization  09/16/2005    SVG to OM1 occluded, other grafts patent, patent RCA, normal LV systolic function (Dr. Loni Muse. Little)  . Transthoracic echocardiogram  1996; 04/2013    normal LV function, mild LA dilatation;; b) 10/'14 - EF 55-60%, Gr 1 DD, mild LA dilation, Mild MR  . Nm myocar perf wall motion  08/2005    mod inferolateral ischemia seen at apex and mid ventricular level, EF 62%  .  Generator removal  10/14/2013    Procedure: GENERATOR REMOVAL;  Surgeon: Sanda Klein, MD;  Location: [place] [facility];  Service: Cardiovascular;;     Current Outpatient Prescriptions  Medication Sig Dispense Refill  . aspirin 325 MG EC tablet Take 325 mg by mouth daily.      Marland Kitchen atorvastatin (LIPITOR) 80 MG tablet Take 0.5 tablets (40 mg total) by mouth daily. 30 tablet 11  . diltiazem (CARDIZEM CD) 180 MG 24 hr capsule Take 180 mg by mouth daily.    . metFORMIN (GLUCOPHAGE-XR) 500 MG 24 hr tablet Take 1 tablet (500 mg total) by mouth 2 (two) times daily.    Marland Kitchen NITROSTAT 0.4 MG  SL tablet Place 1 tablet under the tongue every 15 (fifteen) minutes as needed.     No current facility-administered medications for this visit.    Allergies:   Review of patient's allergies indicates no known allergies.    Social History:  The patient  reports that he has never smoked. He has never used smokeless tobacco. He reports that he does not drink alcohol or use illicit drugs.   Family History:  The patient's family history includes Heart attack in his brother and father; Heart disease in his father; Heart failure in his father.    ROS:  Please see the history of present illness.    Otherwise, review of systems positive for none.   All other systems are reviewed and negative.    PHYSICAL EXAM: VS:  BP 125/73 mmHg  Pulse 65  Ht 6\' 1"  (1.854 m)  Wt 94.711 kg (208 lb 12.8 oz)  BMI 27.55 kg/m2 , BMI Body mass index is 27.55 kg/(m^2).  General: Alert, oriented x3, no distress Head: no evidence of trauma, PERRL, EOMI, no exophtalmos or lid lag, no myxedema, no xanthelasma; normal ears, nose and oropharynx Neck: normal jugular venous pulsations and no hepatojugular reflux; brisk carotid pulses without delay and no carotid bruits Chest: clear to auscultation, no signs of consolidation by percussion or palpation, normal fremitus, symmetrical and full respiratory excursions, healthy left subclavian pacemaker site and healed sternotomy scar Cardiovascular: normal position and quality of the apical impulse, regular rhythm, normal first and second heart sounds, no murmurs, rubs or gallops Abdomen: no tenderness or distention, no masses by palpation, no abnormal pulsatility or arterial bruits, normal bowel sounds, no hepatosplenomegaly Extremities: no clubbing, cyanosis or edema; 2+ radial, ulnar and brachial pulses bilaterally; 2+ right femoral, posterior tibial and dorsalis pedis pulses; 2+ left femoral, posterior tibial and dorsalis pedis pulses; no subclavian or femoral  bruits Neurological: grossly nonfocal Psych: euthymic mood, full affect   EKG:  EKG is not ordered today.    Wt Readings from Last 3 Encounters:  12/12/14 94.711 kg (208 lb 12.8 oz)  09/20/14 91.944 kg (202 lb 11.2 oz)  06/05/14 91.763 kg (202 lb 4.8 oz)      ASSESSMENT AND PLAN:  Significant sinus bradycardia with normally functioning dual-chamber permanent pacemaker and no recent recordings of previously described atrial flutter. Continue with [facility] remote monitoring every 3 months and an office visit on a yearly basis. Labs performed by Dr. Wilson Singer.   Current medicines are reviewed at length with the patient today.  The patient does not have concerns regarding medicines.  The following changes have been made:  no change  Labs/ tests ordered today include:  Orders Placed This Encounter  Procedures  . Implantable device check  . EKG 12-Lead    Patient Instructions  Remote monitoring is  used to monitor your pacemaker from home. This monitoring reduces the number of office visits required to check your device to one time per year. It allows [place] to keep an eye on the functioning of your device to ensure it is working properly. You are scheduled for a device check from home on 03/13/2015. You may send your transmission at any time that day. If you have a wireless device, the transmission will be sent automatically. After your physician reviews your transmission, you will receive a postcard with your next transmission date.  Dr Sallyanne Kuster recommends that you schedule a follow-up appointment in 12 months with pacer check. You will receive a reminder letter in the mail two months in advance. If you don't receive a letter, please call our office to schedule the follow-up appointment.    Mikael Spray, MD  12/12/2014 5:03 PM    Sanda Klein, MD, [facility] [phone] office [phone] pager

## 2014-12-12 NOTE — Patient Instructions (Addendum)
Remote monitoring is used to monitor your pacemaker from home. This monitoring reduces the number of office visits required to check your device to one time per year. It allows [place] to keep an eye on the functioning of your device to ensure it is working properly. You are scheduled for a device check from home on 03/13/2015. You may send your transmission at any time that day. If you have a wireless device, the transmission will be sent automatically. After your physician reviews your transmission, you will receive a postcard with your next transmission date.  Dr Sallyanne Kuster recommends that you schedule a follow-up appointment in 12 months with pacer check. You will receive a reminder letter in the mail two months in advance. If you don't receive a letter, please call our office to schedule the follow-up appointment.

## 2014-12-19 ENCOUNTER — Encounter: Payer: Self-pay | Admitting: Cardiovascular Disease

## 2014-12-26 ENCOUNTER — Encounter: Payer: Self-pay | Admitting: Cardiovascular Disease

## 2015-01-05 ENCOUNTER — Encounter (HOSPITAL_COMMUNITY): Payer: Self-pay

## 2015-01-05 ENCOUNTER — Emergency Department (HOSPITAL_COMMUNITY)
Admission: EM | Admit: 2015-01-05 | Discharge: 2015-01-05 | Disposition: A | Discharge status: Home / Self Care | Payer: No Typology Code available for payment source | Attending: Emergency Medicine | Admitting: Emergency Medicine

## 2015-01-05 ENCOUNTER — Encounter (HOSPITAL_COMMUNITY): Payer: Self-pay | Admitting: *Deleted

## 2015-01-05 ENCOUNTER — Other Ambulatory Visit (HOSPITAL_COMMUNITY): Payer: Self-pay | Admitting: Physician Assistant

## 2015-01-05 ENCOUNTER — Ambulatory Visit (HOSPITAL_COMMUNITY)
Admission: RE | Admit: 2015-01-05 | Discharge: 2015-01-05 | Disposition: A | Discharge status: Home / Self Care | Payer: No Typology Code available for payment source | Source: Ambulatory Visit | Attending: Emergency Medicine | Admitting: Emergency Medicine

## 2015-01-05 ENCOUNTER — Other Ambulatory Visit (HOSPITAL_COMMUNITY): Payer: Self-pay | Admitting: Emergency Medicine

## 2015-01-05 DIAGNOSIS — R6883 Chills (without fever): Secondary | ICD-10-CM | POA: Insufficient documentation

## 2015-01-05 DIAGNOSIS — Z951 Presence of aortocoronary bypass graft: Secondary | ICD-10-CM | POA: Insufficient documentation

## 2015-01-05 DIAGNOSIS — N132 Hydronephrosis with renal and ureteral calculous obstruction: Secondary | ICD-10-CM | POA: Diagnosis not present

## 2015-01-05 DIAGNOSIS — I700 Atherosclerosis of aorta: Secondary | ICD-10-CM | POA: Diagnosis not present

## 2015-01-05 DIAGNOSIS — I480 Paroxysmal atrial fibrillation: Secondary | ICD-10-CM | POA: Diagnosis not present

## 2015-01-05 DIAGNOSIS — Z950 Presence of cardiac pacemaker: Secondary | ICD-10-CM | POA: Diagnosis not present

## 2015-01-05 DIAGNOSIS — I2510 Atherosclerotic heart disease of native coronary artery without angina pectoris: Secondary | ICD-10-CM | POA: Diagnosis not present

## 2015-01-05 DIAGNOSIS — N200 Calculus of kidney: Secondary | ICD-10-CM | POA: Insufficient documentation

## 2015-01-05 DIAGNOSIS — R102 Pelvic and perineal pain: Secondary | ICD-10-CM

## 2015-01-05 DIAGNOSIS — Z7982 Long term (current) use of aspirin: Secondary | ICD-10-CM | POA: Insufficient documentation

## 2015-01-05 DIAGNOSIS — R109 Unspecified abdominal pain: Secondary | ICD-10-CM | POA: Diagnosis present

## 2015-01-05 DIAGNOSIS — E785 Hyperlipidemia, unspecified: Secondary | ICD-10-CM | POA: Insufficient documentation

## 2015-01-05 DIAGNOSIS — Z79899 Other long term (current) drug therapy: Secondary | ICD-10-CM | POA: Insufficient documentation

## 2015-01-05 DIAGNOSIS — I10 Essential (primary) hypertension: Secondary | ICD-10-CM | POA: Diagnosis not present

## 2015-01-05 LAB — URINE MICROSCOPIC-ADD ON

## 2015-01-05 LAB — CBC WITH DIFFERENTIAL/PLATELET
BASOS PCT: 0 % (ref 0–1)
Basophils Absolute: 0 10*3/uL (ref 0.0–0.1)
EOS PCT: 1 % (ref 0–5)
Eosinophils Absolute: 0.1 10*3/uL (ref 0.0–0.7)
HEMATOCRIT: 41.8 % (ref 39.0–52.0)
Hemoglobin: 14.1 g/dL (ref 13.0–17.0)
Lymphocytes Relative: 24 % (ref 12–46)
Lymphs Abs: 1.8 10*3/uL (ref 0.7–4.0)
MCH: 29.4 pg (ref 26.0–34.0)
MCHC: 33.7 g/dL (ref 30.0–36.0)
MCV: 87.1 fL (ref 78.0–100.0)
MONOS PCT: 10 % (ref 3–12)
Monocytes Absolute: 0.7 10*3/uL (ref 0.1–1.0)
NEUTROS ABS: 4.9 10*3/uL (ref 1.7–7.7)
Neutrophils Relative %: 65 % (ref 43–77)
Platelets: 155 10*3/uL (ref 150–400)
RBC: 4.8 MIL/uL (ref 4.22–5.81)
RDW: 13 % (ref 11.5–15.5)
WBC: 7.6 10*3/uL (ref 4.0–10.5)

## 2015-01-05 LAB — BASIC METABOLIC PANEL
Anion gap: 6 (ref 5–15)
BUN: 18 mg/dL (ref 6–20)
CO2: 27 mmol/L (ref 22–32)
CREATININE: 0.78 mg/dL (ref 0.61–1.24)
Calcium: 8.9 mg/dL (ref 8.9–10.3)
Chloride: 109 mmol/L (ref 101–111)
Glucose, Bld: 124 mg/dL — ABNORMAL HIGH (ref 65–99)
POTASSIUM: 4.1 mmol/L (ref 3.5–5.1)
Sodium: 142 mmol/L (ref 135–145)

## 2015-01-05 LAB — URINALYSIS, ROUTINE W REFLEX MICROSCOPIC
Bilirubin Urine: NEGATIVE
GLUCOSE, UA: NEGATIVE mg/dL
Ketones, ur: NEGATIVE mg/dL
Leukocytes, UA: NEGATIVE
Nitrite: NEGATIVE
Protein, ur: NEGATIVE mg/dL
SPECIFIC GRAVITY, URINE: 1.019 (ref 1.005–1.030)
UROBILINOGEN UA: 1 mg/dL (ref 0.0–1.0)
pH: 6 (ref 5.0–8.0)

## 2015-01-05 MED ORDER — ONDANSETRON HCL 4 MG PO TABS: 4.0000 mg | ORAL_TABLET | Freq: Four times a day (QID) | ORAL | Status: DC

## 2015-01-05 MED ORDER — TAMSULOSIN HCL 0.4 MG PO CAPS: 0.4000 mg | ORAL_CAPSULE | Freq: Every day | ORAL | Status: DC

## 2015-01-05 MED ORDER — OXYCODONE-ACETAMINOPHEN 5-325 MG PO TABS: 1.0000 | ORAL_TABLET | Freq: Four times a day (QID) | ORAL | Status: DC | PRN

## 2015-01-05 NOTE — ED Provider Notes (Signed)
CSN: [id number]     Arrival date & time 01/05/15  1757 History  This chart was scribed for Erik Bible, PA-C, working with Ernestina Patches, MD by Steva Colder, ED Scribe. The patient was seen in room TR05C/TR05C at 7:51 PM.    Chief Complaint  Patient presents with  . Nephrolithiasis     The history is provided by the patient. No language interpreter was used.    HPI Comments: Erik Lane is a 64 y.o. male who presents to the Emergency Department complaining of nephrolithiasis onset last night from midnight to 3 AM. Pt was informed by his PCP to come into the ED because he had a kidney stone. Pt was Rx pain medications by his PCP but he has not been able to pick the medication up yet. He states that he is having associated symptoms of chills. He states that he has not tried any medications for the relief of his symptoms. He denies hematuria, dysuria, fever, n/v, urinary retention, abdominal pain, penile pain, testicular pain, and any other symptoms. He notes that he has had intermittent hematuria earlier in the week but none today. Pt denies having kidney stones in the past.   Past Medical History  Diagnosis Date  . Symptomatic sinus bradycardia 1996    status post pacemaker placement, exchanged in 2003 (Medtronic Sigma, in DDDR)  . CAD in native artery 2005    multivessel CAD, ostial 50-60% LAD, mid LAD 75-80% after D1; OM 2 with 50-60% followed by 60% after OM1. RCA small vessel. --> CABG referral  . S/P CABG x 2 2005    LIMA-LAD, SVG-OM1, seq SVG-OM 2-OM-3  . 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 CAD (coronary artery disease), autologous vein bypass graft February 2007    Occluded SVG-OM1 with patent LIMA-LAD & seq SVG-OM2-3  . Status post cardiac pacemaker procedure 1996, 2003; 10-2013    Medtronic Adapta - replaced 10/2013  . Paroxysmal atrial  fibrillation     no recent documentation, although frequent mode switches noted but less than 2 minutes interval.  . Essential hypertension   . Dyslipidemia, goal LDL below 70     on statin  . Mild mitral regurgitation by prior echocardiogram 04/2013    Echo: EF 55-60%, G1 DD, mild MR  . Hernia of abdominal cavity    Past Surgical History  Procedure Laterality Date  . Coronary artery bypass graft  01/24/2004    LIMA-LAD, SVG-OM1, SVG-OM2/OM3 - Dr. Prescott Gum  . Pacemaker insertion  1996 ; 2003; 10-14-13    symptomatic bradycardia. generator change 2003 (Medtronic Sigma) DDDR; left sided device explanted 10-14-13 with new pacing system implatned on right side - MDT ADDRL1 by Dr Sallyanne Kuster  . Hernia repair    . Appendectomy    . Cardiac catheterization  01/17/2004    multivessel disease with osital 50-60% LAD, mid 70-80% after first diagonal, Cfx and OM2 had 50-60% lesion, 60% lesion after OM1 (Dr. Rockne Menghini)  . Cardiac catheterization  09/16/2005    SVG to OM1 occluded, other grafts patent, patent RCA, normal LV systolic function (Dr. Loni Muse. Little)  . Transthoracic echocardiogram  1996; 04/2013    normal LV function, mild LA dilatation;; b) 10/'14 - EF 55-60%, Gr 1 DD, mild LA dilation, Mild MR  . Nm myocar perf wall motion  08/2005    mod inferolateral ischemia seen at apex and  mid ventricular level, EF 62%  . Generator removal  10/14/2013    Procedure: GENERATOR REMOVAL;  Surgeon: Sanda Klein, MD;  Location: [place] [facility];  Service: Cardiovascular;;   Family History  Problem Relation Age of Onset  . Heart disease Father   . Heart attack Father   . Heart attack Brother   . Heart failure Father    History  Substance Use Topics  . Smoking status: Never Smoker   . Smokeless tobacco: Never Used  . Alcohol Use: No    Review of Systems  Constitutional: Positive for chills. Negative for fever.  Gastrointestinal: Negative for nausea, vomiting and abdominal pain.  Genitourinary: Negative for  dysuria, hematuria, difficulty urinating, penile pain and testicular pain.      Allergies  Review of patient's allergies indicates no known allergies.  Home Medications   Prior to Admission medications   Medication Sig Start Date End Date Taking? Authorizing Provider  aspirin 325 MG EC tablet Take 325 mg by mouth daily.     Yes Historical Provider, MD  atorvastatin (LIPITOR) 80 MG tablet Take 0.5 tablets (40 mg total) by mouth daily. 06/05/14  Yes Leonie Man, MD  diltiazem (CARDIZEM CD) 180 MG 24 hr capsule Take 180 mg by mouth daily. 04/11/13  Yes Historical Provider, MD  metFORMIN (GLUCOPHAGE-XR) 500 MG 24 hr tablet Take 1 tablet (500 mg total) by mouth 2 (two) times daily. 10/16/13  Yes Dayna N Dunn, PA-C  NITROSTAT 0.4 MG SL tablet Place 1 tablet under the tongue every 15 (fifteen) minutes as needed. 10/03/13  Yes Historical Provider, MD   BP 144/99 mmHg  Pulse 64  Temp(Src) 97.8 F (36.6 C) (Oral)  Resp 16  Ht 6\' 2"  (1.88 m)  Wt 208 lb (94.348 kg)  BMI 26.69 kg/m2  SpO2 99% Physical Exam  Constitutional: He is oriented to person, place, and time. He appears well-developed and well-nourished. No distress.  HENT:  Head: Normocephalic and atraumatic.  Eyes: EOM are normal.  Neck: Neck supple. No tracheal deviation present.  Cardiovascular: Normal rate, regular rhythm and normal heart sounds.  Exam reveals no gallop and no friction rub.   No murmur heard. Pulmonary/Chest: Effort normal and breath sounds normal. No respiratory distress. He has no wheezes. He has no rales.  Abdominal: Soft. Bowel sounds are normal. He exhibits no distension and no mass. There is no tenderness. There is no rebound, no guarding and no CVA tenderness.  Musculoskeletal: Normal range of motion.  Neurological: He is alert and oriented to person, place, and time.  Skin: Skin is warm and dry.  Psychiatric: He has a normal mood and affect. His behavior is normal.  Nursing note and vitals  reviewed.   ED Course  Procedures (including critical care time) DIAGNOSTIC STUDIES: Oxygen Saturation is 99% on RA, nl by my interpretation.    COORDINATION OF CARE: 7:55 PM-Discussed treatment plan which includes labs and UA with pt at bedside and pt agreed to plan.   Labs Review Labs Reviewed  BASIC METABOLIC PANEL - Abnormal; Notable for the following:    Glucose, Bld 124 (*)    All other components within normal limits  URINALYSIS, ROUTINE W REFLEX MICROSCOPIC (NOT AT [facility]) - Abnormal; Notable for the following:    Hgb urine dipstick LARGE (*)    All other components within normal limits  CBC WITH DIFFERENTIAL/PLATELET  URINE MICROSCOPIC-ADD ON    Imaging Review Ct Abdomen Pelvis Wo Contrast  01/05/2015   CLINICAL DATA:  Left-sided flank pain  EXAM: CT ABDOMEN AND PELVIS WITHOUT CONTRAST  TECHNIQUE: Multidetector CT imaging of the abdomen and pelvis was performed following the standard protocol without IV contrast.  COMPARISON:  06/13/2011  FINDINGS: Lung bases are free of acute infiltrate or sizable effusion. Scarring is noted in the left lung base stable from the previous exam. Heavy coronary calcifications are seen. A pacing device is again noted.  The liver, gallbladder, spleen, pancreas and adrenal glands are within normal limits. The kidneys are well visualized bilaterally. The right kidney shows no renal calculi or urinary tract obstructive changes. The left kidney shows perinephric stranding as well as mild hydronephrosis and hydroureter. This continues into the mid left ureter where a 5 mm partially obstructing stone is identified. The more distal left ureter is within normal limits. The bladder is decompressed.  The appendix is not visualized consistent with the given surgical history. No significant diverticulitis is seen. No pelvic mass lesion or sidewall abnormality is noted. Diffuse atherosclerotic calcifications of the aorta and iliac vessels are noted. Bilateral L5 pars  defects are noted with mild anterolisthesis of L5 on S1.  IMPRESSION: Left hydronephrosis and hydroureter secondary to a 5 mm mid ureteral stone as described. This likely represents enlargement and migration of a previously seen nonobstructing stone.   Electronically Signed   By: Inez Catalina M.D.   On: 01/05/2015 17:05     EKG Interpretation None      MDM   Final diagnoses:  None   Patient presents today due to a 5 mm ureteral stone that was seen on an outpatient CT ab/pelvis w/o contrast.  CT with mild hydronephrosis and hydroureter.  Patient denies any pain in the ED.  No vomiting.  Tolerating PO liquids.  Creatine WNL.  UA showing no evidence of infection.  Patient stable for discharge.  Patient given referral to urology.  Patient also discharged home with Percocet, Zofran, and Flomax.  Return precautions given.   Erik Bible, PA-C 01/05/15 2141  Ernestina Patches, MD 01/06/15 1124

## 2015-01-05 NOTE — ED Notes (Signed)
Pt states that he had a CT scan done that showed his left kidney was blocked. States that his MD told him to come to ED.

## 2015-01-05 NOTE — Progress Notes (Signed)
Report called to Solmon Ice PA 01/05/15 1745 by CCrouse with read back. PA spoke to patient by CT phone and instructed him to go to the emergency room. Patient was taken to the ED by CCrouse. Charge RN was notified.

## 2015-01-05 NOTE — Discharge Instructions (Signed)
Take pain medication as needed.  Do not drive or operate heavy machinery for 4-6 hours after taking medication.

## 2015-02-22 ENCOUNTER — Ambulatory Visit (INDEPENDENT_AMBULATORY_CARE_PROVIDER_SITE_OTHER): Payer: No Typology Code available for payment source

## 2015-02-22 ENCOUNTER — Ambulatory Visit (INDEPENDENT_AMBULATORY_CARE_PROVIDER_SITE_OTHER): Payer: No Typology Code available for payment source | Admitting: Podiatry

## 2015-02-22 ENCOUNTER — Encounter: Payer: Self-pay | Admitting: Podiatry

## 2015-02-22 VITALS — BP 116/71 | HR 67 | Resp 16 | Ht 73.0 in | Wt 208.0 lb

## 2015-02-22 DIAGNOSIS — M722 Plantar fascial fibromatosis: Secondary | ICD-10-CM

## 2015-02-22 DIAGNOSIS — Z0189 Encounter for other specified special examinations: Secondary | ICD-10-CM | POA: Diagnosis not present

## 2015-02-22 MED ORDER — TRIAMCINOLONE ACETONIDE 10 MG/ML IJ SUSP
10.0000 mg | Freq: Once | INTRAMUSCULAR | Status: AC
  Administered 2015-02-22: 10 mg

## 2015-02-22 NOTE — Patient Instructions (Signed)

## 2015-02-22 NOTE — Progress Notes (Signed)
   Subjective:    Patient ID: Erik Lane, male    DOB: 1950/08/08, 64 y.o.   MRN: [id number]  HPI Patient presents with foot pain on their Left foot, heel. This has been going on since Februrary 2016.  Pt stated, "foot hurts more in the morning". Pt has not done anything to help foot feel better.   Review of Systems  Constitutional: Positive for activity change and fatigue.  Neurological: Positive for light-headedness.  Hematological: Bruises/bleeds easily.  All other systems reviewed and are negative.      Objective:   Physical Exam        Assessment & Plan:

## 2015-02-23 ENCOUNTER — Ambulatory Visit: Payer: No Typology Code available for payment source | Admitting: Podiatry

## 2015-03-01 ENCOUNTER — Encounter: Payer: Self-pay | Admitting: Podiatry

## 2015-03-01 ENCOUNTER — Ambulatory Visit (INDEPENDENT_AMBULATORY_CARE_PROVIDER_SITE_OTHER): Payer: No Typology Code available for payment source | Admitting: Podiatry

## 2015-03-01 VITALS — BP 132/89 | HR 69 | Resp 12

## 2015-03-01 DIAGNOSIS — M722 Plantar fascial fibromatosis: Secondary | ICD-10-CM

## 2015-03-01 MED ORDER — TRIAMCINOLONE ACETONIDE 10 MG/ML IJ SUSP
10.0000 mg | Freq: Once | INTRAMUSCULAR | Status: AC
  Administered 2015-03-01: 10 mg

## 2015-03-04 NOTE — Progress Notes (Signed)
Subjective:     Patient ID: Erik Lane, male   DOB: 1951-04-24, 64 y.o.   MRN: [id number]  HPI patient states my left foot is doing better but still sore if I do a lot of walking and I know my arches are not helping   Review of Systems     Objective:   Physical Exam Neurovascular status intact muscle strength adequate range of motion within normal limits with patient noted to have significant diminishment of discomfort plantar aspect left heel at the insertional point tendon the calcaneus but moderate pain upon deep palpation and still noted to have depression of the arch upon weightbearing    Assessment:     Plantar fasciitis left acute nature with inflammation fluid buildup noted    Plan:     H&P x-ray reviewed and today I injected the left plantar fascia 3 mg Kenalog 5 mg Xylocaine and recommended orthotics for the long-term. Explained the utilization of orthotics and scanned for custom orthotic devices and patient will be seen back for [place] to recheck again when returned in 3 weeks or earlier if needed

## 2015-03-13 ENCOUNTER — Encounter: Payer: No Typology Code available for payment source | Admitting: *Deleted

## 2015-03-13 ENCOUNTER — Telehealth: Payer: Self-pay | Admitting: Cardiology

## 2015-03-13 NOTE — Telephone Encounter (Signed)
Attempted to confirm remote transmission with pt. No answer and was unable to leave a message.   

## 2015-03-14 ENCOUNTER — Encounter: Payer: Self-pay | Admitting: Cardiology

## 2015-03-23 ENCOUNTER — Ambulatory Visit: Payer: No Typology Code available for payment source | Admitting: *Deleted

## 2015-03-23 DIAGNOSIS — M722 Plantar fascial fibromatosis: Secondary | ICD-10-CM

## 2015-03-23 NOTE — Patient Instructions (Signed)

## 2015-03-23 NOTE — Progress Notes (Signed)
Patient ID: Erik Lane, male   DOB: 29-Oct-1950, 64 y.o.   MRN: [id number] Patient presents for orthotic pick up.  Verbal and written break in and wear instructions given.  Patient will follow up in 4 weeks if symptoms worsen or fail to improve.

## 2015-05-07 ENCOUNTER — Ambulatory Visit (INDEPENDENT_AMBULATORY_CARE_PROVIDER_SITE_OTHER): Payer: No Typology Code available for payment source | Admitting: *Deleted

## 2015-05-07 ENCOUNTER — Other Ambulatory Visit: Payer: Self-pay | Admitting: Cardiology

## 2015-05-07 DIAGNOSIS — R001 Bradycardia, unspecified: Secondary | ICD-10-CM

## 2015-05-08 LAB — CUP PACEART REMOTE DEVICE CHECK
Battery Impedance: 138 Ohm
Battery Voltage: 2.8 V
Brady Statistic AP VP Percent: 1 %
Brady Statistic AP VS Percent: 91 %
Brady Statistic AS VP Percent: 0 %
Implantable Lead Implant Date: 20150403
Implantable Lead Location: 753859
Implantable Lead Location: 753860
Implantable Lead Model: 5076
Implantable Lead Model: 5076
Lead Channel Impedance Value: 522 Ohm
Lead Channel Impedance Value: 658 Ohm
Lead Channel Setting Pacing Amplitude: 2 V
Lead Channel Setting Sensing Sensitivity: 2 mV
MDC IDC LEAD IMPLANT DT: 20150403
MDC IDC MSMT BATTERY REMAINING LONGEVITY: 151 mo
MDC IDC SESS DTM: 20161023161854
MDC IDC SET LEADCHNL RA PACING AMPLITUDE: 1.5 V
MDC IDC SET LEADCHNL RV PACING PULSEWIDTH: 0.4 ms
MDC IDC STAT BRADY AS VS PERCENT: 9 %

## 2015-05-08 NOTE — Progress Notes (Signed)
Remote pacemaker transmission.   

## 2015-05-09 ENCOUNTER — Encounter: Payer: Self-pay | Admitting: Cardiology

## 2015-06-09 ENCOUNTER — Other Ambulatory Visit: Payer: Self-pay | Admitting: Cardiology

## 2015-06-11 NOTE — Telephone Encounter (Signed)
Rx(s) sent to pharmacy electronically.  

## 2015-08-06 ENCOUNTER — Ambulatory Visit (INDEPENDENT_AMBULATORY_CARE_PROVIDER_SITE_OTHER): Payer: Self-pay | Admitting: *Deleted

## 2015-08-06 DIAGNOSIS — R001 Bradycardia, unspecified: Secondary | ICD-10-CM

## 2015-08-08 ENCOUNTER — Encounter: Payer: Self-pay | Admitting: Cardiology

## 2015-08-10 LAB — CUP PACEART REMOTE DEVICE CHECK
Battery Remaining Longevity: 148 mo
Battery Voltage: 2.79 V
Brady Statistic AP VP Percent: 0 %
Date Time Interrogation Session: 20170124183420
Implantable Lead Implant Date: 20150403
Implantable Lead Location: 753860
Lead Channel Impedance Value: 564 Ohm
Lead Channel Pacing Threshold Amplitude: 0.5 V
Lead Channel Pacing Threshold Amplitude: 0.625 V
Lead Channel Pacing Threshold Pulse Width: 0.4 ms
Lead Channel Pacing Threshold Pulse Width: 0.4 ms
Lead Channel Sensing Intrinsic Amplitude: 5.6 mV
Lead Channel Setting Pacing Amplitude: 2 V
Lead Channel Setting Pacing Pulse Width: 0.4 ms
Lead Channel Setting Sensing Sensitivity: 2 mV
MDC IDC LEAD IMPLANT DT: 20150403
MDC IDC LEAD LOCATION: 753859
MDC IDC MSMT BATTERY IMPEDANCE: 163 Ohm
MDC IDC MSMT LEADCHNL RV IMPEDANCE VALUE: 675 Ohm
MDC IDC SET LEADCHNL RA PACING AMPLITUDE: 1.5 V
MDC IDC STAT BRADY AP VS PERCENT: 92 %
MDC IDC STAT BRADY AS VP PERCENT: 0 %
MDC IDC STAT BRADY AS VS PERCENT: 8 %

## 2015-08-10 NOTE — Progress Notes (Signed)
Remote pacemaker transmission.   

## 2015-08-15 ENCOUNTER — Encounter: Payer: Self-pay | Admitting: Cardiology

## 2015-11-14 ENCOUNTER — Encounter: Payer: Self-pay | Admitting: Cardiovascular Disease

## 2015-12-21 ENCOUNTER — Encounter: Payer: Self-pay | Admitting: Cardiology

## 2015-12-21 ENCOUNTER — Ambulatory Visit (INDEPENDENT_AMBULATORY_CARE_PROVIDER_SITE_OTHER): Payer: Managed Care, Other (non HMO) | Admitting: Cardiology

## 2015-12-21 VITALS — BP 153/84 | HR 63 | Ht 73.0 in | Wt 204.6 lb

## 2015-12-21 DIAGNOSIS — I10 Essential (primary) hypertension: Secondary | ICD-10-CM

## 2015-12-21 DIAGNOSIS — I25810 Atherosclerosis of coronary artery bypass graft(s) without angina pectoris: Secondary | ICD-10-CM | POA: Diagnosis not present

## 2015-12-21 DIAGNOSIS — I2510 Atherosclerotic heart disease of native coronary artery without angina pectoris: Secondary | ICD-10-CM

## 2015-12-21 DIAGNOSIS — G4700 Insomnia, unspecified: Secondary | ICD-10-CM

## 2015-12-21 DIAGNOSIS — I4892 Unspecified atrial flutter: Secondary | ICD-10-CM

## 2015-12-21 DIAGNOSIS — E785 Hyperlipidemia, unspecified: Secondary | ICD-10-CM

## 2015-12-21 DIAGNOSIS — Z951 Presence of aortocoronary bypass graft: Secondary | ICD-10-CM

## 2015-12-21 MED ORDER — MELATONIN 5 MG PO CAPS: 5.0000 mg | ORAL_CAPSULE | Freq: Every evening | ORAL | Status: DC | PRN

## 2015-12-21 NOTE — Progress Notes (Signed)
PCP: [name], MD  Clinic Note: Chief Complaint  Patient presents with  . Follow-up    cramping in legs and arms  . Coronary Artery Disease    HPI: Erik Lane is a 65 y.o. male with a PMH below who presents today for Delayed annual follow-up for CAD.  He has a long-standing history of CAD and cardiac disease with bradycardia dating back to 1996 requiring placement of a pacemaker. The most recent update was in April 2015.  Coronary artery disease was documented first in 2005 with multivessel disease-->CABG.  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 However the catheterization showed an occluded SVG to OM1 a patent SVG OM 2-O. And 3 and patent LIMA. Ever since then he has not had any recurrent symptoms, but has also not had any followup stress test due to the "false positive test that was done in 2007. Dr. 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 He reportedly has PAF, however no recent episodes of been documented.  JOSHAUA YURKOVICH was last seen In May 2016 for bradycardia/pacemaker follow-up by Dr. Sallyanne Kuster. I last saw him in March 2016.  Recent Hospitalizations: None  Studies Reviewed:  March 2016 Myoview: Low risk stress nuclear study with a medium size, moderate intensity, partially reversible anterolateral defect consistent with prior infarct and mild peri-infarct ischemia. -- Similar to prior stress test.  Interval History: Perseus presents today actually a little bit out of sorts. He states that he just has not been feeling well lately. He states that he just is minimal bit more tired than usual. He did have some cramping in his arms and legs has been getting tired easier than usual. 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 He states this is quite tedious  and hard work changing from full and empty trailers getting in and out of trucks etc. He just feels exhausted by mid day, this is associate with a little bit of shortness of breath. When I ask him about his pre-CABG symptoms, it was basically fatigue with some exertional dyspnea which is similar 20s feeling now. He also tells me he is not getting a lot of sleep. Is he's having a hard time getting full night of sleep, but has been scared to try any sleep aids. He denies any resting or exertional chest tightness or pressure, but does have some exertional dyspnea. He denies any PND, orthopnea or edema.  Denies any rapid irregular heartbeats or palpitations.  No lightheadedness, dizziness, weakness or syncope/near syncope. No TIA/amaurosis fugax symptoms. No melena, hematochezia, hematuria, or epstaxis. No claudication.  ROS: A comprehensive was performed. Review of Systems  Constitutional: Positive for malaise/fatigue.  HENT: Negative for congestion.   Respiratory: Negative for cough, shortness of breath and wheezing.   Cardiovascular: Negative for claudication.  Gastrointestinal: Negative for blood in stool and melena.  Genitourinary: Negative for hematuria.  Musculoskeletal: Negative for myalgias, joint pain and falls.  Neurological: Negative for headaches.  Endo/Heme/Allergies: Does not bruise/bleed easily.  Psychiatric/Behavioral: Negative for depression and memory loss. The patient is not nervous/anxious and does not have insomnia.   All other systems reviewed and are negative.   Past Medical History  Diagnosis Date  . Symptomatic sinus bradycardia 1996    status post pacemaker placement, exchanged in  2003 (Medtronic Sigma, in DDDR)  . CAD in native artery 2005    multivessel CAD, ostial 50-60% LAD, mid LAD 75-80% after D1; OM 2 with 50-60% followed by 60% after OM1. RCA small vessel. --> CABG referral  . S/P CABG x 2 2005    LIMA-LAD, SVG-OM1, seq SVG-OM 2-OM-3  . 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 CAD (coronary artery disease), autologous vein bypass graft February 2007    Occluded SVG-OM1 with patent LIMA-LAD & seq SVG-OM2-3  . Status post cardiac pacemaker procedure 1996, 2003; 10-2013    Medtronic Adapta - replaced 10/2013  . Paroxysmal atrial fibrillation (HCC)     no recent documentation, although frequent mode switches noted but less than 2 minutes interval.  . Essential hypertension   . Dyslipidemia, goal LDL below 70     on statin  . Mild mitral regurgitation by prior echocardiogram 04/2013    Echo: EF 55-60%, G1 DD, mild MR  . Hernia of abdominal cavity     Past Surgical History  Procedure Laterality Date  . Coronary artery bypass graft  01/24/2004    LIMA-LAD, SVG-OM1, SVG-OM2/OM3 - Dr. Prescott Gum  . Pacemaker insertion  1996 ; 2003; 10-14-13    symptomatic bradycardia. generator change 2003 (Medtronic Sigma) DDDR; left sided device explanted 10-14-13 with new pacing system implatned on right side - MDT ADDRL1 by Dr Sallyanne Kuster  . Hernia repair    . Appendectomy    . Cardiac catheterization  01/17/2004    multivessel disease with osital 50-60% LAD, mid 70-80% after first diagonal, Cfx and OM2 had 50-60% lesion, 60% lesion after OM1 (Dr. Rockne Menghini)  . Cardiac catheterization  09/16/2005    SVG to OM1 occluded, other grafts patent, patent RCA, normal LV systolic function (Dr. Loni Muse. Little)  . Transthoracic echocardiogram  1996; 04/2013    normal LV function, mild LA dilatation;; b) 10/'14 - EF 55-60%, Gr 1 DD, mild LA dilation, Mild MR  . Nm myocar perf wall motion  08/2005    mod inferolateral ischemia seen at apex and mid ventricular level, EF 62%  . Generator removal  10/14/2013    Procedure: GENERATOR REMOVAL;  Surgeon: Sanda Klein, MD;  Location: [facility];  Service: Cardiovascular;;   Prior to Admission medications     Medication Sig Start Date End Date Taking? Authorizing Provider  aspirin 325 MG EC tablet Take 325 mg by mouth daily.     Yes Historical Provider, MD  atorvastatin (LIPITOR) 80 MG tablet TAKE 1/2 TABLET BY MOUTH EVERY DAY 06/11/15  Yes Leonie Man, MD  diltiazem (CARDIZEM CD) 180 MG 24 hr capsule Take 180 mg by mouth daily. 04/11/13  Yes Historical Provider, MD  metFORMIN (GLUCOPHAGE-XR) 500 MG 24 hr tablet Take 1 tablet (500 mg total) by mouth 2 (two) times daily. 10/16/13  Yes Dayna N Dunn, PA-C  NITROSTAT 0.4 MG SL tablet Place 1 tablet under the tongue every 15 (fifteen) minutes as needed. 10/03/13  Yes Historical Provider, MD  prednisoLONE acetate (PRED FORTE) 1 % ophthalmic suspension  07/18/15  Yes Historical Provider, MD   No Known Allergies   Social History   Social History  . Marital Status: Widowed    Spouse Name: N/A  . Number of Children: 2  . Years of Education: N/A   Occupational History  .     Social History  Main Topics  . Smoking status: Never Smoker   . Smokeless tobacco: Never Used  . Alcohol Use: No  . Drug Use: No  . Sexual Activity: Not Asked   Other Topics Concern  . None   Social History Narrative   He is a father of 2, grandfather 30. He does chew tobacco on daily basis but quit smoking 12 years ago. Never drinks a fall. 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   Family History  Problem Relation Age of Onset  . Heart disease Father   . Heart attack Father   . Heart attack Brother   . Heart failure Father      Wt Readings from Last 3 Encounters:  12/21/15 204 lb 9.6 oz (92.806 kg)  02/22/15 208 lb (94.348 kg)  01/05/15 208 lb (94.348 kg)    PHYSICAL EXAM BP 153/84 mmHg  Pulse 63  Ht 6\' 1"  (1.854 m)  Wt 204 lb 9.6 oz (92.806 kg)  BMI 27.00 kg/m2 General appearance: A&O x 3, cooperative, appears stated age, no distress and well-nourished, well-groomed HEENT: [place]/AT,  EOMI, MMM, anicteric sclera Neck: no adenopathy, no carotid bruit and no JVD Lungs: CTAB, normal percussion bilaterally and non-labored Heart: RRR, S1, split S2 normal, no murmur, click, rub or gallop; non-displaced PMI Abdomen: soft, non-tender; bowel sounds normal; no masses, no organomegaly;  Extremities: extremities normal, atraumatic, no cyanosis, and trivial edema  Pulses: 2+ and symmetric;  Neurologic: Mental status: Alert, oriented, thought content appropriate Cranial nerves: normal (II-XII grossly intact)   Adult ECG Report  Rate: 63 ;  Rhythm: Atrial paced rhythm, RBBB. Otherwise normal axis, intervals and durations.;   Narrative Interpretation: Stable EKG   Other studies Reviewed: Additional studies/ records that were reviewed today include:  Recent Labs: 07/10/2015  Na+ 140, K+ 4.7, Cl- 99, HCO3- 22 , BUN 13, Cr 0.95, Glu 101, Ca2+ 9.4; AST 21, ALT 24, AlkP 86, Alb 4.6, TP 7.0, T Bili 0.9  CBC: W 8.3, H/H 16.0/46.5, Plt 224  TC 145, TG 105, HDL 42, LDL 82   ASSESSMENT / PLAN: Problem List Items Addressed This Visit    S/P CABG x 4 : LIMA-LAD, SVG-OM1 (known to be occluded), seq SVG-OM 2-OM 3 - Primary (Chronic)   Relevant Orders   EKG 12-Lead   Paroxysmal atrial flutter (HCC) (Chronic)    Again I cannot tell if he is having any further episodes. He is on diltiazem for rate control but is also paced. As he is not had any recurrent episodes, he is not currently on anticoagulation.      Insomnia (Chronic)    I think this may be his main issue that he is not getting enough sleep. We will try melatonin for the next month or so to see if this improves. If not may need to consider Ambien or another agent.      Essential hypertension (Chronic)    Has always been well controlled. I think he may be a little bit fatigued and tired, will need to continue to monitor closely and potentially consider adding ARB/ACE inhibitor.      Relevant Orders   EKG 12-Lead    Dyslipidemia, goal LDL below 70 (Chronic)    Close to goal by last labs. Should be due soon. Continues to be on moderate-dose atorvastatin      CAD in native artery (Chronic)    He never really had much  the way of any significant active anginal symptoms in the past. He is stress test to be nonischemic in the past as well. Now he started have symptoms of fatigue and dyspnea that were similar to his pre-CABG symptoms. He can't really remember exactly the situation that led to him getting his initial heart catheterization, but he recalls these being similar symptoms.  For now I am not convinced that this is cardiac in nature, I think it may simply be these not getting enough sleep, and is having fatigue related to his older and working hard. We'll continue to monitor, but if he continues to have symptoms that are somewhat concerning for potential anginal symptoms, I would've low threshold for proceeding directly to cardiac catheterization given his history of false positives on stress tests.  He is on aspirin and diltiazem along with Lipitor.      CAD (coronary artery disease), autologous vein bypass graft (Chronic)   Relevant Orders   EKG 12-Lead      Current medicines are reviewed at length with the patient today. (+/- concerns) None The following changes have been made: None MAY TAKE MELATONIN 5 MG FOR SLEEP AS NEEDED   NO OTHER CHANGES   Your physician recommends that you schedule a follow-up appointment LATE IN July  2017 WITH DR. HARDING IF YOU STILL HAVE SYMPTOMS MAY SCHEDULE CARDIAC CATHETERIZATION   Studies Ordered:   Orders Placed This Encounter  Procedures  . EKG 12-Lead      Glenetta Hew, M.D., M.S. Interventional Cardiologist   Pager # [phone] Phone # [phone] [address] [place], [place] [address]

## 2015-12-21 NOTE — Patient Instructions (Addendum)
MAY TAKE MELATONIN 5 MG FOR SLEEP AS NEEDED   NO OTHER CHANGES   Your physician recommends that you schedule a follow-up appointment LATE IN July  2017 WITH DR. HARDING IF YOU STILL HAVE SYMPTOMS MAY SCHEDULE CARDIAC CATHETERIZATION   If you need a refill on your cardiac medications before your next appointment, please call your pharmacy.

## 2015-12-23 ENCOUNTER — Encounter: Payer: Self-pay | Admitting: Cardiology

## 2015-12-23 DIAGNOSIS — G4700 Insomnia, unspecified: Secondary | ICD-10-CM | POA: Insufficient documentation

## 2015-12-23 NOTE — Assessment & Plan Note (Signed)
Again I cannot tell if he is having any further episodes. He is on diltiazem for rate control but is also paced. As he is not had any recurrent episodes, he is not currently on anticoagulation.

## 2015-12-23 NOTE — Assessment & Plan Note (Signed)
He never really had much the way of any significant active anginal symptoms in the past. He is stress test to be nonischemic in the past as well. Now he started have symptoms of fatigue and dyspnea that were similar to his pre-CABG symptoms. He can't really remember exactly the situation that led to him getting his initial heart catheterization, but he recalls these being similar symptoms.  For now I am not convinced that this is cardiac in nature, I think it may simply be these not getting enough sleep, and is having fatigue related to his older and working hard. We'll continue to monitor, but if he continues to have symptoms that are somewhat concerning for potential anginal symptoms, I would've low threshold for proceeding directly to cardiac catheterization given his history of false positives on stress tests.  He is on aspirin and diltiazem along with Lipitor.

## 2015-12-23 NOTE — Assessment & Plan Note (Signed)
I think this may be his main issue that he is not getting enough sleep. We will try melatonin for the next month or so to see if this improves. If not may need to consider Ambien or another agent.

## 2015-12-23 NOTE — Assessment & Plan Note (Signed)
Close to goal by last labs. Should be due soon. Continues to be on moderate-dose atorvastatin

## 2015-12-23 NOTE — Assessment & Plan Note (Signed)
Has always been well controlled. I think he may be a little bit fatigued and tired, will need to continue to monitor closely and potentially consider adding ARB/ACE inhibitor.

## 2016-01-10 IMAGING — CT CT ABD-PELV W/O CM
2 of 4 series · 16 of 46 positions shown, 18 images · non-contrast
Comparison: 06/13/2011

CLINICAL DATA: Left-sided flank pain

EXAM:
CT ABDOMEN AND PELVIS WITHOUT CONTRAST
TECHNIQUE: Multidetector CT imaging of the abdomen and pelvis was performed
following the standard protocol without IV contrast.

[Series 2: stone study 5.0 i30f 1 · axial · 0.75mm/px · z∈[+976,+1466]mm · 13 of 108 slices shown, 15 images]
[im 5/108  soft-tissue]
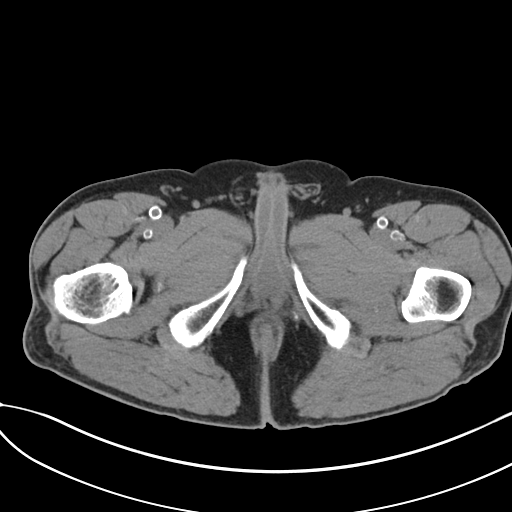
[im 5/108  bone]
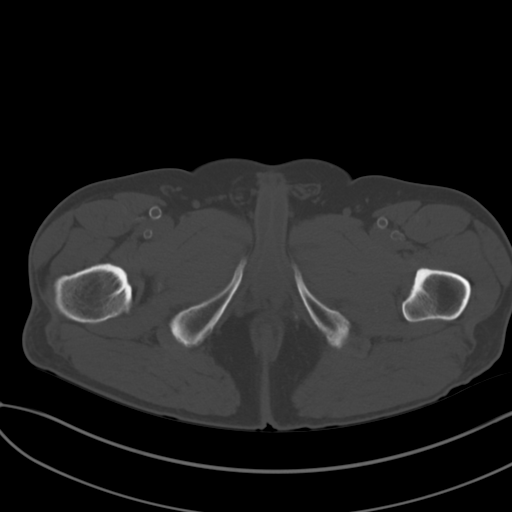
[im 14/108  soft-tissue]
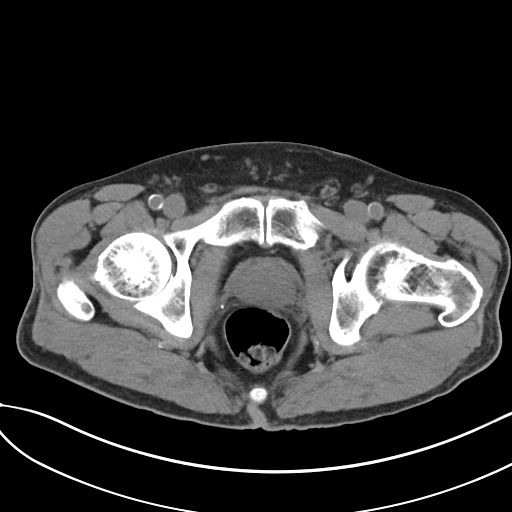
[im 23/108  soft-tissue]
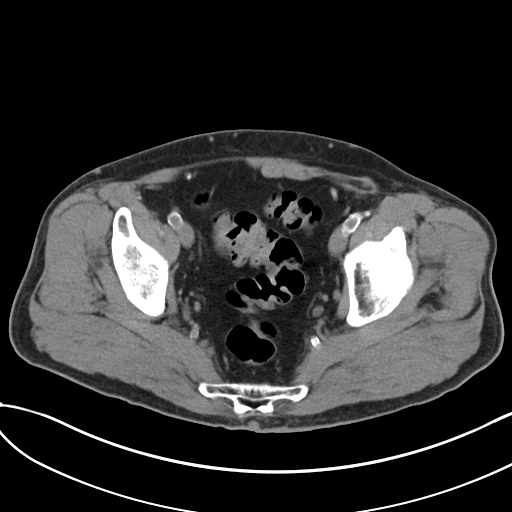
[im 32/108  soft-tissue]
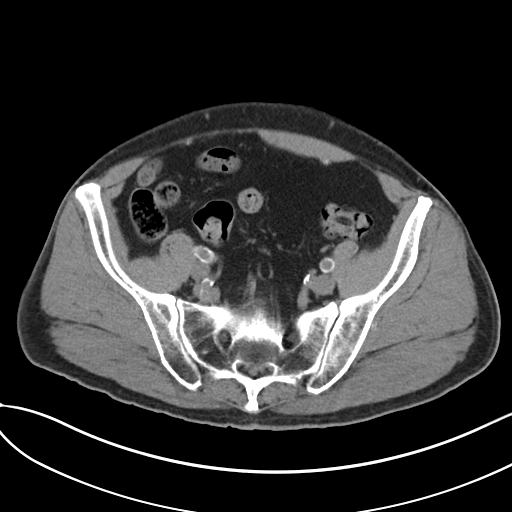
[im 36/108  soft-tissue]
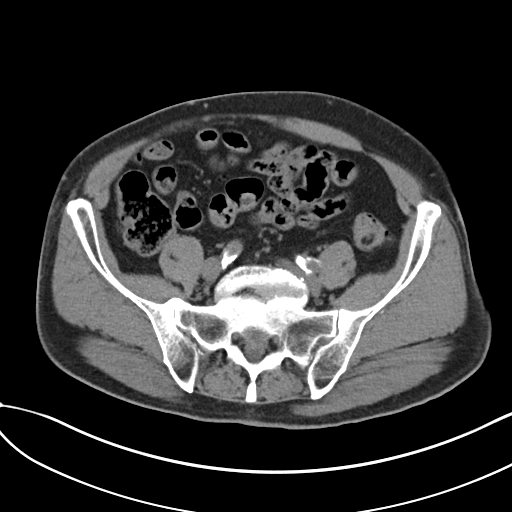
[im 45/108  soft-tissue]
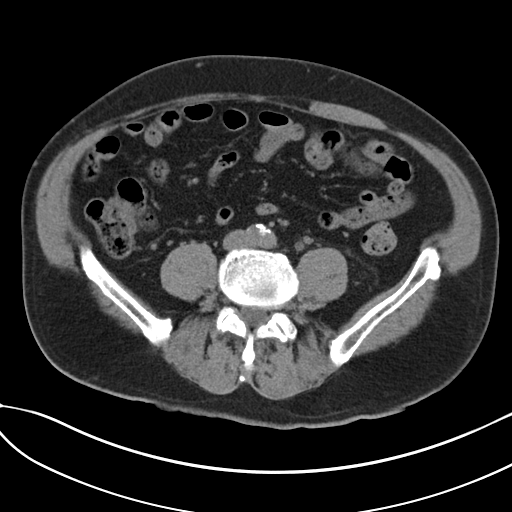
[im 54/108  soft-tissue]
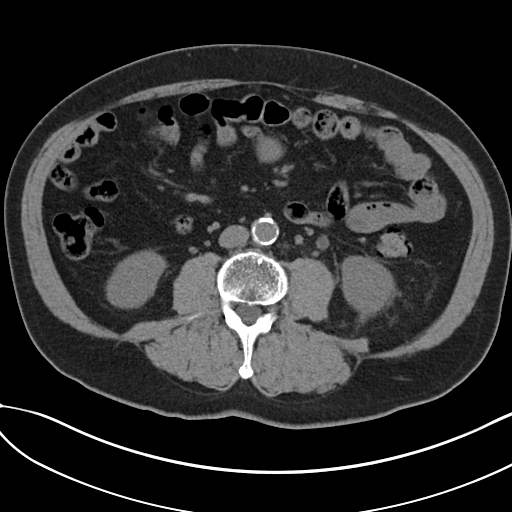
[im 63/108  soft-tissue]
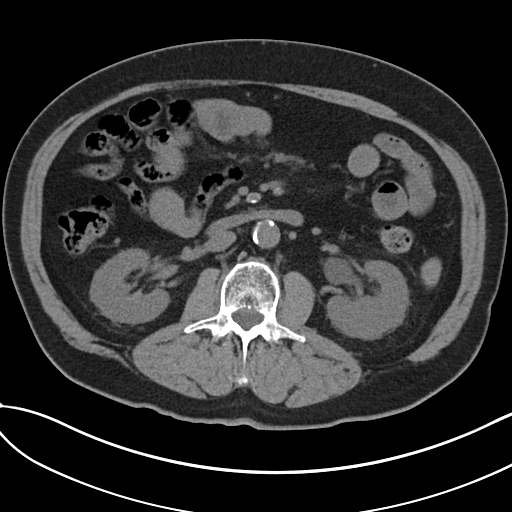
[im 72/108  soft-tissue]
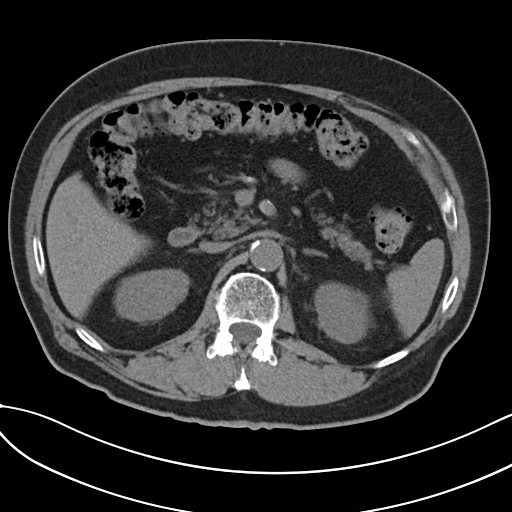
[im 72/108  bone]
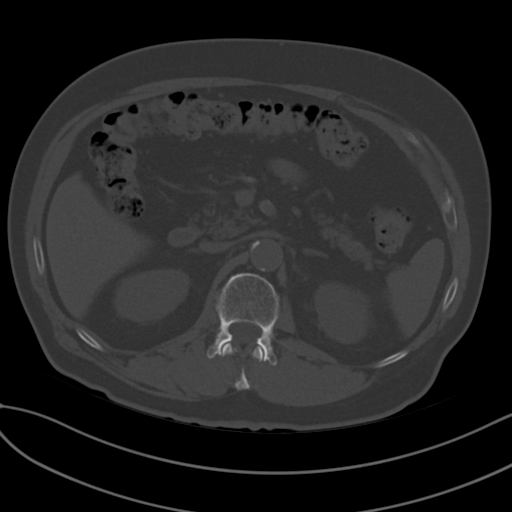
[im 76/108  soft-tissue]
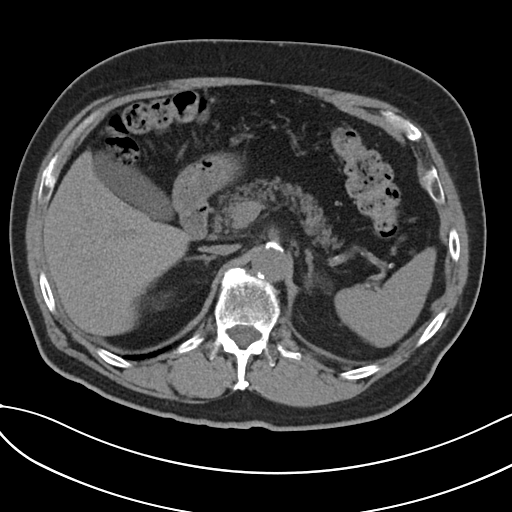
[im 85/108  soft-tissue]
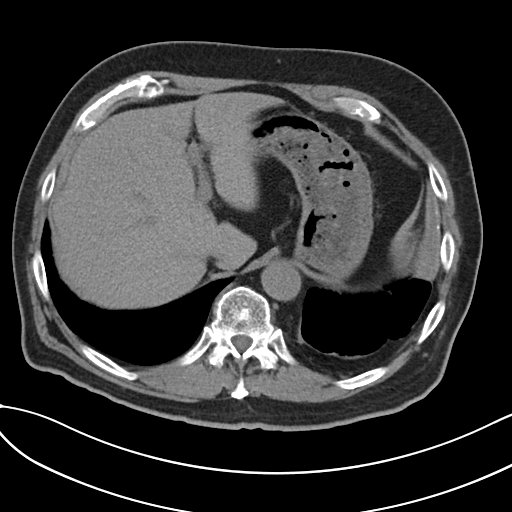
[im 94/108  soft-tissue]
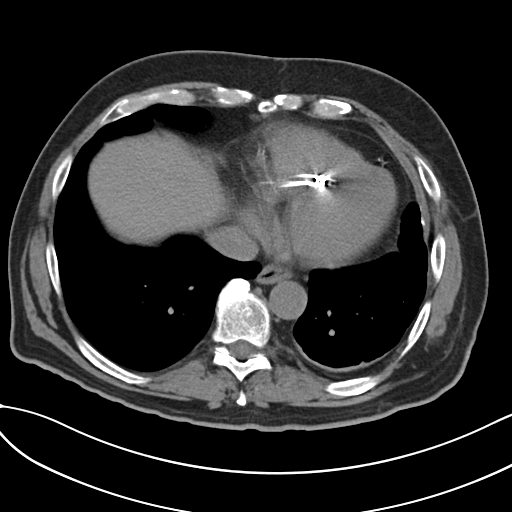
[im 103/108  soft-tissue]
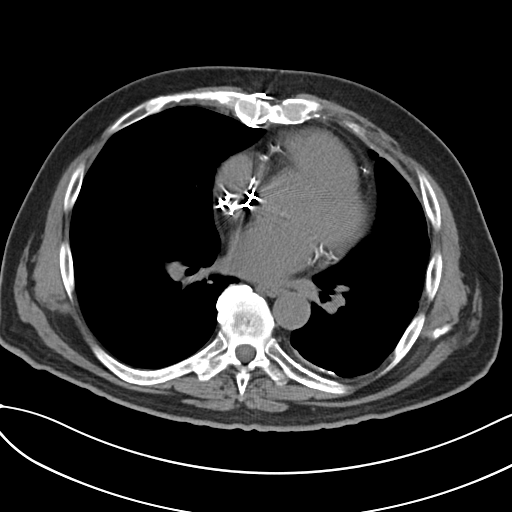

[Series 5: coronal soft tissue · coronal · 0.74mm/px · 3 of 89 slices shown]
[im 30/89  soft-tissue]
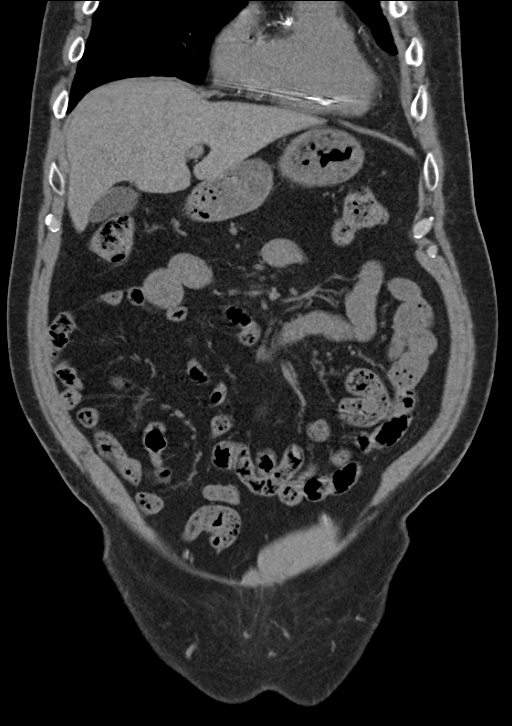
[im 40/89  soft-tissue]
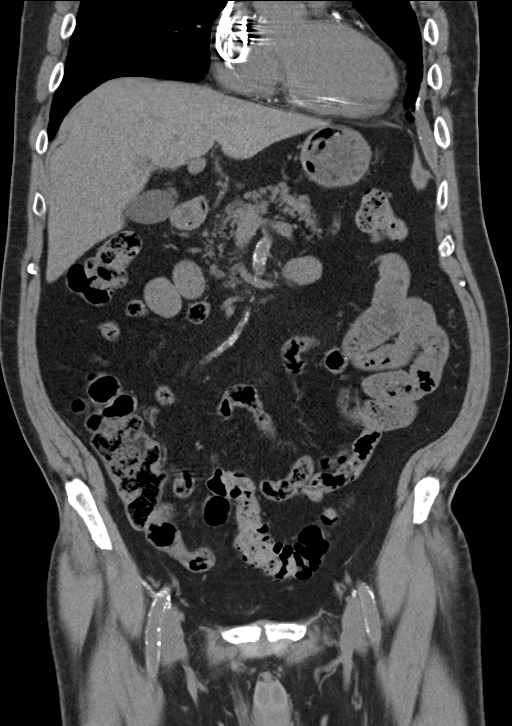
[im 49/89  soft-tissue]
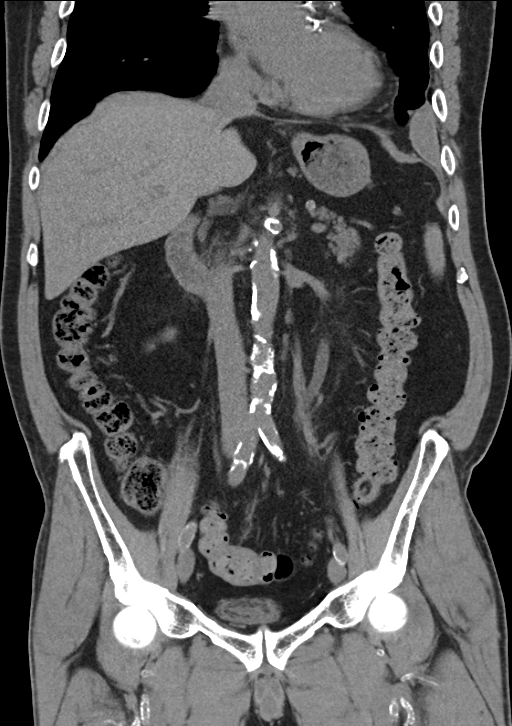

[16 of 46 positions shown; findings below may reference images not displayed]

FINDINGS: Lung bases are free of acute infiltrate or sizable effusion.
Scarring is noted in the left lung base stable from the previous
exam. Heavy coronary calcifications are seen. A pacing device is
again noted.

The liver, gallbladder, spleen, pancreas and adrenal glands are
within normal limits. The kidneys are well visualized bilaterally.
The right kidney shows no renal calculi or urinary tract obstructive
changes. The left kidney shows perinephric stranding as well as mild
hydronephrosis and hydroureter. This continues into the mid left
ureter where a 5 mm partially obstructing stone is identified. The
more distal left ureter is within normal limits. The bladder is
decompressed.

The appendix is not visualized consistent with the given surgical
history. No significant diverticulitis is seen. No pelvic mass
lesion or sidewall abnormality is noted. Diffuse atherosclerotic
calcifications of the aorta and iliac vessels are noted. Bilateral
L5 pars defects are noted with mild anterolisthesis of L5 on S1.
IMPRESSION: Left hydronephrosis and hydroureter secondary to a 5 mm mid ureteral
stone as described. This likely represents enlargement and migration
of a previously seen nonobstructing stone.

## 2016-01-29 ENCOUNTER — Encounter: Payer: Self-pay | Admitting: Cardiovascular Disease

## 2016-01-29 ENCOUNTER — Ambulatory Visit (INDEPENDENT_AMBULATORY_CARE_PROVIDER_SITE_OTHER): Payer: Managed Care, Other (non HMO) | Admitting: Cardiovascular Disease

## 2016-01-29 VITALS — BP 147/84 | HR 62 | Ht 73.0 in | Wt 209.0 lb

## 2016-01-29 DIAGNOSIS — I25810 Atherosclerosis of coronary artery bypass graft(s) without angina pectoris: Secondary | ICD-10-CM

## 2016-01-29 DIAGNOSIS — R001 Bradycardia, unspecified: Secondary | ICD-10-CM

## 2016-01-29 DIAGNOSIS — Z950 Presence of cardiac pacemaker: Secondary | ICD-10-CM

## 2016-01-29 DIAGNOSIS — I495 Sick sinus syndrome: Secondary | ICD-10-CM

## 2016-01-29 DIAGNOSIS — I4892 Unspecified atrial flutter: Secondary | ICD-10-CM

## 2016-01-29 DIAGNOSIS — E785 Hyperlipidemia, unspecified: Secondary | ICD-10-CM

## 2016-01-29 DIAGNOSIS — I10 Essential (primary) hypertension: Secondary | ICD-10-CM

## 2016-01-29 LAB — CUP PACEART INCLINIC DEVICE CHECK
Implantable Lead Implant Date: 20150403
Implantable Lead Implant Date: 20150403
Implantable Lead Location: 753859
Implantable Lead Model: 5076
Lead Channel Setting Pacing Amplitude: 2 V
Lead Channel Setting Sensing Sensitivity: 2 mV
MDC IDC LEAD LOCATION: 753860
MDC IDC SESS DTM: 20170718140404
MDC IDC SET LEADCHNL RA PACING AMPLITUDE: 1.5 V
MDC IDC SET LEADCHNL RV PACING PULSEWIDTH: 0.4 ms

## 2016-01-29 NOTE — Patient Instructions (Addendum)
Dr Croitoru recommends that you continue on your current medications as directed. Please refer to the Current Medication list given to you today.  Remote monitoring is used to monitor your Pacemaker of ICD from home. This monitoring reduces the number of office visits required to check your device to one time per year. It allows us to keep an eye on the functioning of your device to ensure it is working properly. You are scheduled for a device check from home on Tuesday, October 17th, 2017. You may send your transmission at any time that day. If you have a wireless device, the transmission will be sent automatically. After your physician reviews your transmission, you will receive a postcard with your next transmission date.  Dr Croitoru recommends that you schedule a follow-up appointment in 12 months with a pacemaker check. You will receive a reminder letter in the mail two months in advance. If you don't receive a letter, please call our office to schedule the follow-up appointment.  If you need a refill on your cardiac medications before your next appointment, please call your pharmacy. 

## 2016-01-29 NOTE — Progress Notes (Signed)
Cardiology Office Note    Date:  01/29/2016   ID:  Erik Lane, DOB 07/04/1951, MRN [id number]  PCP:  [name], MD  Cardiologist:  Roma Schanz, M.D.; Sanda Klein, MD   Chief Complaint  Patient presents with  . Follow-up    pt denied chest pain and SOB    History of Present Illness:  Erik Lane is a 65 y.o. male with an extensive history of coronary artery disease and previous bypass surgery, here for pacemaker check. His device is a dual-chamber Medtronic Adapta implanted April 2015 for sinus node dysfunction.  He last saw Dr. Ellyn Hack about a month ago. He was not feeling great and Dr. Ellyn Hack was considering possible need for cardiac catheterization. Since then he has not had chest pain or dyspnea. Looking back, he thinks that he was simply exhausted from working too hard.  Pacemaker interrogation shows normal device function. Anticipated longevity is 12 years. He has roughly 91% atrial pacing and only 0.3% ventricular pacing. He has not had [place] high ventricular rates. Only 6 very brief episodes of mode switch are recorded, all of them under 1 minute in duration.  He feels well and denies problems with chest pain, shortness of breath, edema, palpitations, syncope, focal neurological deficits, intermittent claudication.  Past Medical History  Diagnosis Date  . Symptomatic sinus bradycardia 1996    status post pacemaker placement, exchanged in 2003 (Medtronic Sigma, in DDDR)  . CAD in native artery 2005    multivessel CAD, ostial 50-60% LAD, mid LAD 75-80% after D1; OM 2 with 50-60% followed by 60% after OM1. RCA small vessel. --> CABG referral  . S/P CABG x 2 2005    LIMA-LAD, SVG-OM1, seq SVG-OM 2-OM-3  . 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 CAD (coronary artery disease), autologous vein bypass graft February 2007   Occluded SVG-OM1 with patent LIMA-LAD & seq SVG-OM2-3  . Status post cardiac pacemaker procedure 1996, 2003; 10-2013    Medtronic Adapta - replaced 10/2013  . Paroxysmal atrial fibrillation (HCC)     no recent documentation, although frequent mode switches noted but less than 2 minutes interval.  . Essential hypertension   . Dyslipidemia, goal LDL below 70     on statin  . Mild mitral regurgitation by prior echocardiogram 04/2013    Echo: EF 55-60%, G1 DD, mild MR  . Hernia of abdominal cavity     Past Surgical History  Procedure Laterality Date  . Coronary artery bypass graft  01/24/2004    LIMA-LAD, SVG-OM1, SVG-OM2/OM3 - Dr. Prescott Gum  . Pacemaker insertion  1996 ; 2003; 10-14-13    symptomatic bradycardia. generator change 2003 (Medtronic Sigma) DDDR; left sided device explanted 10-14-13 with new pacing system implatned on right side - MDT ADDRL1 by Dr Sallyanne Kuster  . Hernia repair    . Appendectomy    . Cardiac catheterization  01/17/2004    multivessel disease with osital 50-60% LAD, mid 70-80% after first diagonal, Cfx and OM2 had 50-60% lesion, 60% lesion after OM1 (Dr. Rockne Menghini)  . Cardiac catheterization  09/16/2005    SVG to OM1 occluded, other grafts patent, patent RCA, normal LV systolic function (Dr. Loni Muse. Little)  . Transthoracic echocardiogram  1996; 04/2013    normal LV function, mild LA dilatation;; b) 10/'14 - EF 55-60%, Gr 1 DD, mild LA dilation, Mild MR  . Nm myocar  perf wall motion  08/2005    mod inferolateral ischemia seen at apex and mid ventricular level, EF 62%  . Generator removal  10/14/2013    Procedure: GENERATOR REMOVAL;  Surgeon: Sanda Klein, MD;  Location: [facility];  Service: Cardiovascular;;    Current Medications: Outpatient Prescriptions Prior to Visit  Medication Sig Dispense Refill  . aspirin 325 MG EC tablet Take 325 mg by mouth daily.      Marland Kitchen atorvastatin (LIPITOR) 80 MG tablet TAKE 1/2 TABLET BY MOUTH EVERY DAY 30 tablet 4  . diltiazem (CARDIZEM CD)  180 MG 24 hr capsule Take 180 mg by mouth daily.    . Melatonin 5 MG CAPS Take 1 capsule (5 mg total) by mouth at bedtime as needed. 30 each 11  . metFORMIN (GLUCOPHAGE-XR) 500 MG 24 hr tablet Take 1 tablet (500 mg total) by mouth 2 (two) times daily.    Marland Kitchen NITROSTAT 0.4 MG SL tablet Place 1 tablet under the tongue every 15 (fifteen) minutes as needed.    . prednisoLONE acetate (PRED FORTE) 1 % ophthalmic suspension      No facility-administered medications prior to visit.     Allergies:   Review of patient's allergies indicates no known allergies.   Social History   Social History  . Marital Status: Widowed    Spouse Name: N/A  . Number of Children: 2  . Years of Education: N/A   Occupational History  .     Social History Main Topics  . Smoking status: Never Smoker   . Smokeless tobacco: Never Used  . Alcohol Use: No  . Drug Use: No  . Sexual Activity: Not on file   Other Topics Concern  . Not on file   Social History Narrative   He is a father of 2, grandfather 1. He does chew tobacco on daily basis but quit smoking 12 years ago. Never drinks a fall. 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     Family History:  The patient's family history includes Heart attack in his brother and father; Heart disease in his father; Heart failure in his father.   ROS:   Please see the history of present illness.    ROS All other systems reviewed and are negative.   PHYSICAL EXAM:   VS:  BP 147/84 mmHg  Pulse 62  Ht 6\' 1"  (1.854 m)  Wt 94.802 kg (209 lb)  BMI 27.58 kg/m2   GEN: Well nourished, well developed, in no acute distress HEENT: normal Neck: no JVD, carotid bruits, or masses Cardiac: RRR; no murmurs, rubs, or gallops,no edema , healthy pacemaker site Respiratory:  clear to auscultation bilaterally, normal work of breathing GI: soft, nontender, nondistended, + BS MS: no deformity or atrophy Skin:  warm and dry, no rash Neuro:  Alert and Oriented x 3, Strength and sensation are intact Psych: euthymic mood, full affect  Wt Readings from Last 3 Encounters:  01/29/16 94.802 kg (209 lb)  12/21/15 92.806 kg (204 lb 9.6 oz)  02/22/15 94.348 kg (208 lb)      Studies/Labs Reviewed:   EKG:  EKG is not ordered today.    Additional studies/ records that were reviewed today include:  Notes from June encounter with Dr. Glenetta Hew  Labs from December 2016 from primary care provider show hemoglobin A1c 7.3%, cholesterol 145, HDL 42, triglycerides 105, calculated LDL 82, normal liver function tests, fasting  glucose 131  ASSESSMENT:    1. SSS (sick sinus syndrome) ([place])   2. Pacemaker   3. Coronary artery disease involving autologous vein coronary bypass graft without angina pectoris   4. Paroxysmal atrial flutter (HCC)   5. Essential hypertension   6. Dyslipidemia, goal LDL below 70      PLAN:  In order of problems listed above:  1. SSS: Asymptomatic with appropriate pacemaker function, apical heart rate response with current sensor settings 2. PPM: Continue modalities or 3 months and office visits yearly 3. CAD: Currently asymptomatic 4. Reported past atrial flutter, none seen in a long time on device check. Very brief episodes of mode switch noted. He is not on anticoagulation therapy thus take high-dose aspirin. 5. HTN: Borderline high today, no changes made, should be monitored at home 6. HLP: He is on maximum dose atorvastatin with an LDL cholesterol that could be better. Notes recent development of hyperglycemia. Focus on weight loss and regular exercise to improve glycemic control. Consider adding Zetia if this does not succeed in bringing LDL to less than 70.    Medication Adjustments/Labs and Tests Ordered: Current medicines are reviewed at length with the patient today.  Concerns regarding medicines are outlined above.  Medication changes, Labs and Tests ordered today  are listed in the Patient Instructions below. There are no Patient Instructions on file for this visit.   Signed, Sanda Klein, MD  01/29/2016 9:33 AM    [facility] [address], [place], [place]  [address] Phone: [phone]; Fax: [phone]

## 2016-01-31 ENCOUNTER — Encounter: Payer: Self-pay | Admitting: Cardiovascular Disease

## 2016-01-31 DIAGNOSIS — I495 Sick sinus syndrome: Secondary | ICD-10-CM | POA: Insufficient documentation

## 2016-02-15 ENCOUNTER — Encounter: Payer: Self-pay | Admitting: Cardiology

## 2016-02-15 ENCOUNTER — Ambulatory Visit (INDEPENDENT_AMBULATORY_CARE_PROVIDER_SITE_OTHER): Payer: Managed Care, Other (non HMO) | Admitting: Cardiology

## 2016-02-15 ENCOUNTER — Encounter (INDEPENDENT_AMBULATORY_CARE_PROVIDER_SITE_OTHER): Payer: Self-pay

## 2016-02-15 VITALS — BP 145/77 | HR 65 | Ht 73.0 in | Wt 207.2 lb

## 2016-02-15 DIAGNOSIS — E785 Hyperlipidemia, unspecified: Secondary | ICD-10-CM

## 2016-02-15 DIAGNOSIS — R5383 Other fatigue: Secondary | ICD-10-CM

## 2016-02-15 DIAGNOSIS — I2510 Atherosclerotic heart disease of native coronary artery without angina pectoris: Secondary | ICD-10-CM | POA: Diagnosis not present

## 2016-02-15 DIAGNOSIS — I10 Essential (primary) hypertension: Secondary | ICD-10-CM

## 2016-02-15 DIAGNOSIS — I4892 Unspecified atrial flutter: Secondary | ICD-10-CM

## 2016-02-15 DIAGNOSIS — R0609 Other forms of dyspnea: Secondary | ICD-10-CM | POA: Diagnosis not present

## 2016-02-15 DIAGNOSIS — I25718 Atherosclerosis of autologous vein coronary artery bypass graft(s) with other forms of angina pectoris: Secondary | ICD-10-CM

## 2016-02-15 NOTE — Progress Notes (Signed)
PCP: [name], MD  Clinic Note: Chief Complaint  Patient presents with  . Follow-up    Pt states no new complaints  . Coronary Artery Disease    Still has some fatigue    HPI: Erik Lane is a 65 y.o. male with a PMH below who presents today for Delayed annual follow-up for CAD.  He has a long-standing history of CAD and cardiac disease with bradycardia dating back to 1996 requiring placement of a pacemaker. The most recent update was in April 2015.  Coronary artery disease was documented first in 2005 with multivessel disease-->CABG.  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 However the catheterization showed an occluded SVG to OM1 a patent SVG OM 2-OM3 and patent LIMA.  Ever since then he has not had any recurrent symptoms, but has also not had any followup stress test due to the "false positive test that was done in 2007. -- He did have a Myoview in 2016 which appeared to be relatively same as his last. Therefore we just monitored. Dr. 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  He reportedly has PAF, however no recent episodes of been documented.   I last saw him in March 2016. When I ask him about his pre-CABG symptoms, it was basically fatigue with some exertional dyspnea which is similar to what he is feeling now. Recent Hospitalizations: None  Studies Reviewed: No new studies  He was actually seen by Dr. Sallyanne Kuster since I last saw him, and apparently was feeling better then, stating that he just felt maybe he was overdoing it at work.  Interval History: Erik Lane presents today actually feeling overall better, but is not sure if it has to do with the fact that he now is not working as strenuous duty because of pain in his shoulder that is Different from you do the more strenuous job. He  has better energy is not feeling as fatigued, but still does get tired near the end of the day. As a result of this being less active, he is not is exhausted, but still by the midday is feeling tired and fatigued and short of breath.  Gets SOB & breathing hard walking up a flight of steps - dd not usually have this issue.   He also tells me he is not getting a lot of sleep. Is he's having a hard time getting full night of sleep, but has been scared to try any sleep aids.  Sleeps ~ 6 hrs - but less recently b/c shoulder pain. He denies any resting or exertional chest tightness or pressure, but does have some exertional dyspnea. He denies any PND, orthopnea or edema.  Denies any rapid irregular heartbeats or palpitations.  No lightheadedness, dizziness, weakness or syncope/near syncope. No TIA/amaurosis fugax symptoms. No melena, hematochezia, hematuria, or epstaxis. No claudication.  ROS: A comprehensive was performed. Review of Systems  Constitutional: Positive for malaise/fatigue.  HENT: Negative for congestion.   Respiratory: Negative for cough, shortness of breath and wheezing.   Cardiovascular: Negative for claudication.  Gastrointestinal: Negative for blood in stool and melena.  Genitourinary: Negative for hematuria.  Musculoskeletal: Positive for joint pain (Significant right shoulder pain.). Negative for falls and myalgias.  Neurological: Negative for headaches.  Endo/Heme/Allergies: Does not bruise/bleed easily.  Psychiatric/Behavioral: Negative for depression and  memory loss. The patient is not nervous/anxious and does not have insomnia.   All other systems reviewed and are negative.   Past Medical History:  Diagnosis Date  . Abnormal nuclear stress test March 2007; March 2016   for DOT physical/CDL license: Inferolateral ischemia - Considered False-Positive, but cath showed 100% SVG-OM occlusion; b.medium size, moderate intensity partially reversible anterolateral defect c/w  prior MI & per-infarct ischemia, normal wall motion& EF - considered c/w anatomy.  . CAD (coronary artery disease), autologous vein bypass graft February 2007   Occluded SVG-OM1 with patent LIMA-LAD & seq SVG-OM2-3  . CAD in native artery 2005   multivessel CAD, ostial 50-60% LAD, mid LAD 75-80% after D1; OM 2 with 50-60% followed by 60% after OM1. RCA small vessel. --> CABG referral  . Dyslipidemia, goal LDL below 70    on statin  . Essential hypertension   . Hernia of abdominal cavity   . Mild mitral regurgitation by prior echocardiogram 04/2013   Echo: EF 55-60%, G1 DD, mild MR  . Paroxysmal atrial fibrillation (HCC)    no recent documentation, although frequent mode switches noted but less than 2 minutes interval.  . S/P CABG x 2 2005   LIMA-LAD, SVG-OM1, seq SVG-OM 2-OM-3  . Status post cardiac pacemaker procedure 1996, 2003; 10-2013   Medtronic Adapta - replaced 10/2013  . Symptomatic sinus bradycardia 1996   status post pacemaker placement, exchanged in 2003 (Medtronic Sigma, in DDDR)    Past Surgical History:  Procedure Laterality Date  . APPENDECTOMY    . CARDIAC CATHETERIZATION  01/17/2004   multivessel disease with osital 50-60% LAD, mid 70-80% after first diagonal, Cfx and OM2 had 50-60% lesion, 60% lesion after OM1 (Dr. Rockne Menghini)  . CARDIAC CATHETERIZATION  09/16/2005   SVG to OM1 occluded, other grafts patent, patent RCA, normal LV systolic function (Dr. Loni Muse. Little)  . CORONARY ARTERY BYPASS GRAFT  01/24/2004   LIMA-LAD, SVG-OM1, SVG-OM2/OM3 - Dr. Prescott Gum  . GENERATOR REMOVAL  10/14/2013   Procedure: GENERATOR REMOVAL;  Surgeon: Sanda Klein, MD;  Location: [facility];  Service: Cardiovascular;;  . HERNIA REPAIR    . NM MYOCAR PERF WALL MOTION  08/2005   mod inferolateral ischemia seen at apex and mid ventricular level, EF 62%  . Helper ; 2003; 10-14-13   symptomatic bradycardia. generator change 2003 (Medtronic Sigma) DDDR; left sided device explanted  10-14-13 with new pacing system implatned on right side - MDT ADDRL1 by Dr Sallyanne Kuster  . TRANSTHORACIC ECHOCARDIOGRAM  1996; 04/2013   normal LV function, mild LA dilatation;; b) 10/'14 - EF 55-60%, Gr 1 DD, mild LA dilation, Mild MR    Myoview in 09/2014 - Overall Impression:  Low risk stress nuclear study with a medium size, moderate intensity, partially reversible anterolateral defect consistent with prior infarct and mild peri-infarct ischemia. LV Wall Motion:  NL LV Function; NL Wall Motion  Prior to Admission medications   Medication Sig Start Date End Date Taking? Authorizing Provider  aspirin 325 MG EC tablet Take 325 mg by mouth daily.     Yes Historical Provider, MD  atorvastatin (LIPITOR) 80 MG tablet TAKE 1/2 TABLET BY MOUTH EVERY DAY 06/11/15  Yes Leonie Man, MD  diltiazem (CARDIZEM CD) 180 MG 24 hr capsule Take 180 mg by mouth daily. 04/11/13  Yes Historical Provider, MD  metFORMIN (GLUCOPHAGE-XR) 500 MG 24 hr tablet Take 1 tablet (500 mg total) by mouth 2 (two) times daily. 10/16/13  Yes Dayna N Dunn, PA-C  NITROSTAT 0.4 MG SL tablet Place 1 tablet under the tongue every 15 (fifteen) minutes as needed. 10/03/13  Yes Historical Provider, MD  prednisoLONE acetate (PRED FORTE) 1 % ophthalmic suspension  07/18/15  Yes Historical Provider, MD   No Known Allergies   Social History   Social History  . Marital status: Widowed    Spouse name: N/A  . Number of children: 2  . Years of education: N/A   Occupational History  .  [employer]   Social History Main Topics  . Smoking status: Never Smoker  . Smokeless tobacco: Never Used  . Alcohol use No  . Drug use: No  . Sexual activity: Not Asked   Other Topics Concern  . None   Social History Narrative   He is a father of 2, grandfather 32. He does chew tobacco on daily basis but quit smoking 12 years ago. Never drinks a fall. 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   Family History  Problem Relation Age of Onset  . Heart disease Father   . Heart attack Father   . Heart failure Father   . Heart attack Brother      Wt Readings from Last 3 Encounters:  02/15/16 207 lb 3.2 oz (94 kg)  01/29/16 209 lb (94.8 kg)  12/21/15 204 lb 9.6 oz (92.8 kg)    PHYSICAL EXAM BP (!) 145/77   Pulse 65   Ht 6\' 1"  (1.854 m)   Wt 207 lb 3.2 oz (94 kg)   BMI 27.34 kg/m  General appearance: A&O x 3, cooperative, appears stated age, no distress and well-nourished, well-groomed HEENT: [place]/AT, EOMI, MMM, anicteric sclera Neck: no adenopathy, no carotid bruit and no JVD Lungs: CTAB, normal percussion bilaterally and non-labored Heart: RRR, S1, split S2 normal, no murmur, click, rub or gallop; non-displaced PMI Abdomen: soft, non-tender; bowel sounds normal; no masses, no organomegaly;  Extremities: extremities normal, atraumatic, no cyanosis, and trivial edema  Pulses: 2+ and symmetric;  Neurologic: Mental status: Alert, oriented, thought content appropriate Cranial nerves: normal (II-XII grossly intact)   Adult ECG Report Not checked  Other studies Reviewed: Additional studies/ records that were reviewed today include:  Recent Labs: 07/10/2015  Na+ 140, K+ 4.7, Cl- 99, HCO3- 22 , BUN 13, Cr 0.95, Glu 101, Ca2+ 9.4; AST 21, ALT 24, AlkP 86, Alb 4.6, TP 7.0, T Bili 0.9  CBC: W 8.3, H/H 16.0/46.5, Plt 224  TC 145, TG 105, HDL 42, LDL 82   ASSESSMENT / PLAN: Over 25 minutes time spent with the patient. > 50 % of the time was spent in direct consutlation -- talking with the patient about his symptoms and potential diagnostic options.   Problem List Items Addressed This Visit    Paroxysmal atrial flutter (HCC) (Chronic)    No obvious recent recurrences. As a result, not on anticoagulation.      Relevant Medications   atorvastatin (LIPITOR) 40 MG tablet   Excessive postexertional fatigue (Chronic)    This is probably  multifactorial, and may have something to do with not good sleep habits, as well as excessive work. However this is very similar symptom that he had prior to his CABG. He is not noticing any chest tightness or pressure but does note dyspnea and fatigue after or with significant exertion. At this point it he still seems to be somewhat concerned  about his symptoms, less so than he was couple months ago, but still has concern. We discussed in detail whether or not we would just simply go forward with cardiac catheterization or noninvasive study. We now have a baseline study that showed an anterolateral defect that is consistent with the OM graft occlusion. If he would have a stress test done now that would show similar findings to that study, then we would likely considered to be stable. However if there is a new area of concern or more ischemia, then I would be more inclined to proceed with cardiac catheterization just to exclude new graft disease.  PLAN: Lexiscan Myoview, then follow-up after study      Essential hypertension (Chronic)    Blood pressure is little high today. Continue to monitor, would potentially consider ARB/ACE inhibitor      Relevant Medications   atorvastatin (LIPITOR) 40 MG tablet   Dyslipidemia, goal LDL below 70 (Chronic)    Has been doing well, close to goal on labs. Continue moderate dose statin. Labs are followed by his PCP, last checked in December 2016. Should be due to be checked relatively soon      Relevant Medications   atorvastatin (LIPITOR) 40 MG tablet   CAD in native artery (Chronic)    Now having symptoms similar to his pre-CABG symptoms. Evaluating the new ischemia with Myoview. Continue aspirin, statin and diltiazem.      Relevant Medications   atorvastatin (LIPITOR) 40 MG tablet   CAD (coronary artery disease), autologous vein bypass graft (Chronic)    We now have 2 different stress tests that were separated by many years. Angiographically we know  he has an occluded OM graft. His last stress test in 2016 was in the absence of symptoms, and we could explain the perfusion defect with his known anatomy. We now have symptoms, and I would like to assess for additional graft disease.   Plan: Repeat Myoview stress test. Would like to compare to old study   Continue aspirin, statin and diltiazem which isn't resectable antianginal replacement for beta blocker which made him more fatigued. Not on ARB or ACE inhibitor at present.      Relevant Medications   atorvastatin (LIPITOR) 40 MG tablet    Other Visit Diagnoses    DOE (dyspnea on exertion)    -  Primary   Relevant Orders   Myocardial Perfusion Imaging      Current medicines are reviewed at length with the patient today. (+/- concerns) None The following changes have been made: None   Your physician recommends that you schedule a follow-up appointment LATE November   Studies Ordered:   Orders Placed This Encounter  Procedures  . Myocardial Perfusion Imaging      Glenetta Hew, M.D., M.S. Interventional Cardiologist   Pager # [phone] Phone # [phone] [address] [place], [place] [address]

## 2016-02-15 NOTE — Patient Instructions (Signed)
Medication Instructions:   NO CHANGE  Testing/Procedures:  Your physician has requested that you have a lexiscan myoview. For further information please visit [email or web address]. Please follow instruction sheet, as given.    Follow-Up:  Your physician recommends that you schedule a follow-up appointment in: LATE November WITH DR [facility]   If you need a refill on your cardiac medications before your next appointment, please call your pharmacy.

## 2016-02-17 ENCOUNTER — Encounter: Payer: Self-pay | Admitting: Cardiology

## 2016-02-17 DIAGNOSIS — R5383 Other fatigue: Secondary | ICD-10-CM | POA: Insufficient documentation

## 2016-02-17 NOTE — Assessment & Plan Note (Addendum)
We now have 2 different stress tests that were separated by many years. Angiographically we know he has an occluded OM graft. His last stress test in 2016 was in the absence of symptoms, and we could explain the perfusion defect with his known anatomy. We now have symptoms, and I would like to assess for additional graft disease.   Plan: Repeat Myoview stress test. Would like to compare to old study   Continue aspirin, statin and diltiazem which isn't resectable antianginal replacement for beta blocker which made him more fatigued. Not on ARB or ACE inhibitor at present.

## 2016-02-17 NOTE — Assessment & Plan Note (Signed)
Blood pressure is little high today. Continue to monitor, would potentially consider ARB/ACE inhibitor

## 2016-02-17 NOTE — Assessment & Plan Note (Signed)
This is probably multifactorial, and may have something to do with not good sleep habits, as well as excessive work. However this is very similar symptom that he had prior to his CABG. He is not noticing any chest tightness or pressure but does note dyspnea and fatigue after or with significant exertion. At this point it he still seems to be somewhat concerned about his symptoms, less so than he was couple months ago, but still has concern. We discussed in detail whether or not we would just simply go forward with cardiac catheterization or noninvasive study. We now have a baseline study that showed an anterolateral defect that is consistent with the OM graft occlusion. If he would have a stress test done now that would show similar findings to that study, then we would likely considered to be stable. However if there is a new area of concern or more ischemia, then I would be more inclined to proceed with cardiac catheterization just to exclude new graft disease.  PLAN: Lexiscan Myoview, then follow-up after study

## 2016-02-17 NOTE — Assessment & Plan Note (Signed)
Now having symptoms similar to his pre-CABG symptoms. Evaluating the new ischemia with Myoview. Continue aspirin, statin and diltiazem.

## 2016-02-17 NOTE — Assessment & Plan Note (Addendum)
Has been doing well, close to goal on labs. Continue moderate dose statin. Labs are followed by his PCP, last checked in December 2016. Should be due to be checked relatively soon

## 2016-02-17 NOTE — Assessment & Plan Note (Signed)
No obvious recent recurrences. As a result, not on anticoagulation.

## 2016-02-21 ENCOUNTER — Telehealth (HOSPITAL_COMMUNITY): Payer: Self-pay

## 2016-02-21 NOTE — Telephone Encounter (Signed)
Encounter complete. 

## 2016-02-26 ENCOUNTER — Ambulatory Visit (HOSPITAL_COMMUNITY)
Admission: RE | Admit: 2016-02-26 | Discharge: 2016-02-26 | Disposition: A | Discharge status: Home / Self Care | Payer: Managed Care, Other (non HMO) | Source: Ambulatory Visit | Attending: Cardiology | Admitting: Cardiology

## 2016-02-26 DIAGNOSIS — R9439 Abnormal result of other cardiovascular function study: Secondary | ICD-10-CM | POA: Insufficient documentation

## 2016-02-26 DIAGNOSIS — I10 Essential (primary) hypertension: Secondary | ICD-10-CM | POA: Diagnosis not present

## 2016-02-26 DIAGNOSIS — E119 Type 2 diabetes mellitus without complications: Secondary | ICD-10-CM | POA: Insufficient documentation

## 2016-02-26 DIAGNOSIS — R5383 Other fatigue: Secondary | ICD-10-CM | POA: Insufficient documentation

## 2016-02-26 DIAGNOSIS — R002 Palpitations: Secondary | ICD-10-CM | POA: Insufficient documentation

## 2016-02-26 DIAGNOSIS — R0609 Other forms of dyspnea: Secondary | ICD-10-CM | POA: Diagnosis not present

## 2016-02-26 DIAGNOSIS — R0602 Shortness of breath: Secondary | ICD-10-CM | POA: Diagnosis not present

## 2016-02-26 DIAGNOSIS — Z8249 Family history of ischemic heart disease and other diseases of the circulatory system: Secondary | ICD-10-CM | POA: Insufficient documentation

## 2016-02-26 HISTORY — PX: NM MYOCAR PERF WALL MOTION: HXRAD629

## 2016-02-26 LAB — MYOCARDIAL PERFUSION IMAGING
LV dias vol: 125 mL (ref 62–150)
LVSYSVOL: 59 mL
Peak HR: 61 {beats}/min
Rest HR: 61 {beats}/min
SDS: 1
SRS: 6
SSS: 7
TID: 1.07

## 2016-02-26 MED ORDER — REGADENOSON 0.4 MG/5ML IV SOLN
0.4000 mg | Freq: Once | INTRAVENOUS | Status: AC
  Administered 2016-02-26: 0.4 mg via INTRAVENOUS

## 2016-02-26 MED ORDER — TECHNETIUM TC 99M TETROFOSMIN IV KIT
31.2000 | PACK | Freq: Once | INTRAVENOUS | Status: AC | PRN
  Administered 2016-02-26: 31.2 via INTRAVENOUS
  Filled 2016-02-26: qty 31

## 2016-02-26 MED ORDER — TECHNETIUM TC 99M TETROFOSMIN IV KIT
10.7000 | PACK | Freq: Once | INTRAVENOUS | Status: AC | PRN
  Administered 2016-02-26: 10.7 via INTRAVENOUS
  Filled 2016-02-26: qty 11

## 2016-04-29 ENCOUNTER — Telehealth: Payer: Self-pay | Admitting: Cardiology

## 2016-04-29 ENCOUNTER — Ambulatory Visit (INDEPENDENT_AMBULATORY_CARE_PROVIDER_SITE_OTHER): Payer: Managed Care, Other (non HMO) | Admitting: *Deleted

## 2016-04-29 DIAGNOSIS — I495 Sick sinus syndrome: Secondary | ICD-10-CM

## 2016-04-29 NOTE — Telephone Encounter (Signed)
Spoke with pt and reminded pt of remote transmission that is due today. Pt verbalized understanding.   

## 2016-04-30 ENCOUNTER — Encounter: Payer: Self-pay | Admitting: Cardiology

## 2016-04-30 NOTE — Progress Notes (Signed)
Remote pacemaker transmission.   

## 2016-05-09 LAB — CUP PACEART REMOTE DEVICE CHECK
Battery Impedance: 187 Ohm
Battery Voltage: 2.8 V
Brady Statistic AP VS Percent: 90 %
Brady Statistic AS VS Percent: 10 %
Date Time Interrogation Session: 20171018085127
Implantable Lead Implant Date: 20150403
Implantable Lead Model: 5076
Lead Channel Impedance Value: 564 Ohm
Lead Channel Impedance Value: 641 Ohm
Lead Channel Pacing Threshold Amplitude: 0.75 V
Lead Channel Pacing Threshold Pulse Width: 0.4 ms
Lead Channel Pacing Threshold Pulse Width: 0.4 ms
Lead Channel Setting Pacing Amplitude: 2 V
Lead Channel Setting Sensing Sensitivity: 2 mV
MDC IDC LEAD IMPLANT DT: 20150403
MDC IDC LEAD LOCATION: 753859
MDC IDC LEAD LOCATION: 753860
MDC IDC MSMT BATTERY REMAINING LONGEVITY: 141 mo
MDC IDC MSMT LEADCHNL RV PACING THRESHOLD AMPLITUDE: 0.625 V
MDC IDC MSMT LEADCHNL RV SENSING INTR AMPL: 5.6 mV
MDC IDC SET LEADCHNL RA PACING AMPLITUDE: 1.5 V
MDC IDC SET LEADCHNL RV PACING PULSEWIDTH: 0.4 ms
MDC IDC STAT BRADY AP VP PERCENT: 0 %
MDC IDC STAT BRADY AS VP PERCENT: 0 %

## 2016-06-09 ENCOUNTER — Ambulatory Visit: Payer: Managed Care, Other (non HMO) | Admitting: Cardiology

## 2016-06-16 ENCOUNTER — Ambulatory Visit (INDEPENDENT_AMBULATORY_CARE_PROVIDER_SITE_OTHER): Payer: Managed Care, Other (non HMO) | Admitting: Cardiology

## 2016-06-16 ENCOUNTER — Encounter: Payer: Self-pay | Admitting: Cardiology

## 2016-06-16 VITALS — BP 128/80 | HR 62 | Ht 73.0 in | Wt 212.0 lb

## 2016-06-16 DIAGNOSIS — I4892 Unspecified atrial flutter: Secondary | ICD-10-CM

## 2016-06-16 DIAGNOSIS — R5383 Other fatigue: Secondary | ICD-10-CM

## 2016-06-16 DIAGNOSIS — I10 Essential (primary) hypertension: Secondary | ICD-10-CM

## 2016-06-16 DIAGNOSIS — I2510 Atherosclerotic heart disease of native coronary artery without angina pectoris: Secondary | ICD-10-CM

## 2016-06-16 DIAGNOSIS — I25810 Atherosclerosis of coronary artery bypass graft(s) without angina pectoris: Secondary | ICD-10-CM | POA: Diagnosis not present

## 2016-06-16 DIAGNOSIS — R001 Bradycardia, unspecified: Secondary | ICD-10-CM

## 2016-06-16 DIAGNOSIS — E785 Hyperlipidemia, unspecified: Secondary | ICD-10-CM

## 2016-06-16 NOTE — Progress Notes (Signed)
PCP: [name], MD  Clinic Note: Chief Complaint  Patient presents with  . Follow-up    4 month  . Leg Pain    sorness in both legs    HPI: Erik Lane is a 65 y.o. male with a PMH below who presents today for 6 month f/u  For CAD-CABG; With recent Myoview stress test. He has a long-standing history of CAD and cardiac disease with bradycardia dating back to 1996 requiring placement of a pacemaker. The most recent update was in April 2015.  Coronary artery disease was documented first in 2005 with multivessel disease-->CABG.   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   He did have a Myoview in 2016 which appeared to be relatively same as his last. Therefore we just monitored. Dr. 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  He reportedly has PAF, however no recent episodes of been documented.  YANSEL BENTZ was last seen on 02/15/2016 -- we ordered a Myoview stress test for CABG follow-up given his DOE that he indicated was similar to his prior "anginal equivalent". He noted that he was not as short of breath when I saw him in follow-up because his job and become less strenuous. He wasn't getting as started in the day.  Recent Hospitalizations: none  Studies Reviewed:  Myoview 02/26/2016: LOW RISK. EF 53%. Post CABG septal wall hypokinesis. There is a small sized mild severity defect in the mid anterolateral wall with no ischemia. Likely related to prior infarct. Consistent with prior study.   -- consistent with known occluded SVG-OM1.  Interval History: Erik Lane presents today actually feeling relatively well. He has not had anymore of the  concerning dyspnea type symptoms he had had. He is not really having any exertional tightness in his chest or significant dyspnea. When he is noticing though is these having some left greater than right calf pain that seems to present all day but is sometimes exacerbated with activity.  When I saw him back in August he was noticing having dyspnea going up a flight of steps, but this doesn't seem to be the case now. He is getting better sleep now and seems to be less tired. He is less troubled by his shoulder pain which may be part of the improved symptoms.  No chest pain  with rest or exertion. He still has some exertional dyspnea, but overall better and stable. No PND, orthopnea or edema. No palpitations, lightheadedness, dizziness, weakness or syncope/near syncope. No TIA/amaurosis fugax symptoms. No melena, hematochezia, hematuria, or epstaxis. ? L calf claudication.  ROS: A comprehensive was performed. Review of Systems  Constitutional: Positive for malaise/fatigue (energy seems to have improved).  HENT: Negative for congestion and nosebleeds.   Respiratory: Negative for cough, shortness of breath and wheezing.   Cardiovascular:       Per HPI  Gastrointestinal: Negative for blood in stool, constipation, heartburn and melena.  Musculoskeletal: Positive for joint pain (R shoulder does not seem to be bothering him as much) and myalgias (See HPI).  Neurological: Negative for dizziness and seizures.  Endo/Heme/Allergies: Negative for environmental allergies. Does not bruise/bleed easily.  Psychiatric/Behavioral: The patient  has insomnia (sleeping better ).   All other systems reviewed and are negative.   Past Medical History:  Diagnosis Date  . Abnormal nuclear stress test March 2007; March 2016   for DOT physical/CDL license: Inferolateral ischemia - Considered False-Positive, but cath showed 100% SVG-OM occlusion; b.medium size, moderate intensity partially reversible anterolateral  defect c/w prior MI & per-infarct ischemia, normal wall motion& EF - considered c/w anatomy.  . CAD (coronary artery disease), autologous vein bypass graft February 2007   Occluded SVG-OM1 with patent LIMA-LAD & seq SVG-OM2-3  . CAD in native artery 2005   multivessel CAD, ostial 50-60% LAD, mid LAD 75-80% after D1; OM 2 with 50-60% followed by 60% after OM1. RCA small vessel. --> CABG referral  . Dyslipidemia, goal LDL below 70    on statin  . Essential hypertension   . Hernia of abdominal cavity   . Mild mitral regurgitation by prior echocardiogram 04/2013   Echo: EF 55-60%, G1 DD, mild MR  . Paroxysmal atrial fibrillation (HCC)    no recent documentation, although frequent mode switches noted but less than 2 minutes interval.  . S/P CABG x 2 2005   LIMA-LAD, SVG-OM1, seq SVG-OM 2-OM-3  . Status post cardiac pacemaker procedure 1996, 2003; 10-2013   Medtronic Adapta - replaced 10/2013  . Symptomatic sinus bradycardia 1996   status post pacemaker placement, exchanged in 2003 (Medtronic Sigma, in DDDR)    Past Surgical History:  Procedure Laterality Date  . APPENDECTOMY    . CARDIAC CATHETERIZATION  01/17/2004   multivessel disease with osital 50-60% LAD, mid 70-80% after first diagonal, Cfx and OM2 had 50-60% lesion, 60% lesion after OM1 (Dr. Rockne Menghini)  . CARDIAC CATHETERIZATION  09/16/2005   SVG to OM1 occluded, other grafts patent, patent RCA, normal LV systolic function (Dr. Loni Muse. Little)  . CORONARY ARTERY BYPASS GRAFT  01/24/2004   LIMA-LAD, SVG-OM1, SVG-OM2/OM3 - Dr. Prescott Gum  . GENERATOR REMOVAL  10/14/2013   Procedure: GENERATOR REMOVAL;  Surgeon: Sanda Klein, MD;  Location: [facility];  Service: Cardiovascular;;  . HERNIA REPAIR    . NM MYOCAR PERF WALL MOTION  08/2005   mod inferolateral ischemia seen at apex and mid ventricular level, EF 62%  . [place] ; 2003; 10-14-13   symptomatic bradycardia. generator change 2003 (Medtronic Sigma) DDDR; left sided device  explanted 10-14-13 with new pacing system implatned on right side - MDT ADDRL1 by Dr Sallyanne Kuster  . TRANSTHORACIC ECHOCARDIOGRAM  1996; 04/2013   normal LV function, mild LA dilatation;; b) 10/'14 - EF 55-60%, Gr 1 DD, mild LA dilation, Mild MR   Current Meds  Medication Sig  . aspirin 325 MG EC tablet Take 325 mg by mouth daily.    Marland Kitchen atorvastatin (LIPITOR) 40 MG tablet Take 40 mg by mouth daily.   Marland Kitchen diltiazem (CARDIZEM CD) 180 MG 24 hr capsule Take 180 mg by mouth daily.  Marland Kitchen ibuprofen (ADVIL,MOTRIN) 200 MG tablet Take 200 mg by mouth daily as needed.  . metFORMIN (GLUCOPHAGE-XR) 500 MG 24 hr tablet Take 1,000 mg by mouth 2 (two) times daily.  Marland Kitchen NITROSTAT 0.4 MG SL tablet Place 1 tablet under the tongue every 15 (fifteen) minutes as needed.   No Known Allergies   Social History   Social History  . Marital status: Widowed    Spouse name: N/A  . Number of children: 2  . Years of education: N/A   Occupational History  .  [employer] features editor  [employer]   Social History Main Topics  . Smoking status: Never Smoker  . Smokeless tobacco: Never Used  . Alcohol use No  . Drug use: No  . Sexual activity: Not Asked   Other Topics Concern  . None   Social History Narrative   He is a father of 2, grandfather 33. He does chew tobacco on daily basis but quit smoking 12 years ago. Never drinks a fall. 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   Family History  Problem Relation Age of Onset  . Heart disease Father   . Heart attack Father   . Heart failure Father   . Heart attack Brother     Wt Readings from Last 3 Encounters:  06/16/16 96.2 kg (212 lb)  02/26/16 93.9 kg (207 lb)  02/15/16 94 kg (207 lb 3.2 oz)  **  PHYSICAL EXAM BP 128/80   Pulse 62   Ht 6\' 1"  (1.854 m)   Wt 96.2 kg (212 lb)   BMI 27.97 kg/m  General appearance: A&O x 3, cooperative, appears stated age, no distress and well-nourished, well-groomed HEENT:  [place]/AT, EOMI, MMM, anicteric sclera Neck: no adenopathy, no carotid bruit and no JVD Lungs: CTAB, normal percussion bilaterally and non-labored Heart: RRR, S1, split S2 normal, no murmur, click, rub or gallop; non-displaced PMI Abdomen: soft, non-tender; bowel sounds normal; no masses, no organomegaly;  Extremities: extremities normal, atraumatic, no cyanosis, and trivial edema  Pulses: 2+ and symmetric;  Neurologic: Mental status: Alert, oriented, thought content appropriate   Adult ECG Report n/a  Other studies Reviewed: Additional studies/ records that were reviewed today include:  Recent Labs:  Checked by PCP in November (not available) -- A1c 7.7 - increased Metformin to 1000 mg BID    ASSESSMENT / PLAN: Problem List Items Addressed This Visit    Coronary artery disease involving native heart without angina pectoris - Primary (Chronic)    1I saw him back in August, he was having some exertional dyspnea symptoms that were somewhat concerning. Relatively reassuring Myoview with no evidence of ischemia. He simply had a fixed anterolateral defect that is probably related to the occluded SVG-OM - to moderate prior study.  Not on beta blocker due to fatigue. He is on standing dose of diltiazem which is acceptable with preserved EF. He is on aspirin and statin.      Excessive postexertional fatigue (Chronic)    This seems to have improved now that is getting better sleep with his shoulder feeling better. Reassured by the [facility] results. He also preserved EF. There could be some diastolic dysfunction, or he could potentially be having some A. fib but has not had any other palpitation symptoms.  As his symptoms seem to stabilize, for now we will just continue current regimen.      Essential hypertension (Chronic)    Blood pressure looks much better today on current dose of diltiazem. Continue current meds.      Dyslipidemia, goal LDL below 70 (Chronic)    Labs being  followed by PCP. He is on statin. However with his calf pain I would like him to do a one-month statin holiday. If symptoms are not improved at that time, he would sit to restart statin. If they do improve, then we would probably want to change to a different statin such as Livalo or Crestor. We could also consider ABIs to evaluate for PAD.  symptomatic bradycardia -- status post pacemaker (Chronic)    Status post pacemaker placement. No report on patient evaluation as to whether or not he has had any A. fib.      Paroxysmal atrial flutter (HCC) (Chronic)    No recent documentation. Therefore not an endocrine relation. I think this is probably peri-infarct.      CAD (coronary artery disease), autologous vein bypass graft (Chronic)    Third stress test suggesting a fixed defect involving the occluded vein graft to the OM. No evidence of ischemia to suggest that any of the other grafts are closed.  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  I really don't think that a treadmill GXT would be helpful in the future based on prior evaluations.         Current medicines are reviewed at length with the patient today. (+/- concerns) Calf myalgias all day The following changes have been made: Statin Holiday.  Patient Instructions  FOR YOUR MUSCLE ACHES- TAKE A "STATIN HOLIDAY" STOP CHOLESTEROL MEDICATION FOR A MONTH TO SEE HOW YOU FEEL. If symptoms go away  Primary can adjust mediations. If pain is still occurring it probably not medication should restart and discuss with primary about other options.  No other changes at current time   When you which place you will getting D.O.T. physical  , call office and ask for records to be faxed to that facility. ( ask for Dr Allison Quarry nurse-- 2 latest offce notes and myoview will be sent)  Your physician wants you to follow-up in: 12 months DR Vannak Montenegro.You will receive a reminder letter in the mail two months in  advance. If you don't receive a letter, please call our office to schedule the follow-up appointment.   If you need a refill on your cardiac medications before your next appointment, please call your pharmacy.     Studies Ordered:   No orders of the defined types were placed in this encounter.     Glenetta Hew, M.D., M.S. Interventional Cardiologist   Pager # [phone] Phone # [phone] [address] [place], [place] [address]

## 2016-06-16 NOTE — Patient Instructions (Signed)
FOR YOUR MUSCLE ACHES- TAKE A "STATIN HOLIDAY" STOP CHOLESTEROL MEDICATION FOR A MONTH TO SEE HOW YOU FEEL. If symptoms go away  Primary can adjust mediations. If pain is still occurring it probably not medication should restart and discuss with primary about other options.  No other changes at current time   When you which place you will getting D.O.T. physical  , call office and ask for records to be faxed to that facility. ( ask for Dr Allison Quarry nurse-- 2 latest offce notes and myoview will be sent)  Your physician wants you to follow-up in: 12 months DR HARDING.You will receive a reminder letter in the mail two months in advance. If you don't receive a letter, please call our office to schedule the follow-up appointment.   If you need a refill on your cardiac medications before your next appointment, please call your pharmacy.

## 2016-06-18 ENCOUNTER — Encounter: Payer: Self-pay | Admitting: Cardiology

## 2016-06-18 NOTE — Assessment & Plan Note (Signed)
No recent documentation. Therefore not an endocrine relation. I think this is probably peri-infarct.

## 2016-06-18 NOTE — Assessment & Plan Note (Signed)
Blood pressure looks much better today on current dose of diltiazem. Continue current meds.

## 2016-06-18 NOTE — Assessment & Plan Note (Signed)
This seems to have improved now that is getting better sleep with his shoulder feeling better. Reassured by the [facility] results. He also preserved EF. There could be some diastolic dysfunction, or he could potentially be having some A. fib but has not had any other palpitation symptoms.  As his symptoms seem to stabilize, for now we will just continue current regimen.

## 2016-06-18 NOTE — Assessment & Plan Note (Signed)
1I saw him back in August, he was having some exertional dyspnea symptoms that were somewhat concerning. Relatively reassuring Myoview with no evidence of ischemia. He simply had a fixed anterolateral defect that is probably related to the occluded SVG-OM - to moderate prior study.  Not on beta blocker due to fatigue. He is on standing dose of diltiazem which is acceptable with preserved EF. He is on aspirin and statin.

## 2016-06-18 NOTE — Assessment & Plan Note (Signed)
Third stress test suggesting a fixed defect involving the occluded vein graft to the OM. No evidence of ischemia to suggest that any of the other grafts are closed.  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  I really don't think that a treadmill GXT would be helpful in the future based on prior evaluations.

## 2016-06-18 NOTE — Assessment & Plan Note (Signed)
Status post pacemaker placement. No report on patient evaluation as to whether or not he has had any A. fib.

## 2016-06-18 NOTE — Assessment & Plan Note (Signed)
Labs being followed by PCP. He is on statin. However with his calf pain I would like him to do a one-month statin holiday. If symptoms are not improved at that time, he would sit to restart statin. If they do improve, then we would probably want to change to a different statin such as Livalo or Crestor. We could also consider ABIs to evaluate for PAD.

## 2016-07-29 ENCOUNTER — Ambulatory Visit (INDEPENDENT_AMBULATORY_CARE_PROVIDER_SITE_OTHER): Payer: Managed Care, Other (non HMO) | Admitting: *Deleted

## 2016-07-29 ENCOUNTER — Telehealth: Payer: Self-pay | Admitting: Cardiology

## 2016-07-29 DIAGNOSIS — I495 Sick sinus syndrome: Secondary | ICD-10-CM

## 2016-07-29 NOTE — Telephone Encounter (Signed)
Spoke with pt and reminded pt of remote transmission that is due today. Pt verbalized understanding.   

## 2016-08-01 NOTE — Progress Notes (Signed)
Remote pacemaker transmission.   

## 2016-08-04 LAB — CUP PACEART REMOTE DEVICE CHECK
Battery Impedance: 138 Ohm
Battery Remaining Longevity: 153 mo
Brady Statistic AP VP Percent: 0 %
Implantable Lead Implant Date: 20150403
Implantable Lead Location: 753859
Implantable Lead Model: 5076
Implantable Lead Model: 5076
Implantable Pulse Generator Implant Date: 20150403
Lead Channel Impedance Value: 522 Ohm
Lead Channel Pacing Threshold Amplitude: 0.75 V
Lead Channel Pacing Threshold Pulse Width: 0.4 ms
Lead Channel Setting Pacing Amplitude: 1.5 V
Lead Channel Setting Pacing Amplitude: 2 V
Lead Channel Setting Pacing Pulse Width: 0.4 ms
Lead Channel Setting Sensing Sensitivity: 2 mV
MDC IDC LEAD IMPLANT DT: 20150403
MDC IDC LEAD LOCATION: 753860
MDC IDC MSMT BATTERY VOLTAGE: 2.8 V
MDC IDC MSMT LEADCHNL RA PACING THRESHOLD PULSEWIDTH: 0.4 ms
MDC IDC MSMT LEADCHNL RV IMPEDANCE VALUE: 607 Ohm
MDC IDC MSMT LEADCHNL RV PACING THRESHOLD AMPLITUDE: 0.5 V
MDC IDC MSMT LEADCHNL RV SENSING INTR AMPL: 5.6 mV
MDC IDC SESS DTM: 20180117200356
MDC IDC STAT BRADY AP VS PERCENT: 90 %
MDC IDC STAT BRADY AS VP PERCENT: 0 %
MDC IDC STAT BRADY AS VS PERCENT: 10 %

## 2016-08-06 ENCOUNTER — Encounter: Payer: Self-pay | Admitting: Cardiology

## 2016-10-28 ENCOUNTER — Telehealth: Payer: Self-pay | Admitting: Cardiology

## 2016-10-28 ENCOUNTER — Ambulatory Visit (INDEPENDENT_AMBULATORY_CARE_PROVIDER_SITE_OTHER): Payer: Managed Care, Other (non HMO) | Admitting: *Deleted

## 2016-10-28 DIAGNOSIS — I495 Sick sinus syndrome: Secondary | ICD-10-CM | POA: Diagnosis not present

## 2016-10-28 NOTE — Telephone Encounter (Signed)
LMOVM reminding pt to send remote transmission.   

## 2016-10-29 NOTE — Progress Notes (Signed)
Remote pacemaker transmission.   

## 2016-10-30 LAB — CUP PACEART REMOTE DEVICE CHECK
Battery Remaining Longevity: 138 mo
Brady Statistic AP VS Percent: 90 %
Brady Statistic AS VP Percent: 0 %
Date Time Interrogation Session: 20180417192933
Implantable Lead Implant Date: 20150403
Implantable Lead Location: 753860
Implantable Lead Model: 5076
Lead Channel Impedance Value: 607 Ohm
Lead Channel Pacing Threshold Amplitude: 0.5 V
Lead Channel Pacing Threshold Amplitude: 0.75 V
Lead Channel Pacing Threshold Pulse Width: 0.4 ms
Lead Channel Pacing Threshold Pulse Width: 0.4 ms
Lead Channel Setting Pacing Amplitude: 1.5 V
MDC IDC LEAD IMPLANT DT: 20150403
MDC IDC LEAD LOCATION: 753859
MDC IDC MSMT BATTERY IMPEDANCE: 212 Ohm
MDC IDC MSMT BATTERY VOLTAGE: 2.8 V
MDC IDC MSMT LEADCHNL RA IMPEDANCE VALUE: 584 Ohm
MDC IDC PG IMPLANT DT: 20150403
MDC IDC SET LEADCHNL RV PACING AMPLITUDE: 2 V
MDC IDC SET LEADCHNL RV PACING PULSEWIDTH: 0.4 ms
MDC IDC SET LEADCHNL RV SENSING SENSITIVITY: 2 mV
MDC IDC STAT BRADY AP VP PERCENT: 0 %
MDC IDC STAT BRADY AS VS PERCENT: 10 %

## 2016-10-31 ENCOUNTER — Encounter: Payer: Self-pay | Admitting: Cardiology

## 2016-11-13 ENCOUNTER — Other Ambulatory Visit: Payer: Self-pay | Admitting: Specialist

## 2016-11-13 DIAGNOSIS — M5136 Other intervertebral disc degeneration, lumbar region: Secondary | ICD-10-CM

## 2016-12-05 ENCOUNTER — Ambulatory Visit
Admission: RE | Admit: 2016-12-05 | Discharge: 2016-12-05 | Disposition: A | Discharge status: Home / Self Care | Payer: Managed Care, Other (non HMO) | Source: Ambulatory Visit | Attending: Specialist | Admitting: Specialist

## 2016-12-05 DIAGNOSIS — M5136 Other intervertebral disc degeneration, lumbar region: Secondary | ICD-10-CM

## 2016-12-05 MED ORDER — DIAZEPAM 5 MG PO TABS
5.0000 mg | ORAL_TABLET | Freq: Once | ORAL | Status: AC
  Administered 2016-12-05: 5 mg via ORAL

## 2016-12-05 MED ORDER — IOPAMIDOL (ISOVUE-M 200) INJECTION 41%
15.0000 mL | Freq: Once | INTRAMUSCULAR | Status: AC
  Administered 2016-12-05: 15 mL via INTRATHECAL

## 2016-12-05 NOTE — Discharge Instructions (Signed)

## 2016-12-15 ENCOUNTER — Other Ambulatory Visit: Payer: Managed Care, Other (non HMO)

## 2016-12-23 ENCOUNTER — Telehealth: Payer: Self-pay | Admitting: Cardiology

## 2016-12-23 NOTE — Telephone Encounter (Signed)
Patient calling, states that he has been having pain in his right leg and the pain comes after he sits down.   Patient was seen the [facility] office and had several tests ran and was advised to make an appt with Dr. Ellyn Hack. Patient is scheduled to come in on 01-02-17. Thanks.

## 2016-12-23 NOTE — Telephone Encounter (Signed)
Returned call to patient He does not have leg pain when walking He has aches in calf when sitting down - for example when driving He saw Dr. Tonita Cong - a spinal test was ran, he was advised to consult cardiology Patient states he told Dr. Tonita Cong to send test results to Dr. Ellyn Hack Dr. Tonita Cong said he may have calcium built up He has an appt with Dr. Ellyn Hack 6/22 He states he can wait until next Friday for this to be addressed but he wanted to make sure MD had all the info before then  Will routed to MD/RN

## 2016-12-27 NOTE — Telephone Encounter (Signed)
Let's see if we can figure out from Dr. Shelda Pal what he means. If he is concerned about possible peripheral artery disease, less describing get lower extremity Dopplers ordered prior to me seeing him in follow-up.  Sx would be R Leg claudication.    However, reviewing the CT scan results, I do not see any mention of possible PAD.  Erik Lane

## 2016-12-30 ENCOUNTER — Telehealth: Payer: Self-pay | Admitting: Cardiology

## 2016-12-30 NOTE — Telephone Encounter (Signed)
error        [facility] Pre-operative Risk Assessment    Erik Lane 12/30/2016, 2:44 PM  _________________________________________________________________   (provider comments below)

## 2017-01-02 ENCOUNTER — Encounter: Payer: Self-pay | Admitting: Cardiology

## 2017-01-02 ENCOUNTER — Ambulatory Visit (INDEPENDENT_AMBULATORY_CARE_PROVIDER_SITE_OTHER): Payer: Managed Care, Other (non HMO) | Admitting: Cardiology

## 2017-01-02 VITALS — BP 139/76 | HR 64 | Ht 73.0 in | Wt 204.0 lb

## 2017-01-02 DIAGNOSIS — R001 Bradycardia, unspecified: Secondary | ICD-10-CM | POA: Diagnosis not present

## 2017-01-02 DIAGNOSIS — I10 Essential (primary) hypertension: Secondary | ICD-10-CM | POA: Diagnosis not present

## 2017-01-02 DIAGNOSIS — M79661 Pain in right lower leg: Secondary | ICD-10-CM

## 2017-01-02 DIAGNOSIS — I4892 Unspecified atrial flutter: Secondary | ICD-10-CM

## 2017-01-02 DIAGNOSIS — E785 Hyperlipidemia, unspecified: Secondary | ICD-10-CM

## 2017-01-02 DIAGNOSIS — R0989 Other specified symptoms and signs involving the circulatory and respiratory systems: Secondary | ICD-10-CM | POA: Diagnosis not present

## 2017-01-02 DIAGNOSIS — I25119 Atherosclerotic heart disease of native coronary artery with unspecified angina pectoris: Secondary | ICD-10-CM

## 2017-01-02 DIAGNOSIS — I739 Peripheral vascular disease, unspecified: Secondary | ICD-10-CM | POA: Diagnosis not present

## 2017-01-02 DIAGNOSIS — Z951 Presence of aortocoronary bypass graft: Secondary | ICD-10-CM

## 2017-01-02 NOTE — Patient Instructions (Signed)
SCHEDULE [address] [place] has requested that you have an ankle brachial index (ABI). During this test an ultrasound and blood pressure cuff are used to evaluate the arteries that supply the arms and legs with blood. Allow thirty minutes for this exam. There are no restrictions or special instructions.  Your physician has requested that you have an abdominal aorta duplex AND ILIAC ARTERIES. During this test, an ultrasound is used to evaluate the aorta. Allow 30 minutes for this exam. Do not eat after midnight the day before and avoid carbonated beverages Your physician has requested that you have a lower  extremity arterial duplex. This test is an ultrasound of the arteries in the legs. It looks at arterial blood flow in the legs. Allow one hour for Lower Arterial scans. There are no restrictions or special instructions    Your physician recommends that you schedule a follow-up appointment in [place].

## 2017-01-02 NOTE — Progress Notes (Signed)
PCP: Erik Kraft, MD  Clinic Note: Chief Complaint  Patient presents with  . Leg Pain    Right calf pain; referred from orthopedics    HPI: Erik Lane is a 66 y.o. male with a PMH below who presents today for Evaluation of right calf pain at the request of Dr. Susa Lane from Erik Lane. Erik Lane was seeing him for back pain with radiculopathy. A CT myelogram was ordered. This did not show any neurocompression lesions. There was isthmic listhesis at L5-S1 and neural foraminal nerve root impingement at L5.  He was noticing right-sided calf pain mostly in the seated position while driving. --> Erik Lane was unable to palpate PTA pulse on the Right.   He has a history of CAD-CABG as well as bradycardia s/p Pacemaker placement Coronary artery disease was documented first in 2005 with multivessel disease-->CABG.  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  He did have a Myoview in Aug 2017 (small, mostly fixed mid anterolateral wall defect - c/w known occluded SVG-OM1) which appeared to be relatively same as his last. Therefore we just monitored.  Erik Lane had written letters on an annual basis to help him get out of having to do the stress test for his DOT physical stress test annually.  He reportedly has PAF, however no recent episodes of been documented. Most recent PPM replacement 10/2013  Erik Lane was last seen on 06/16/2016. He was doing relatively well, no longer noticing much exertional dyspnea. He is noticing some left calf pain at that time.  Recent Hospitalizations: none  Studies Personally Reviewed - (if available, images/films reviewed: From Epic Chart or Care Everywhere)  none  Interval History: Erik Lane presents at the  request of Erik Lane complaining of right-sided pain in his calf. It is their rest, not necessarily exacerbated with exertion. He notes it mostly after riding in the car. There is no swelling there, it is just a cramping aching pain in his upper calf. He also notes that his foot is somewhat cold at night and has to put a sock on this foot.  My previous clinic notes indicated that he had left greater than right calf pain but now is noticing a right greater than left. He does not necessarily indicate that it is worse with walking.  He is otherwise stable from a cardiac standpoint:  No chest pain or shortness of breath with rest or exertion. No PND, orthopnea or edema. No palpitations, lightheadedness, dizziness, weakness or syncope/near syncope. No TIA/amaurosis fugax symptoms. No melena, hematochezia, hematuria, or epstaxis.  ROS: A comprehensive was performed. Review of Systems  Constitutional: Negative for malaise/fatigue.  HENT: Negative for congestion.   Respiratory: Negative for shortness of breath.   Gastrointestinal: Negative for blood in stool, melena, nausea and vomiting.  Genitourinary: Negative for hematuria.  Musculoskeletal: Positive for back pain.  Neurological: Negative for dizziness.  Psychiatric/Behavioral: Negative.   All other systems reviewed and are negative.  I have reviewed and (if needed) personally updated the patient's problem list, medications, allergies, past medical and surgical history, social and family history.   Past Medical History:  Diagnosis Date  . Abnormal nuclear stress test March 2007; March 2016   for DOT physical/CDL license: Inferolateral ischemia - Considered False-Positive,  but cath showed 100% SVG-OM occlusion; b.medium size, moderate intensity partially reversible anterolateral defect c/w prior MI & per-infarct ischemia, normal wall motion& EF - considered c/w anatomy.  . CAD (coronary artery disease), autologous vein bypass graft February 2007    Occluded SVG-OM1 with patent LIMA-LAD & seq SVG-OM2-3  . CAD in native artery 2005   multivessel CAD, ostial 50-60% LAD, mid LAD 75-80% after D1; OM 2 with 50-60% followed by 60% after OM1. RCA small vessel. --> CABG referral  . Dyslipidemia, goal LDL below 70    on statin  . Essential hypertension   . Hernia of abdominal cavity   . Mild mitral regurgitation by prior echocardiogram 04/2013   Echo: EF 55-60%, G1 DD, mild MR  . Paroxysmal atrial fibrillation (HCC)    no recent documentation, although frequent mode switches noted but less than 2 minutes interval.  . S/P CABG x 2 2005   LIMA-LAD, SVG-OM1, seq SVG-OM 2-OM-3  . Status post cardiac pacemaker procedure 1996, 2003; 10-2013   Medtronic Adapta - replaced 10/2013  . Symptomatic sinus bradycardia 1996   status post pacemaker placement, exchanged in 2003 (Medtronic Sigma, in DDDR)    Past Surgical History:  Procedure Laterality Date  . APPENDECTOMY    . CARDIAC CATHETERIZATION  01/17/2004   multivessel disease with osital 50-60% LAD, mid 70-80% after first diagonal, Cfx and OM2 had 50-60% lesion, 60% lesion after OM1 (Erik Lane)  . CARDIAC CATHETERIZATION  09/16/2005   SVG to OM1 occluded, other grafts patent, patent RCA, normal LV systolic function (Dr. Loni Lane. [place])  . CORONARY ARTERY BYPASS GRAFT  01/24/2004   LIMA-LAD, SVG-OM1, SVG-OM2/OM3 - Dr. Prescott Lane  . GENERATOR REMOVAL  10/14/2013   Procedure: GENERATOR REMOVAL;  Surgeon: Erik Klein, MD;  Location: [facility];  Service: Cardiovascular;;  . HERNIA REPAIR    . NM MYOCAR PERF WALL MOTION  08/2005   mod inferolateral ischemia seen at apex and mid ventricular level, EF 62%  . NM MYOCAR PERF WALL MOTION  02/26/2016   LOW RISK. EF 53%. Post CABG septal wall hypokinesis. There is a small sized mild severity defect in the mid anterolateral wall with no ischemia. Likely related to prior infarct (occluded SVG-OM1). Consistent with prior study.  Marland Kitchen [place] ; 2003;  10-14-13   symptomatic bradycardia. generator change 2003 (Medtronic Sigma) DDDR; left sided device explanted 10-14-13 with new pacing system implatned on right side - MDT ADDRL1 by Dr Sallyanne Kuster  . TRANSTHORACIC ECHOCARDIOGRAM  1996; 04/2013   normal LV function, mild LA dilatation;; b) 10/'14 - EF 55-60%, Gr 1 DD, mild LA dilation, Mild MR    Current Meds  Medication Sig  . aspirin 325 MG tablet Take 325 mg by mouth.  . diltiazem (CARDIZEM CD) 180 MG 24 hr capsule Take 180 mg by mouth daily.  . empagliflozin (JARDIANCE) 25 MG TABS tablet Take by mouth.  Marland Kitchen ibuprofen (ADVIL,MOTRIN) 200 MG tablet Take 200 mg by mouth daily as needed.  . meloxicam (MOBIC) 15 MG tablet Take 15 mg by mouth daily.  . metFORMIN (GLUCOPHAGE-XR) 500 MG 24 hr tablet Take 1,000 mg by mouth 2 (two) times daily.  Marland Kitchen NITROSTAT 0.4 MG SL tablet Place 1 tablet under the tongue every 15 (fifteen) minutes as needed.  . prednisoLONE acetate (PRED FORTE) 1 % ophthalmic suspension Place 1 drop into the right eye 2 (two) times daily.  . rosuvastatin (CRESTOR) 10 MG tablet Take by mouth.  No Known Allergies  Social History   Social History  . Marital status: Widowed    Spouse name: N/A  . Number of children: 2  . Years of education: N/A   Occupational History  .  [employer]   Social History Main Topics  . Smoking status: Never Smoker  . Smokeless tobacco: Never Used  . Alcohol use No  . Drug use: No  . Sexual activity: Not Asked   Other Topics Concern  . None   Social History Narrative   He is a father of 2, grandfather 60. He does chew tobacco on daily basis but quit smoking 12 years ago. Never drinks a fall. 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    family history includes Heart attack in his brother and father; Heart disease in his father; Heart failure in his father.  Wt Readings from Last 3 Encounters:  01/02/17 204 lb (92.5  kg)  06/16/16 212 lb (96.2 kg)  02/26/16 207 lb (93.9 kg)    PHYSICAL EXAM BP 139/76   Pulse 64   Ht 6\' 1"  (1.854 m)   Wt 204 lb (92.5 kg)   BMI 26.91 kg/m  General appearance: alert, cooperative, appears stated age, no distress. Well-nourished, well-groomed HEENT: [place]/AT, EOMI, MMM, anicteric sclera Neck: no adenopathy, no carotid bruit and no JVD Lungs: clear to auscultation bilaterally, normal percussion bilaterally and non-labored Heart: regular rate and rhythm, S1 &S2 normal, no murmur, click, rub or gallop; nondisplaced PMI Abdomen: soft, non-tender; bowel sounds normal; no masses,  no organomegaly; no HJR Extremities: extremities normal, atraumatic, no cyanosis, or edema; notable tenderness to touch and palpation of the right calf. No obvious knot noted in the muscle. Swelling noted. Pulses: 2+ and symmetric in radial pulses and I do agree that I had difficulty palpating pedal pulses bilaterally. Skin: mobility and turgor normal, no evidence of bleeding or bruising and no lesions noted  Neurologic: Mental status: Alert & oriented x 3, thought content appropriate; non-focal exam.  Pleasant mood & affect.    Adult ECG Report N/a  Other studies Reviewed: Additional studies/ records that were reviewed today include:  Recent Labs:   No results found for: CHOL, HDL, LDLCALC, LDLDIRECT, TRIG, CHOLHDL  ASSESSMENT / PLAN: Problem List Items Addressed This Visit    Claudication of calf muscles ([place])    Concern for resting calf pain that is being alone now for several months. Now acutely worse according to his orthopedic surgeon. Apparently the CT myelogram showed some peripheral vascular calcification, he does have decreased pedal pulses. Plan: Check aortoiliac Dopplers as well as ABIs with lower arterial Dopplers. -If significant abnormalities are noted, I would referred for vascular cardiology evaluation       Relevant Orders   VAS US AORTA/IVC/ILIACS   VAS [place] LOWER EXTREMITY  ARTERIAL DUPLEX   VAS [place] ABI WITH/WO TBI   Coronary artery disease involving native coronary artery of native heart with angina pectoris ([place]) - Primary (Chronic)    Stable with no active anginal symptoms. Surgical dyspnea seems improved. He is on Cardizem as opposed to beta blocker due to beta blocker related fatigue.  Acceptable with preserved EF. On aspirin and statin       Decreased pedal pulses    I don't recall having felt decreased pulses in the past, and there is some concern for subacute occlusion of the distal vessel. I would very much  like to have lower extremity arterial Dopplers done along with ABIs, however protocol would have that we do ABIs first.      Relevant Orders   VAS US AORTA/IVC/ILIACS   VAS [place] LOWER EXTREMITY ARTERIAL DUPLEX   VAS [place] ABI WITH/WO TBI   Dyslipidemia, goal LDL below 70 (Chronic)   Essential hypertension (Chronic)    Borderline control with diltiazem.      Paroxysmal atrial flutter (HCC) (Chronic)    As far as I can tell, he is not had any recurrent symptoms, however he may have silent A. fib. Will await the results of his peripheral asked her studies and to determine the potentially had a thromboembolic event.      Right calf pain    Right calf pain is concerning for possible resting ischemia, the fact that he has noted that his foot gets cold at night is more concerning for distal peripheral vascular disease. Plan: Vascular studies including iliac and ABIs/Lower Extremity Arterial Dopplers      Relevant Orders   VAS US AORTA/IVC/ILIACS   VAS [place] LOWER EXTREMITY ARTERIAL DUPLEX   VAS [place] ABI WITH/WO TBI   S/P CABG x 4 : LIMA-LAD, SVG-OM1 (known to be occluded), seq SVG-OM 2-OM 3 (Chronic)    Known occlusion of the SVG to OM with resultant ischemia on stress test. Relatively asymptomatic, and not requiring significant therapy. On diltiazem, aspirin and statin.      symptomatic bradycardia -- status post pacemaker (Chronic)      Current  medicines are reviewed at length with the patient today. (+/- concerns) n/a The following changes have been made: n/a - will consider holding statin  Patient Instructions  SCHEDULE [address] [place] has requested that you have an ankle brachial index (ABI). During this test an ultrasound and blood pressure cuff are used to evaluate the arteries that supply the arms and legs with blood. Allow thirty minutes for this exam. There are no restrictions or special instructions.  Your physician has requested that you have an abdominal aorta duplex AND ILIAC ARTERIES. During this test, an ultrasound is used to evaluate the aorta. Allow 30 minutes for this exam. Do not eat after midnight the Lane before and avoid carbonated beverages Your physician has requested that you have a lower  extremity arterial duplex. This test is an ultrasound of the arteries in the legs. It looks at arterial blood flow in the legs. Allow one hour for Lower Arterial scans. There are no restrictions or special instructions    Your physician recommends that you schedule a follow-up appointment in [place].   Studies Ordered:   No orders of the defined types were placed in this encounter.    Glenetta Hew, M.D., M.S. Interventional Cardiologist   Pager # [phone] Phone # [phone] [address] [place], [place] [address]

## 2017-01-04 ENCOUNTER — Encounter: Payer: Self-pay | Admitting: Cardiology

## 2017-01-04 NOTE — Assessment & Plan Note (Signed)
As far as I can tell, he is not had any recurrent symptoms, however he may have silent A. fib. Will await the results of his peripheral asked her studies and to determine the potentially had a thromboembolic event.

## 2017-01-04 NOTE — Assessment & Plan Note (Signed)
I don't recall having felt decreased pulses in the past, and there is some concern for subacute occlusion of the distal vessel. I would very much like to have lower extremity arterial Dopplers done along with ABIs, however protocol would have that we do ABIs first.

## 2017-01-04 NOTE — Assessment & Plan Note (Signed)
Right calf pain is concerning for possible resting ischemia, the fact that he has noted that his foot gets cold at night is more concerning for distal peripheral vascular disease. Plan: Vascular studies including iliac and ABIs/Lower Extremity Arterial Dopplers

## 2017-01-04 NOTE — Assessment & Plan Note (Signed)
Known occlusion of the SVG to OM with resultant ischemia on stress test. Relatively asymptomatic, and not requiring significant therapy. On diltiazem, aspirin and statin.

## 2017-01-04 NOTE — Assessment & Plan Note (Signed)
Stable with no active anginal symptoms. Surgical dyspnea seems improved. He is on Cardizem as opposed to beta blocker due to beta blocker related fatigue.  Acceptable with preserved EF. On aspirin and statin

## 2017-01-04 NOTE — Assessment & Plan Note (Signed)
Concern for resting calf pain that is being alone now for several months. Now acutely worse according to his orthopedic surgeon. Apparently the CT myelogram showed some peripheral vascular calcification, he does have decreased pedal pulses. Plan: Check aortoiliac Dopplers as well as ABIs with lower arterial Dopplers. -If significant abnormalities are noted, I would referred for vascular cardiology evaluation

## 2017-01-04 NOTE — Assessment & Plan Note (Signed)
Borderline control with diltiazem.

## 2017-01-06 DIAGNOSIS — Z947 Corneal transplant status: Secondary | ICD-10-CM | POA: Diagnosis not present

## 2017-01-06 DIAGNOSIS — H35372 Puckering of macula, left eye: Secondary | ICD-10-CM | POA: Diagnosis not present

## 2017-01-06 DIAGNOSIS — H40042 Steroid responder, left eye: Secondary | ICD-10-CM | POA: Diagnosis not present

## 2017-01-06 DIAGNOSIS — Z961 Presence of intraocular lens: Secondary | ICD-10-CM | POA: Diagnosis not present

## 2017-01-27 ENCOUNTER — Telehealth: Payer: Self-pay | Admitting: Cardiology

## 2017-01-27 ENCOUNTER — Ambulatory Visit (INDEPENDENT_AMBULATORY_CARE_PROVIDER_SITE_OTHER): Payer: Managed Care, Other (non HMO) | Admitting: *Deleted

## 2017-01-27 DIAGNOSIS — I495 Sick sinus syndrome: Secondary | ICD-10-CM | POA: Diagnosis not present

## 2017-01-27 NOTE — Telephone Encounter (Signed)
Received records from [facility] for appointment on 03/09/17 with Dr Ellyn Hack.  Records put with Dr Allison Quarry schedule for 03/09/17. lp

## 2017-01-27 NOTE — Telephone Encounter (Signed)
Spoke with pt and reminded pt of remote transmission that is due today. Pt verbalized understanding.   

## 2017-01-28 ENCOUNTER — Encounter: Payer: Self-pay | Admitting: Cardiology

## 2017-01-28 NOTE — Progress Notes (Signed)
Remote pacemaker transmission.   

## 2017-01-29 LAB — CUP PACEART REMOTE DEVICE CHECK
Battery Impedance: 212 Ohm
Battery Remaining Longevity: 137 mo
Battery Voltage: 2.79 V
Brady Statistic AP VS Percent: 89 %
Implantable Lead Location: 753859
Implantable Lead Model: 5076
Implantable Lead Model: 5076
Implantable Pulse Generator Implant Date: 20150403
Lead Channel Pacing Threshold Amplitude: 0.5 V
Lead Channel Pacing Threshold Amplitude: 0.625 V
Lead Channel Pacing Threshold Pulse Width: 0.4 ms
Lead Channel Setting Pacing Amplitude: 2 V
Lead Channel Setting Pacing Pulse Width: 0.4 ms
MDC IDC LEAD IMPLANT DT: 20150403
MDC IDC LEAD IMPLANT DT: 20150403
MDC IDC LEAD LOCATION: 753860
MDC IDC MSMT LEADCHNL RA IMPEDANCE VALUE: 556 Ohm
MDC IDC MSMT LEADCHNL RV IMPEDANCE VALUE: 653 Ohm
MDC IDC MSMT LEADCHNL RV PACING THRESHOLD PULSEWIDTH: 0.4 ms
MDC IDC SESS DTM: 20180717172441
MDC IDC SET LEADCHNL RA PACING AMPLITUDE: 1.5 V
MDC IDC SET LEADCHNL RV SENSING SENSITIVITY: 2 mV
MDC IDC STAT BRADY AP VP PERCENT: 0 %
MDC IDC STAT BRADY AS VP PERCENT: 0 %
MDC IDC STAT BRADY AS VS PERCENT: 10 %

## 2017-02-05 ENCOUNTER — Ambulatory Visit (HOSPITAL_COMMUNITY)
Admission: RE | Admit: 2017-02-05 | Discharge: 2017-02-05 | Disposition: A | Discharge status: Home / Self Care | Payer: Managed Care, Other (non HMO) | Source: Ambulatory Visit | Attending: Cardiology | Admitting: Cardiology

## 2017-02-05 DIAGNOSIS — I10 Essential (primary) hypertension: Secondary | ICD-10-CM | POA: Insufficient documentation

## 2017-02-05 DIAGNOSIS — Z951 Presence of aortocoronary bypass graft: Secondary | ICD-10-CM | POA: Insufficient documentation

## 2017-02-05 DIAGNOSIS — I700 Atherosclerosis of aorta: Secondary | ICD-10-CM | POA: Insufficient documentation

## 2017-02-05 DIAGNOSIS — E785 Hyperlipidemia, unspecified: Secondary | ICD-10-CM | POA: Diagnosis not present

## 2017-02-05 DIAGNOSIS — I2510 Atherosclerotic heart disease of native coronary artery without angina pectoris: Secondary | ICD-10-CM | POA: Insufficient documentation

## 2017-02-05 DIAGNOSIS — M79661 Pain in right lower leg: Secondary | ICD-10-CM | POA: Diagnosis not present

## 2017-02-05 DIAGNOSIS — I708 Atherosclerosis of other arteries: Secondary | ICD-10-CM | POA: Insufficient documentation

## 2017-02-05 DIAGNOSIS — R0989 Other specified symptoms and signs involving the circulatory and respiratory systems: Secondary | ICD-10-CM

## 2017-02-05 DIAGNOSIS — I739 Peripheral vascular disease, unspecified: Secondary | ICD-10-CM | POA: Insufficient documentation

## 2017-02-11 NOTE — Progress Notes (Signed)
Lower Extremity ABI/TBI & Doppler Study 02/06/2017:  * Continuous Wave waveforms suggest run-off disease. --> however, doppler evaluation indicates only mild heterogenous bilateral plaque & no focal stenosis. * By doppler evaluation, ABI's are inaccurate due to calcified vessels, bilaterally.  * Abnormal great toe-brachial indices, bilaterally: RTBI 0.65 & LTBI 0.67 --> doppler evaluation indicates bilateral 3 Vessel runoff.  For future evaluation - ABI/TBI not effective.  Dopplers are accurate & do not show significant disease.  Glenetta Hew, MD  Forward to PCP: Anda Kraft, MD    Glenetta Hew, MD

## 2017-02-18 DIAGNOSIS — Z947 Corneal transplant status: Secondary | ICD-10-CM | POA: Diagnosis not present

## 2017-02-18 DIAGNOSIS — Z961 Presence of intraocular lens: Secondary | ICD-10-CM | POA: Diagnosis not present

## 2017-03-06 ENCOUNTER — Ambulatory Visit (INDEPENDENT_AMBULATORY_CARE_PROVIDER_SITE_OTHER): Payer: Managed Care, Other (non HMO) | Admitting: Cardiovascular Disease

## 2017-03-06 ENCOUNTER — Encounter: Payer: Self-pay | Admitting: Cardiovascular Disease

## 2017-03-06 VITALS — BP 146/81 | HR 61 | Ht 73.0 in | Wt 204.0 lb

## 2017-03-06 DIAGNOSIS — I472 Ventricular tachycardia: Secondary | ICD-10-CM | POA: Diagnosis not present

## 2017-03-06 DIAGNOSIS — I10 Essential (primary) hypertension: Secondary | ICD-10-CM | POA: Diagnosis not present

## 2017-03-06 DIAGNOSIS — Z950 Presence of cardiac pacemaker: Secondary | ICD-10-CM

## 2017-03-06 DIAGNOSIS — I25810 Atherosclerosis of coronary artery bypass graft(s) without angina pectoris: Secondary | ICD-10-CM

## 2017-03-06 DIAGNOSIS — I4729 Other ventricular tachycardia: Secondary | ICD-10-CM

## 2017-03-06 DIAGNOSIS — I495 Sick sinus syndrome: Secondary | ICD-10-CM

## 2017-03-06 DIAGNOSIS — E785 Hyperlipidemia, unspecified: Secondary | ICD-10-CM | POA: Diagnosis not present

## 2017-03-06 DIAGNOSIS — I4892 Unspecified atrial flutter: Secondary | ICD-10-CM | POA: Diagnosis not present

## 2017-03-06 MED ORDER — METOPROLOL TARTRATE 25 MG PO TABS: 25.0000 mg | ORAL_TABLET | Freq: Two times a day (BID) | ORAL | 3 refills | Status: DC

## 2017-03-06 NOTE — Progress Notes (Signed)
Cardiology Office Note    Date:  03/08/2017   ID:  Erik Lane, DOB [birth year], MRN [id number]  PCP:  Anda Kraft, MD  Cardiologist:  Roma Schanz, M.D.; Sanda Klein, MD   Chief Complaint  Patient presents with  . Follow-up    History of Present Illness:  Erik Lane is a 66 y.o. male with an extensive history of coronary artery disease and previous bypass surgery, here for pacemaker check. His device is a dual-chamber Medtronic Adapta implanted April 2015 for sinus node dysfunction.  He last saw Dr. Ellyn Hack about two months ago. He is generally feeling quite well. He underwent corneal transplant in April and was off work for about 6 weeks. 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 He denies exertional angina or dyspnea. He has had a couple of dizzy spells early in the morning at work. He has not experienced full-blown syncope.  One of the episodes of dizziness may coincide with a long episode of nonsustained ventricular tachycardia recorded by his pacemaker. Cord at 6:40 AM on May 31. He believes that Thursday was one of his first pacemaker work after his corneal transplant. Supraventricular tachycardia lasted for about 9 minutes and the cycle length gradually shortened peaking at a ventricular rate of 265 bpm. Only one episode of nonsustained VT has been recorded, last January.  Otherwise pacemaker interrogation shows normal device function. His Medtronic Adapta  dual-chamber device was implanted in 2015 and still is 11.5 years of pacemaker longevity. There is 90% atrial pacing and 0.2% ventricular pacing and there are no meaningful episodes of mode switch.  Past Medical History:  Diagnosis Date  . Abnormal nuclear stress test March 2007; March 2016   for DOT physical/CDL license: Inferolateral ischemia - Considered False-Positive, but cath showed 100% SVG-OM occlusion; b.medium size, moderate intensity partially  reversible anterolateral defect c/w prior MI & per-infarct ischemia, normal wall motion& EF - considered c/w anatomy.  . CAD (coronary artery disease), autologous vein bypass graft February 2007   Occluded SVG-OM1 with patent LIMA-LAD & seq SVG-OM2-3  . CAD in native artery 2005   multivessel CAD, ostial 50-60% LAD, mid LAD 75-80% after D1; OM 2 with 50-60% followed by 60% after OM1. RCA small vessel. --> CABG referral  . Dyslipidemia, goal LDL below 70    on statin  . Essential hypertension   . Hernia of abdominal cavity   . Mild mitral regurgitation by prior echocardiogram 04/2013   Echo: EF 55-60%, G1 DD, mild MR  . Paroxysmal atrial fibrillation (HCC)    no recent documentation, although frequent mode switches noted but less than 2 minutes interval.  . S/P CABG x 2 2005   LIMA-LAD, SVG-OM1, seq SVG-OM 2-OM-3  . Status post cardiac pacemaker procedure 1996, 2003; 10-2013   Medtronic Adapta - replaced 10/2013  . Symptomatic sinus bradycardia 1996   status post pacemaker placement, exchanged in 2003 (Medtronic Sigma, in DDDR)    Past Surgical History:  Procedure Laterality Date  . APPENDECTOMY    . CARDIAC CATHETERIZATION  01/17/2004   multivessel disease with osital 50-60% LAD, mid 70-80% after first diagonal, Cfx and OM2 had 50-60% lesion, 60% lesion after OM1 (Dr. Rockne Menghini)  . CARDIAC CATHETERIZATION  09/16/2005   SVG to OM1 occluded, other grafts patent, patent RCA, normal LV systolic function (Dr. Loni Muse. Little)  . CORONARY ARTERY BYPASS GRAFT  01/24/2004   LIMA-LAD, SVG-OM1, SVG-OM2/OM3 - Dr. Prescott Gum  .  GENERATOR REMOVAL  10/14/2013   Procedure: GENERATOR REMOVAL;  Surgeon: Sanda Klein, MD;  Location: [facility];  Service: Cardiovascular;;  . HERNIA REPAIR    . NM MYOCAR PERF WALL MOTION  08/2005   mod inferolateral ischemia seen at apex and mid ventricular level, EF 62%  . NM MYOCAR PERF WALL MOTION  02/26/2016   LOW RISK. EF 53%. Post CABG septal wall hypokinesis. There is a  small sized mild severity defect in the mid anterolateral wall with no ischemia. Likely related to prior infarct (occluded SVG-OM1). Consistent with prior study.  Marland Kitchen [place] ; 2003; 10-14-13   symptomatic bradycardia. generator change 2003 (Medtronic Sigma) DDDR; left sided device explanted 10-14-13 with new pacing system implatned on right side - MDT ADDRL1 by Dr Sallyanne Kuster  . TRANSTHORACIC ECHOCARDIOGRAM  1996; 04/2013   normal LV function, mild LA dilatation;; b) 10/'14 - EF 55-60%, Gr 1 DD, mild LA dilation, Mild MR    Current Medications: Outpatient Medications Prior to Visit  Medication Sig Dispense Refill  . aspirin 325 MG tablet Take 325 mg by mouth.    . empagliflozin (JARDIANCE) 25 MG TABS tablet Take by mouth.    Marland Kitchen ibuprofen (ADVIL,MOTRIN) 200 MG tablet Take 200 mg by mouth daily as needed.    . meloxicam (MOBIC) 15 MG tablet Take 15 mg by mouth daily.    . metFORMIN (GLUCOPHAGE-XR) 500 MG 24 hr tablet Take 1,000 mg by mouth 2 (two) times daily.    Marland Kitchen NITROSTAT 0.4 MG SL tablet Place 1 tablet under the tongue every 15 (fifteen) minutes as needed.    . prednisoLONE acetate (PRED FORTE) 1 % ophthalmic suspension Place 1 drop into the right eye 2 (two) times daily.    . rosuvastatin (CRESTOR) 10 MG tablet Take by mouth.    . diltiazem (CARDIZEM CD) 180 MG 24 hr capsule Take 180 mg by mouth daily.     No facility-administered medications prior to visit.      Allergies:   Patient has no known allergies.   Social History   Social History  . Marital status: Widowed    Spouse name: N/A  . Number of children: 2  . Years of education: N/A   Occupational History  .  [employer]   Social History Main Topics  . Smoking status: Never Smoker  . Smokeless tobacco: Never Used  . Alcohol use No  . Drug use: No  . Sexual activity: Not Asked   Other Topics Concern  . None   Social History Narrative   He is a father of 2, grandfather 28. He does chew tobacco on  daily basis but quit smoking 12 years ago. Never drinks a fall. 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     Family History:  The patient's family history includes Heart attack in his brother and father; Heart disease in his father; Heart failure in his father.   ROS:   Please see the history of present illness.    ROS All other systems reviewed and are negative.   PHYSICAL EXAM:   VS:  BP (!) 146/81 (BP Location: Right Arm, Patient Position: Sitting, Cuff Size: Normal)   Pulse 61   Ht 6\' 1"  (1.854 m)   Wt 204 lb (92.5 kg)   BMI 26.91 kg/m     General: Alert, oriented x3, no distress. Minimally overweight, looks younger than  stated age. Head: no evidence of trauma, PERRL, EOMI, no exophtalmos or lid lag, no myxedema, no xanthelasma; normal ears, nose and oropharynx Neck: normal jugular venous pulsations and no hepatojugular reflux; brisk carotid pulses without delay and no carotid bruits Chest: clear to auscultation, no signs of consolidation by percussion or palpation, normal fremitus, symmetrical and full respiratory excursions Cardiovascular: normal position and quality of the apical impulse, regular rhythm, normal first and second heart sounds, no murmurs, rubs or gallops Abdomen: no tenderness or distention, no masses by palpation, no abnormal pulsatility or arterial bruits, normal bowel sounds, no hepatosplenomegaly Extremities: no clubbing, cyanosis or edema; 2+ radial, ulnar and brachial pulses bilaterally; 2+ right femoral, posterior tibial and dorsalis pedis pulses; 2+ left femoral, posterior tibial and dorsalis pedis pulses; no subclavian or femoral bruits Neurological: grossly nonfocal Psych: euthymic mood, full affect  Wt Readings from Last 3 Encounters:  03/06/17 204 lb (92.5 kg)  01/02/17 204 lb (92.5 kg)  06/16/16 212 lb (96.2 kg)      Studies/Labs Reviewed:   EKG:  EKG is not ordered today.      Additional studies/ records that were reviewed today include:  Notes from June encounter with Dr. Glenetta Hew   ASSESSMENT:    1. NSVT (nonsustained ventricular tachycardia) ([place])   2. SSS (sick sinus syndrome) ([place])   3. Pacemaker   4. Coronary artery disease involving autologous vein coronary bypass graft without angina pectoris   5. Paroxysmal atrial flutter (HCC)   6. Essential hypertension   7. Dyslipidemia, goal LDL below 70      PLAN:  In order of problems listed above:  1. NSVT: The episode on the 31st is concerning since it appears to have been symptomatic was relatively long and very fast. However in almost 3 months that has not happened again. He is not taking a beta blocker, but rather is on diltiazem. I'm not sure why. We'll switch to metoprolol instead 2. SSS: Asymptomatic with appropriate pacemaker function. Heart rate histogram shows adequate heart rate response to physical activity with the current sensor settings 3. PPM: Continue remote download every 3 months and office visits yearly 4. CAD: Currently asymptomatic 5. Reported past atrial flutter, none seen in a long time on device check. He is not on anticoagulation therapy, but takes high-dose aspirin. Probably reasonable to drop to the 81 mg dose. 6. HTN: Borderline high today, was lower when he saw Dr. Ellyn Hack 2 months ago. Reevaluate his blood pressure after he has been on metoprolol for about 2 weeks. 7. HLP: Switched to rosuvastatin since I last saw him. No recent labs     Medication Adjustments/Labs and Tests Ordered: Current medicines are reviewed at length with the patient today.  Concerns regarding medicines are outlined above.  Medication changes, Labs and Tests ordered today are listed in the Patient Instructions below. Patient Instructions  Dr Sallyanne Kuster has recommended making the following medication changes: 1. STOP Diltiazem 2. START Metoprolol Tartrate 25 mg - take 1 tablet by mouth twice  daily  Remote monitoring is used to monitor your Pacemaker or ICD from home. This monitoring reduces the number of office visits required to check your device to one time per year. It allows [place] to keep an eye on the functioning of your device to ensure it is working properly. You are scheduled for a device check from home on Tuesday, October 16th, 2018. You may send your transmission at any time that day. If you have a wireless device,  the transmission will be sent automatically. After your physician reviews your transmission, you will receive a notification with your next transmission date.  Dr Sallyanne Kuster recommends that you schedule a follow-up appointment in 12 months with a pacemaker check. You will receive a reminder letter in the mail two months in advance. If you don't receive a letter, please call our office to schedule the follow-up appointment.  If you need a refill on your cardiac medications before your next appointment, please call your pharmacy.    Signed, Sanda Klein, MD  03/08/2017 1:39 PM    [facility] [address], [place], [place]  [address] Phone: [phone]; Fax: [phone]

## 2017-03-06 NOTE — Patient Instructions (Signed)
Dr Sallyanne Kuster has recommended making the following medication changes: 1. STOP Diltiazem 2. START Metoprolol Tartrate 25 mg - take 1 tablet by mouth twice daily  Remote monitoring is used to monitor your Pacemaker or ICD from home. This monitoring reduces the number of office visits required to check your device to one time per year. It allows [place] to keep an eye on the functioning of your device to ensure it is working properly. You are scheduled for a device check from home on Tuesday, October 16th, 2018. You may send your transmission at any time that day. If you have a wireless device, the transmission will be sent automatically. After your physician reviews your transmission, you will receive a notification with your next transmission date.  Dr Sallyanne Kuster recommends that you schedule a follow-up appointment in 12 months with a pacemaker check. You will receive a reminder letter in the mail two months in advance. If you don't receive a letter, please call our office to schedule the follow-up appointment.  If you need a refill on your cardiac medications before your next appointment, please call your pharmacy.

## 2017-03-08 DIAGNOSIS — I4729 Other ventricular tachycardia: Secondary | ICD-10-CM | POA: Insufficient documentation

## 2017-03-08 DIAGNOSIS — I472 Ventricular tachycardia: Secondary | ICD-10-CM | POA: Insufficient documentation

## 2017-03-09 ENCOUNTER — Encounter: Payer: Self-pay | Admitting: Cardiology

## 2017-03-09 ENCOUNTER — Ambulatory Visit (INDEPENDENT_AMBULATORY_CARE_PROVIDER_SITE_OTHER): Payer: Managed Care, Other (non HMO) | Admitting: Cardiology

## 2017-03-09 VITALS — BP 135/78 | HR 61 | Ht 73.0 in | Wt 202.0 lb

## 2017-03-09 DIAGNOSIS — E785 Hyperlipidemia, unspecified: Secondary | ICD-10-CM

## 2017-03-09 DIAGNOSIS — I10 Essential (primary) hypertension: Secondary | ICD-10-CM

## 2017-03-09 DIAGNOSIS — I4892 Unspecified atrial flutter: Secondary | ICD-10-CM

## 2017-03-09 DIAGNOSIS — I472 Ventricular tachycardia: Secondary | ICD-10-CM

## 2017-03-09 DIAGNOSIS — M79661 Pain in right lower leg: Secondary | ICD-10-CM

## 2017-03-09 DIAGNOSIS — I4729 Other ventricular tachycardia: Secondary | ICD-10-CM

## 2017-03-09 DIAGNOSIS — I25119 Atherosclerotic heart disease of native coronary artery with unspecified angina pectoris: Secondary | ICD-10-CM

## 2017-03-09 NOTE — Progress Notes (Signed)
PCP: Anda Kraft, MD  Clinic Note: Chief Complaint  Patient presents with  . Follow-up    test results - arterial Dopplers to evaluate right calf pain  Coronary artery disease was documented first in 2005 with multivessel disease-->CABG.   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   He did have a Myoview in Aug 2017 (small, mostly fixed mid anterolateral wall defect - c/w known occluded SVG-OM1) which appeared to be relatively same as his last. Therefore we just monitored.  Dr. 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  He reportedly has PAF, however no recent episodes of been documented. Most recent PPM replacement 10/2013  HPI: Erik Lane is a 66 y.o. male with a PMH below who presents today for Follow-up after lower extremity Dopplers were ordered for right calf pain.Marland Kitchen He was referred by Dr. Susa Day from [place] for back pain with radiculopathy. There was concern for peripheral vascular calcification and within having right calf pain there is concern for this being potentially resting ischemic PAD.  Erik Lane was last seen on June 22 after seen Dr. Tonita Cong - he noted that the right calf hurts the most when getting back in his regular car after holding his work. Does not hurt while working or walking.  Recent Hospitalizations: None  Studies Personally Reviewed - (if available, images/films reviewed: From Epic Chart or Care Everywhere)  Abdominal iliac Doppler 02/05/2017: Technically challenging. Normal caliber abdominal aorta with aortoiliac atherosclerosis but no stenosis.  Lower extremity arterial duplex 02/05/2017 heterogeneous  plaque in both arteries. No significant stenosis. Three-vessel runoff bilaterally. --> Basically continuous wave waveform suggests runoff disease but Doppler evaluation indicates only mild plaque. Therefore inaccurate due to calcified vessels bilaterally. Both toe brachial indices are reduced, stable indicating bilateral three-vessel runoff.  Interval History: Erik Lane returns back today overall fine from a cardiac standpoint. He still has significant right calf pain that is actually hurting in their rest. No real swelling associated. Certain movements can make it bad. Symptoms not made worse with exertion.  He is otherwise stable from a cardiac standpoint with no active symptoms. No chest pain or shortness of breath with rest or exertion. No PND, orthopnea or edema. No palpitations, lightheadedness, dizziness (although he may have some early morning dizzy spells, but nothing significant), weakness or syncope/near syncope. No TIA/amaurosis fugax symptoms. No melena, hematochezia, hematuria, or epstaxis. No claudication.  He was seen in the interim by Dr. Sallyanne Kuster for pacemaker management and was noted to have a run of nonsustained V. tach. Dr. Sallyanne Kuster switch him from diltiazem to metoprolol. The patient himself has not indicated any symptoms to suggest syncope or near syncope associated with an arrhythmia. No heart failure symptoms.  ROS: A comprehensive was not performed. Mostly pertinent symptoms are noted above. Review of Systems  Constitutional: Negative for malaise/fatigue.  Musculoskeletal: Positive for back pain.   I have reviewed and (if needed) personally updated the patient's problem list, medications, allergies, past medical and surgical history, social and family history.   Past Medical History:  Diagnosis Date  . 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  . CAD (coronary artery disease), autologous vein bypass graft February 2007   Occluded SVG-OM1 with patent LIMA-LAD & seq SVG-OM2-3  . CAD in native artery 2005   multivessel CAD, ostial 50-60% LAD, mid LAD 75-80% after D1; OM 2 with 50-60% followed by 60% after OM1. RCA small vessel. --> CABG referral  . Dyslipidemia, goal LDL below 70    on statin  . Essential hypertension   . Hernia of abdominal cavity   . Mild mitral regurgitation by prior echocardiogram 04/2013   Echo: EF 55-60%, G1 DD, mild MR  . Paroxysmal atrial fibrillation (HCC)    no recent documentation, although frequent mode switches noted but less than 2 minutes interval.  . S/P CABG x 2 2005   LIMA-LAD, SVG-OM1, seq SVG-OM 2-OM-3  . Status post cardiac pacemaker procedure 1996, 2003; 10-2013   Medtronic Adapta - replaced 10/2013  . Symptomatic sinus bradycardia 1996   status post pacemaker placement, exchanged in 2003 (Medtronic Sigma, in DDDR)    Past Surgical History:  Procedure Laterality Date  . APPENDECTOMY    . CARDIAC CATHETERIZATION  01/17/2004   multivessel disease with osital 50-60% LAD, mid 70-80% after first diagonal, Cfx and OM2 had 50-60% lesion, 60% lesion after OM1 (Dr. Rockne Menghini)  . CARDIAC CATHETERIZATION  09/16/2005   SVG to OM1 occluded, other grafts patent, patent RCA, normal LV systolic function (Dr. Loni Muse. Little)  . CORONARY ARTERY BYPASS GRAFT  01/24/2004   LIMA-LAD, SVG-OM1, SVG-OM2/OM3 - Dr. Prescott Gum  . GENERATOR REMOVAL  10/14/2013   Procedure: GENERATOR REMOVAL;  Surgeon: Sanda Klein, MD;  Location: [facility];  Service: Cardiovascular;;  . HERNIA REPAIR    . NM MYOCAR PERF WALL MOTION  08/2005   mod inferolateral ischemia seen at apex and mid ventricular level, EF 62%  . NM MYOCAR PERF WALL MOTION  02/26/2016   LOW RISK. EF  53%. Post CABG septal wall hypokinesis. There is a small sized mild severity defect in the mid anterolateral wall with no ischemia. Likely related to prior infarct (occluded SVG-OM1). Consistent with prior study.  Marland Kitchen [place] ; 2003; 10-14-13   symptomatic bradycardia. generator change 2003 (Medtronic Sigma) DDDR; left sided device explanted 10-14-13 with new pacing system implatned on right side - MDT ADDRL1 by Dr Sallyanne Kuster  . TRANSTHORACIC ECHOCARDIOGRAM  1996; 04/2013   normal LV function, mild LA dilatation;; b) 10/'14 - EF 55-60%, Gr 1 DD, mild LA dilation, Mild MR    Current Meds  Medication Sig  . aspirin 325 MG tablet Take 325 mg by mouth.  . empagliflozin (JARDIANCE) 25 MG TABS tablet Take by mouth.  Marland Kitchen ibuprofen (ADVIL,MOTRIN) 200 MG tablet Take 200 mg by mouth daily as needed.  . meloxicam (MOBIC) 15 MG tablet Take 15 mg by mouth daily.  . metFORMIN (GLUCOPHAGE-XR) 500 MG 24 hr tablet Take 1,000 mg by mouth 2 (two) times daily.  . metoprolol tartrate (LOPRESSOR) 25 MG tablet Take 1 tablet (25 mg total) by mouth 2 (two) times daily.  Marland Kitchen NITROSTAT 0.4 MG SL tablet Place 1 tablet under the tongue every 15 (fifteen) minutes as needed.  . prednisoLONE acetate (PRED FORTE) 1 % ophthalmic suspension Place 1 drop into the right eye 2 (two) times daily.  . rosuvastatin (  CRESTOR) 10 MG tablet Take by mouth.    No Known Allergies  Social History   Social History  . Marital status: Widowed    Spouse name: N/A  . Number of children: 2  . Years of education: N/A   Occupational History  .  [employer]   Social History Main Topics  . Smoking status: Never Smoker  . Smokeless tobacco: Never Used  . Alcohol use No  . Drug use: No  . Sexual activity: Not Asked   Other Topics Concern  . None   Social History Narrative   He is a father of 2, grandfather 7. He does chew tobacco on daily basis but quit smoking 12 years ago. Never drinks a fall. 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    family history includes Heart attack in his brother and father; Heart disease in his father; Heart failure in his father.  Wt Readings from Last 3 Encounters:  03/09/17 202 lb (91.6 kg)  03/06/17 204 lb (92.5 kg)  01/02/17 204 lb (92.5 kg)    PHYSICAL EXAM BP 135/78 (BP Location: Left Arm, Patient Position: Sitting, Cuff Size: Normal)   Pulse 61   Ht 6\' 1"  (1.854 m)   Wt 202 lb (91.6 kg)   BMI 26.65 kg/m  Physical Exam  Constitutional: He is oriented to person, place, and time. He appears well-developed and well-nourished. No distress.  Cardiovascular: Normal rate, regular rhythm and intact distal pulses.  Exam reveals no gallop and no friction rub.   No murmur heard. Pulmonary/Chest: Effort normal and breath sounds normal. No respiratory distress. He has no wheezes. He has no rales.  Musculoskeletal: Normal range of motion.  Still has right calf pain, worse with certain movement. Does not appear to be overall swollen.  Neurological: He is alert and oriented to person, place, and time.  Psychiatric: He has a normal mood and affect. His behavior is normal. Judgment and thought content normal.  Nursing note and vitals reviewed.    Adult ECG Report None  Other studies Reviewed: Additional studies/ records that were reviewed today include:  Recent Labs:  None  No results found for: CHOL, HDL, LDLCALC, LDLDIRECT, TRIG, CHOLHDL   ASSESSMENT / PLAN: Problem List Items Addressed This Visit    Coronary artery disease involving native coronary artery of native heart with angina pectoris (HCC) (Chronic)    No active angina symptoms. I'm little bit concerned that he had some V. tach, but was asymptomatic. No symptoms to suggest angina. Plan: Continue aspirin and now beta blocker along with statin. We've used diltiazem as opposed to beta blocker because of fatigue in the past, as we need to  monitor for these findings.      Dyslipidemia, goal LDL below 70 (Chronic)    Holding statin for now. Hoping that there is no myalgia symptom leading to her calf pain.      Essential hypertension (Chronic)   NSVT (nonsustained ventricular tachycardia) (HCC)    Started on beta blocker by Dr. Sallyanne Kuster. Will follow up and see. If symptoms persist or recur, may need to consider an ischemic evaluation. Monomorphic VT would probably more consistent with his scar that is seen on Myoview. In which case beta blockers best option.      Paroxysmal atrial flutter (HCC) (Chronic)    No documentation of recurrent mode switching. For now continue aspirin without full and correlation.  Right calf pain - Primary    Reilley pain does not seem to be vascular in nature because of his three-vessel runoff. Perhaps it could be musculoskeletal. We will hold his statin temporarily to see if this helps. It does, then we would need to seek out another option. Otherwise after one month if no symptoms changes, he will restart. There is no Obvious swelling to suggest DVT.         Current medicines are reviewed at length with the patient today. (+/- concerns) None The following changes have been made: None  Patient Instructions  Medication instructions  Stop taking crestor ( rosuvastatin) for 1 month .--call office  If symptoms in your calf are better- will make an appointment with our CVRR clinic to discuss other options   No other changes   Your physician recommends that you schedule a follow-up appointment in Dec 2018 with DR Tayson Schnelle.     Studies Ordered:   No orders of the defined types were placed in this encounter.     Glenetta Hew, M.D., M.S. Interventional Cardiologist   Pager # [phone] Phone # [phone] [address] [place], [place] [address]

## 2017-03-09 NOTE — Patient Instructions (Signed)
Medication instructions  Stop taking crestor ( rosuvastatin) for 1 month .--call office  If symptoms in your calf are better- will make an appointment with our CVRR clinic to discuss other options   No other changes   Your physician recommends that you schedule a follow-up appointment in Dec 2018 with DR HARDING.

## 2017-03-11 ENCOUNTER — Encounter: Payer: Self-pay | Admitting: Cardiology

## 2017-03-11 NOTE — Assessment & Plan Note (Signed)
No active angina symptoms. I'm little bit concerned that he had some V. tach, but was asymptomatic. No symptoms to suggest angina. Plan: Continue aspirin and now beta blocker along with statin. We've used diltiazem as opposed to beta blocker because of fatigue in the past, as we need to monitor for these findings.

## 2017-03-11 NOTE — Assessment & Plan Note (Addendum)
Started on beta blocker by Dr. Sallyanne Kuster. Will follow up and see. If symptoms persist or recur, may need to consider an ischemic evaluation. Monomorphic VT would probably more consistent with his scar that is seen on Myoview. In which case beta blockers best option.

## 2017-03-11 NOTE — Assessment & Plan Note (Signed)
No documentation of recurrent mode switching. For now continue aspirin without full and correlation.

## 2017-03-11 NOTE — Assessment & Plan Note (Signed)
Holding statin for now. Hoping that there is no myalgia symptom leading to her calf pain.

## 2017-03-11 NOTE — Assessment & Plan Note (Signed)
Erik Lane pain does not seem to be vascular in nature because of his three-vessel runoff. Perhaps it could be musculoskeletal. We will hold his statin temporarily to see if this helps. It does, then we would need to seek out another option. Otherwise after one month if no symptoms changes, he will restart. There is no Obvious swelling to suggest DVT.

## 2017-04-06 ENCOUNTER — Other Ambulatory Visit: Payer: Self-pay | Admitting: *Deleted

## 2017-04-06 NOTE — Patient Outreach (Signed)
[facility] Screening call #1, unsuccessful but left a message for a return call. If I do not hear back from the member I will try again within the week.  Erik Lane. Myrtie Neither, MSN, [facility] Gerontological Nurse Practitioner [facility] Care Management [phone]

## 2017-04-16 ENCOUNTER — Other Ambulatory Visit: Payer: Self-pay | Admitting: *Deleted

## 2017-04-16 NOTE — Patient Outreach (Signed)
Second attempt [facility] Screening call, unsuccessful but left a message for a return call. If I do not hear back from the member I will try again within the week.  Eulah Pont. Myrtie Neither, MSN, [facility] Gerontological Nurse Practitioner [facility] Care Management [phone]

## 2017-04-17 ENCOUNTER — Encounter: Payer: Self-pay | Admitting: *Deleted

## 2017-04-17 ENCOUNTER — Other Ambulatory Visit: Payer: Self-pay | Admitting: *Deleted

## 2017-04-17 NOTE — Patient Outreach (Signed)
Third attempt for [facility] Screening call, unsuccessful but left a message for a return call, if member wishes to do so. I am sending him an unsuccessful outreach letter.  Eulah Pont. Myrtie Neither, MSN, [facility] Gerontological Nurse Practitioner [facility] Care Management [phone]

## 2017-04-28 ENCOUNTER — Ambulatory Visit (INDEPENDENT_AMBULATORY_CARE_PROVIDER_SITE_OTHER): Payer: Managed Care, Other (non HMO) | Admitting: *Deleted

## 2017-04-28 ENCOUNTER — Telehealth: Payer: Self-pay | Admitting: Cardiology

## 2017-04-28 DIAGNOSIS — I4892 Unspecified atrial flutter: Secondary | ICD-10-CM

## 2017-04-28 NOTE — Telephone Encounter (Signed)
Spoke with pt and reminded pt of remote transmission that is due today. Pt verbalized understanding.   

## 2017-04-29 NOTE — Progress Notes (Signed)
Remote pacemaker transmission.   

## 2017-04-30 LAB — CUP PACEART REMOTE DEVICE CHECK
Battery Impedance: 212 Ohm
Battery Remaining Longevity: 135 mo
Brady Statistic AP VS Percent: 90 %
Brady Statistic AS VP Percent: 0 %
Brady Statistic AS VS Percent: 10 %
Implantable Lead Implant Date: 20150403
Implantable Lead Location: 753860
Implantable Lead Model: 5076
Implantable Lead Model: 5076
Implantable Pulse Generator Implant Date: 20150403
Lead Channel Impedance Value: 653 Ohm
Lead Channel Pacing Threshold Amplitude: 0.625 V
Lead Channel Pacing Threshold Pulse Width: 0.4 ms
Lead Channel Setting Pacing Amplitude: 1.5 V
Lead Channel Setting Pacing Pulse Width: 0.4 ms
Lead Channel Setting Sensing Sensitivity: 2 mV
MDC IDC LEAD IMPLANT DT: 20150403
MDC IDC LEAD LOCATION: 753859
MDC IDC MSMT BATTERY VOLTAGE: 2.79 V
MDC IDC MSMT LEADCHNL RA IMPEDANCE VALUE: 485 Ohm
MDC IDC MSMT LEADCHNL RV PACING THRESHOLD AMPLITUDE: 0.5 V
MDC IDC MSMT LEADCHNL RV PACING THRESHOLD PULSEWIDTH: 0.4 ms
MDC IDC SESS DTM: 20181016222911
MDC IDC SET LEADCHNL RV PACING AMPLITUDE: 2 V
MDC IDC STAT BRADY AP VP PERCENT: 0 %

## 2017-05-01 ENCOUNTER — Encounter: Payer: Self-pay | Admitting: Cardiology

## 2017-06-17 ENCOUNTER — Ambulatory Visit (INDEPENDENT_AMBULATORY_CARE_PROVIDER_SITE_OTHER): Payer: Managed Care, Other (non HMO) | Admitting: Cardiology

## 2017-06-17 ENCOUNTER — Encounter: Payer: Self-pay | Admitting: Cardiology

## 2017-06-17 VITALS — BP 143/82 | HR 62 | Ht 73.0 in | Wt 200.6 lb

## 2017-06-17 DIAGNOSIS — I472 Ventricular tachycardia: Secondary | ICD-10-CM | POA: Diagnosis not present

## 2017-06-17 DIAGNOSIS — I4729 Other ventricular tachycardia: Secondary | ICD-10-CM

## 2017-06-17 DIAGNOSIS — I10 Essential (primary) hypertension: Secondary | ICD-10-CM | POA: Diagnosis not present

## 2017-06-17 DIAGNOSIS — E785 Hyperlipidemia, unspecified: Secondary | ICD-10-CM | POA: Diagnosis not present

## 2017-06-17 DIAGNOSIS — I4892 Unspecified atrial flutter: Secondary | ICD-10-CM | POA: Diagnosis not present

## 2017-06-17 DIAGNOSIS — M79661 Pain in right lower leg: Secondary | ICD-10-CM | POA: Diagnosis not present

## 2017-06-17 DIAGNOSIS — I25119 Atherosclerotic heart disease of native coronary artery with unspecified angina pectoris: Secondary | ICD-10-CM | POA: Diagnosis not present

## 2017-06-17 NOTE — Progress Notes (Signed)
PCP: Anda Kraft, MD  Clinic Note: Chief Complaint  Patient presents with  . Follow-up    pt denied chest pain and SOb, pt c/o pain in right leg  . Coronary Artery Disease    HPI: Erik Lane is a 66 y.o. male with a PMH below who presents today for 57-month follow-up after lower extremity Dopplers were ordered for right calf pain..  Coronary artery disease was documented first in 2005 with multivessel disease-->CABG.   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   He did have a Myoview in Aug 2017 (small, mostly fixed mid anterolateral wall defect - c/w known occluded SVG-OM1) which appeared to be relatively same as his last. Therefore we just monitored.  Dr. 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  He reportedly has PAF, however no recent episodes of been documented. Most recent PPM replacement 10/2013  He was referred by Dr. Dellis Filbert from [place] for back pain with radiculopathy. There was concern for peripheral vascular calcification and within having right calf pain there is concern for this being potentially resting ischemic PAD -- .  Erik Lane was last seen on March 09, 2017 follow-up lower extremity arterial Dopplers were done to evaluate right calf pain.  He was referred by Dr. Susa Day --concerning pain and radiculopathy.  There is concern for possible claudication. I saw him at that time he is doing relatively well for quite standpoint but still is having significant pain.  Recent Hospitalizations: None  Studies Personally Reviewed - (if available, images/films reviewed: From Epic Chart or Care Everywhere)  Interval  History: Erik Lane returns back today overall fine from a cardiac standpoint.  He really still notes this right calf pain.  The only pain that he has.  It is not necessarily with exertion.  It happens at rest as well.  He denies any significant chest tightness pressure at rest or exertion or dyspnea with rest or exertion, his energy level has definitely improved, but he just feels like he is tired of being the day and just cannot keep going to the end of the workday.  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  While doing that he denies any chest pain or STEMI dyspnea.  Occasionally he will feel some unusual heart beating or flip flopping sensation in his chest which is quite likely the pacemaker kicking in.  He has not noted any rapid or dizzy symptoms since being on the beta-blocker.  He does have some mild orthostatic hypotension but no passout spells. No PND, orthopnea or edema.  The right leg hurts still does not have a lot of swelling and no discoloration.  As mentioned before, the symptoms did not seem to be correlating with potential claudication confirmed by Dopplers.  He is due for his DOT physical in January, and he is not sure what level of evaluation he would need.  From a strictly cardiac standpoint, I would not suggest that he would need a stress test done again since he just had one done in August 2017.  His Myoview does have a evidence of prior infarct, and I would fear  that he may have an over read study could lead to have an unnecessary heart catheterization in the absence of symptoms.  The main feature now is to ensure that his blood pressure is controlled.  ROS: A comprehensive was not performed. Mostly pertinent symptoms are noted above. Review of Systems  Constitutional: Negative for malaise/fatigue.  HENT: Negative for nosebleeds.   Gastrointestinal: Negative for melena.  Genitourinary: Negative for hematuria.  Musculoskeletal: Positive for back pain and  myalgias (R calf pain).  All other systems reviewed and are negative.  I have reviewed and (if needed) personally updated the patient's problem list, medications, allergies, past medical and surgical history, social and family history.   Past Medical History:  Diagnosis Date  . Abnormal nuclear stress test March 2007; March 2016   for DOT physical/CDL license: Inferolateral ischemia - Considered False-Positive, but cath showed 100% SVG-OM occlusion; b.medium size, moderate intensity partially reversible anterolateral defect c/w prior MI & per-infarct ischemia, normal wall motion& EF - considered c/w anatomy.  . CAD (coronary artery disease), autologous vein bypass graft February 2007   Occluded SVG-OM1 with patent LIMA-LAD & seq SVG-OM2-3  . CAD in native artery 2005   multivessel CAD, ostial 50-60% LAD, mid LAD 75-80% after D1; OM 2 with 50-60% followed by 60% after OM1. RCA small vessel. --> CABG referral  . Dyslipidemia, goal LDL below 70    on statin  . Essential hypertension   . Hernia of abdominal cavity   . Mild mitral regurgitation by prior echocardiogram 04/2013   Echo: EF 55-60%, G1 DD, mild MR  . Paroxysmal atrial fibrillation (HCC)    no recent documentation, although frequent mode switches noted but less than 2 minutes interval.  . S/P CABG x 2 2005   LIMA-LAD, SVG-OM1, seq SVG-OM 2-OM-3  . Status post cardiac pacemaker procedure 1996, 2003; 10-2013   Medtronic Adapta - replaced 10/2013  . Symptomatic sinus bradycardia 1996   status post pacemaker placement, exchanged in 2003 (Medtronic Sigma, in DDDR)    Past Surgical History:  Procedure Laterality Date  . APPENDECTOMY    . CARDIAC CATHETERIZATION  01/17/2004   multivessel disease with osital 50-60% LAD, mid 70-80% after first diagonal, Cfx and OM2 had 50-60% lesion, 60% lesion after OM1 (Dr. Rockne Menghini)  . CARDIAC CATHETERIZATION  09/16/2005   SVG to OM1 occluded, other grafts patent, patent RCA, normal LV systolic function  (Dr. Loni Muse. Little)  . CORONARY ARTERY BYPASS GRAFT  01/24/2004   LIMA-LAD, SVG-OM1, SVG-OM2/OM3 - Dr. Prescott Gum  . GENERATOR REMOVAL  10/14/2013   Procedure: GENERATOR REMOVAL;  Surgeon: Sanda Klein, MD;  Location: [place] [facility]ATH [facility];  Service: Cardiovascular;;  . HERNIA REPAIR    . NM MYOCAR PERF WALL MOTION  08/2005   mod inferolateral ischemia seen at apex and mid ventricular level, EF 62%  . NM MYOCAR PERF WALL MOTION  02/26/2016   LOW RISK. EF 53%. Post CABG septal wall hypokinesis. There is a small sized mild severity defect in the mid anterolateral wall with no ischemia. Likely related to prior infarct (occluded SVG-OM1). Consistent with prior study.  Marland Kitchen [place] ; 2003; 10-14-13   symptomatic bradycardia. generator change 2003 (Medtronic Sigma) DDDR; left sided device explanted 10-14-13 with new pacing system implatned on right side - MDT ADDRL1 by Dr Sallyanne Kuster  . TRANSTHORACIC ECHOCARDIOGRAM  1996; 04/2013   normal LV function, mild LA dilatation;; b) 10/'14 - EF 55-60%, Gr 1 DD, mild LA dilation, Mild MR  Abdominal iliac Doppler 02/05/2017: Technically challenging. Normal caliber abdominal aorta with aortoiliac atherosclerosis but no stenosis.  Lower extremity arterial duplex 02/05/2017 heterogeneous plaque in both arteries. No significant stenosis. Three-vessel runoff bilaterally. --> Basically continuous wave waveform suggests runoff disease but Doppler evaluation indicates only mild plaque. Therefore inaccurate due to calcified vessels bilaterally. Both toe brachial indices are reduced, stable indicating bilateral three-vessel runoff. Whenever she is exposed to bedtime  Current Meds  Medication Sig  . aspirin 325 MG tablet Take 325 mg by mouth.  . empagliflozin (JARDIANCE) 25 MG TABS tablet Take by mouth.  Marland Kitchen ibuprofen (ADVIL,MOTRIN) 200 MG tablet Take 200 mg by mouth daily as needed.  . meloxicam (MOBIC) 15 MG tablet Take 15 mg by mouth daily.  . metFORMIN (GLUCOPHAGE-XR)  500 MG 24 hr tablet Take 1,000 mg by mouth 2 (two) times daily.  . metoprolol tartrate (LOPRESSOR) 25 MG tablet Take 1 tablet (25 mg total) by mouth 2 (two) times daily.  Marland Kitchen NITROSTAT 0.4 MG SL tablet Place 1 tablet under the tongue every 15 (fifteen) minutes as needed.  . prednisoLONE acetate (PRED FORTE) 1 % ophthalmic suspension Place 1 drop into the right eye 2 (two) times daily.  . rosuvastatin (CRESTOR) 10 MG tablet Take by mouth.    No Known Allergies  Social History   Socioeconomic History  . Marital status: Widowed    Spouse name: None  . Number of children: 2  . Years of education: None  . Highest education level: None  Social Needs  . Financial resource strain: None  . Food insecurity - worry: None  . Food insecurity - inability: None  . Transportation needs - medical: None  . Transportation needs - non-medical: None  Occupational History    Employer: [employer]  Tobacco Use  . Smoking status: Never Smoker  . Smokeless tobacco: Never Used  Substance and Sexual Activity  . Alcohol use: No  . Drug use: No  . Sexual activity: None  Other Topics Concern  . None  Social History Narrative   He is a father of 2, grandfather 74. He does chew tobacco on daily basis but quit smoking 12 years ago. Never drinks a fall. 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    family history includes Heart attack in his brother and father; Heart disease in his father; Heart failure in his father.  Wt Readings from Last 3 Encounters:  06/17/17 200 lb 9.6 oz (91 kg)  03/09/17 202 lb (91.6 kg)  03/06/17 204 lb (92.5 kg)    PHYSICAL EXAM BP (!) 143/82   Pulse 62   Ht 6\' 1"  (1.854 m)   Wt 200 lb 9.6 oz (91 kg)   BMI 26.47 kg/m  Physical Exam  Constitutional: He is oriented to person, place, and time. He appears well-developed and well-nourished. No distress.  Neck: Normal range of motion. Neck supple. No  hepatojugular reflux and no JVD present. Carotid bruit is not present.  Cardiovascular: Normal rate, regular rhythm and intact distal pulses. Exam reveals no gallop and no friction rub.  No murmur heard. Pulmonary/Chest: Effort normal and breath sounds normal. No respiratory distress. He has no wheezes. He has no rales.  Abdominal: Soft. Bowel sounds are normal. He exhibits no distension. There is no tenderness.  Musculoskeletal: Normal range of motion.  Still has right calf pain, worse with certain movement. Does not appear to be overall  swollen.  Neurological: He is alert and oriented to person, place, and time.  Psychiatric: He has a normal mood and affect. His behavior is normal. Judgment and thought content normal.  Nursing note and vitals reviewed.    Adult ECG Report None  Other studies Reviewed: Additional studies/ records that were reviewed today include:  Recent Labs:  Just checked No results found for: CHOL, HDL, LDLCALC, LDLDIRECT, TRIG, CHOLHDL   ASSESSMENT / PLAN: Problem List Items Addressed This Visit    Coronary artery disease involving native coronary artery of native heart with angina pectoris ([place]) - Primary (Chronic)    No further active anginal symptoms.  No sign of recurrent V. tach either. Plan: Continue aspirin plus current low-dose beta-blocker and statin.  Clinically, next suggested stress test would be 2021.      Relevant Orders   Comprehensive metabolic panel   Dyslipidemia, goal LDL below 70 (Chronic)    He did not notice any change in his symptoms having held his statin. His labs were just checked by his PCP, unfortunately do not have the results.  We will order labs to be done here in order for me to follow his results.      Relevant Orders   Lipid panel   Comprehensive metabolic panel   Essential hypertension (Chronic)   NSVT (nonsustained ventricular tachycardia) (HCC) (Chronic)    Noted to have monomorphic VT on his pacemaker evaluation  earlier this year.  Was converted from calcium channel blocker to beta-blocker.  Most recent evaluation was suggested there is been no further episodes, and Myoview was negative for ischemia.  There was evidence of a significant infarct which would make him susceptible.  Continue beta-blocker for now, but low threshold to consider sotalol.      Possible paroxysmal atrial flutter (HCC) (Chronic)    No documentation of any mode switching.  He is not on anticoagulation.  If there is no further evidence, I think were fine being off of anticoagulant.      Relevant Orders   Comprehensive metabolic panel   Right calf pain    Unilateral pain.  Does not appear to be vascular in nature.  No evidence of DVT.  Most likely the pain is related to some type of musculoskeletal strain, however I have no proof.         Current medicines are reviewed at length with the patient today. (+/- concerns) None The following changes have been made: None  Patient Instructions  No medication changes       Contact office of what is needed for you DOT exam, so we can order testing if needed.   In JAN 2019 PLEASE HAVE LAB WORK NOTHING TO EAT OR DRINK THE MORNING OF THE TEST LIPIDS CMP     Your physician wants you to follow-up in [place].You will receive a reminder letter in the mail two months in advance. If you don't receive a letter, please call our office to schedule the follow-up appointment.   If you need a refill on your cardiac medications before your next appointment, please call your pharmacy.    Studies Ordered:   Orders Placed This Encounter  Procedures  . Lipid panel  . Comprehensive metabolic panel      Glenetta Hew, M.D., M.S. Interventional Cardiologist   Pager # [phone] Phone # [phone] [address] [place], [place] [address]

## 2017-06-17 NOTE — Patient Instructions (Signed)
No medication changes       Contact office of what is needed for you DOT exam, so we can order testing if needed.   In JAN 2019 PLEASE HAVE LAB WORK NOTHING TO EAT OR DRINK THE MORNING OF THE TEST LIPIDS CMP     Your physician wants you to follow-up in [place].You will receive a reminder letter in the mail two months in advance. If you don't receive a letter, please call our office to schedule the follow-up appointment.   If you need a refill on your cardiac medications before your next appointment, please call your pharmacy.

## 2017-06-19 ENCOUNTER — Encounter: Payer: Self-pay | Admitting: Cardiology

## 2017-06-19 NOTE — Assessment & Plan Note (Addendum)
He did not notice any change in his symptoms having held his statin. His labs were just checked by his PCP, unfortunately do not have the results.  We will order labs to be done here in order for me to follow his results.

## 2017-06-19 NOTE — Assessment & Plan Note (Signed)
Noted to have monomorphic VT on his pacemaker evaluation earlier this year.  Was converted from calcium channel blocker to beta-blocker.  Most recent evaluation was suggested there is been no further episodes, and Myoview was negative for ischemia.  There was evidence of a significant infarct which would make him susceptible.  Continue beta-blocker for now, but low threshold to consider sotalol.

## 2017-06-19 NOTE — Assessment & Plan Note (Addendum)
Unilateral pain.  Does not appear to be vascular in nature.  No evidence of DVT.  Most likely the pain is related to some type of musculoskeletal strain, however I have no proof.

## 2017-06-19 NOTE — Assessment & Plan Note (Signed)
No documentation of any mode switching.  He is not on anticoagulation.  If there is no further evidence, I think were fine being off of anticoagulant.

## 2017-06-19 NOTE — Assessment & Plan Note (Signed)
No further active anginal symptoms.  No sign of recurrent V. tach either. Plan: Continue aspirin plus current low-dose beta-blocker and statin.  Clinically, next suggested stress test would be 2021.

## 2017-06-30 LAB — CUP PACEART INCLINIC DEVICE CHECK
Battery Remaining Longevity: 140 mo
Battery Voltage: 2.79 V
Implantable Lead Location: 753860
Implantable Lead Model: 5076
Implantable Lead Model: 5076
Implantable Pulse Generator Implant Date: 20150403
Lead Channel Impedance Value: 515 Ohm
Lead Channel Pacing Threshold Amplitude: 0.5 V
Lead Channel Pacing Threshold Amplitude: 0.625 V
Lead Channel Pacing Threshold Pulse Width: 0.4 ms
Lead Channel Setting Pacing Amplitude: 1.5 V
Lead Channel Setting Pacing Pulse Width: 0.4 ms
Lead Channel Setting Sensing Sensitivity: 2 mV
MDC IDC LEAD IMPLANT DT: 20150403
MDC IDC LEAD IMPLANT DT: 20150403
MDC IDC LEAD LOCATION: 753859
MDC IDC MSMT BATTERY IMPEDANCE: 187 Ohm
MDC IDC MSMT LEADCHNL RA PACING THRESHOLD PULSEWIDTH: 0.4 ms
MDC IDC MSMT LEADCHNL RV IMPEDANCE VALUE: 637 Ohm
MDC IDC SESS DTM: 20180824202235
MDC IDC SET LEADCHNL RV PACING AMPLITUDE: 2 V
MDC IDC STAT BRADY AP VP PERCENT: 0 %
MDC IDC STAT BRADY AP VS PERCENT: 90 %
MDC IDC STAT BRADY AS VP PERCENT: 0 %
MDC IDC STAT BRADY AS VS PERCENT: 10 %

## 2017-07-10 ENCOUNTER — Other Ambulatory Visit: Payer: Self-pay | Admitting: Cardiovascular Disease

## 2017-07-28 ENCOUNTER — Telehealth: Payer: Self-pay | Admitting: Cardiology

## 2017-07-28 ENCOUNTER — Ambulatory Visit (INDEPENDENT_AMBULATORY_CARE_PROVIDER_SITE_OTHER): Payer: PPO | Admitting: *Deleted

## 2017-07-28 DIAGNOSIS — I4892 Unspecified atrial flutter: Secondary | ICD-10-CM | POA: Diagnosis not present

## 2017-07-28 DIAGNOSIS — I495 Sick sinus syndrome: Secondary | ICD-10-CM

## 2017-07-28 DIAGNOSIS — I25119 Atherosclerotic heart disease of native coronary artery with unspecified angina pectoris: Secondary | ICD-10-CM | POA: Diagnosis not present

## 2017-07-28 DIAGNOSIS — E785 Hyperlipidemia, unspecified: Secondary | ICD-10-CM | POA: Diagnosis not present

## 2017-07-28 LAB — COMPREHENSIVE METABOLIC PANEL
ALT: 19 IU/L (ref 0–44)
AST: 16 IU/L (ref 0–40)
Albumin/Globulin Ratio: 1.8 (ref 1.2–2.2)
Albumin: 4.4 g/dL (ref 3.6–4.8)
Alkaline Phosphatase: 64 IU/L (ref 39–117)
BILIRUBIN TOTAL: 0.7 mg/dL (ref 0.0–1.2)
BUN/Creatinine Ratio: 18 (ref 10–24)
BUN: 15 mg/dL (ref 8–27)
CALCIUM: 9 mg/dL (ref 8.6–10.2)
CHLORIDE: 105 mmol/L (ref 96–106)
CO2: 23 mmol/L (ref 20–29)
Creatinine, Ser: 0.82 mg/dL (ref 0.76–1.27)
GFR, EST AFRICAN AMERICAN: 105 mL/min/{1.73_m2} (ref >59)
GFR, EST NON AFRICAN AMERICAN: 91 mL/min/{1.73_m2} (ref >59)
GLUCOSE: 127 mg/dL — AB (ref 65–99)
Globulin, Total: 2.4 g/dL (ref 1.5–4.5)
Potassium: 4.5 mmol/L (ref 3.5–5.2)
Sodium: 142 mmol/L (ref 134–144)
Total Protein: 6.8 g/dL (ref 6.0–8.5)

## 2017-07-28 LAB — LIPID PANEL
CHOL/HDL RATIO: 3.6 ratio (ref 0.0–5.0)
Cholesterol, Total: 154 mg/dL (ref 100–199)
HDL: 43 mg/dL (ref >39)
LDL Calculated: 88 mg/dL (ref 0–99)
Triglycerides: 116 mg/dL (ref 0–149)
VLDL Cholesterol Cal: 23 mg/dL (ref 5–40)

## 2017-07-28 NOTE — Telephone Encounter (Signed)
LMOVM reminding pt to send remote transmission.   

## 2017-07-29 NOTE — Progress Notes (Signed)
Remote pacemaker transmission.   

## 2017-07-30 LAB — CUP PACEART REMOTE DEVICE CHECK
Battery Voltage: 2.8 V
Brady Statistic AP VS Percent: 91 %
Brady Statistic AS VP Percent: 0 %
Implantable Lead Implant Date: 20150403
Implantable Lead Location: 753860
Implantable Lead Model: 5076
Implantable Lead Model: 5076
Lead Channel Impedance Value: 555 Ohm
Lead Channel Pacing Threshold Amplitude: 0.5 V
Lead Channel Pacing Threshold Amplitude: 0.75 V
Lead Channel Pacing Threshold Pulse Width: 0.4 ms
Lead Channel Pacing Threshold Pulse Width: 0.4 ms
Lead Channel Setting Pacing Amplitude: 2 V
Lead Channel Setting Pacing Pulse Width: 0.4 ms
MDC IDC LEAD IMPLANT DT: 20150403
MDC IDC LEAD LOCATION: 753859
MDC IDC MSMT BATTERY IMPEDANCE: 237 Ohm
MDC IDC MSMT BATTERY REMAINING LONGEVITY: 132 mo
MDC IDC MSMT LEADCHNL RV IMPEDANCE VALUE: 651 Ohm
MDC IDC PG IMPLANT DT: 20150403
MDC IDC SESS DTM: 20190116094145
MDC IDC SET LEADCHNL RA PACING AMPLITUDE: 1.5 V
MDC IDC SET LEADCHNL RV SENSING SENSITIVITY: 2.8 mV
MDC IDC STAT BRADY AP VP PERCENT: 0 %
MDC IDC STAT BRADY AS VS PERCENT: 9 %

## 2017-07-31 ENCOUNTER — Encounter: Payer: Self-pay | Admitting: Cardiology

## 2017-08-24 DIAGNOSIS — Z0000 Encounter for general adult medical examination without abnormal findings: Secondary | ICD-10-CM | POA: Diagnosis not present

## 2017-08-24 DIAGNOSIS — E118 Type 2 diabetes mellitus with unspecified complications: Secondary | ICD-10-CM | POA: Diagnosis not present

## 2017-08-24 DIAGNOSIS — E782 Mixed hyperlipidemia: Secondary | ICD-10-CM | POA: Diagnosis not present

## 2017-08-24 DIAGNOSIS — I25118 Atherosclerotic heart disease of native coronary artery with other forms of angina pectoris: Secondary | ICD-10-CM | POA: Diagnosis not present

## 2017-09-02 DIAGNOSIS — E1159 Type 2 diabetes mellitus with other circulatory complications: Secondary | ICD-10-CM | POA: Diagnosis not present

## 2017-09-02 DIAGNOSIS — Z23 Encounter for immunization: Secondary | ICD-10-CM | POA: Diagnosis not present

## 2017-09-02 DIAGNOSIS — E789 Disorder of lipoprotein metabolism, unspecified: Secondary | ICD-10-CM | POA: Diagnosis not present

## 2017-09-02 DIAGNOSIS — I10 Essential (primary) hypertension: Secondary | ICD-10-CM | POA: Diagnosis not present

## 2017-09-02 DIAGNOSIS — E1165 Type 2 diabetes mellitus with hyperglycemia: Secondary | ICD-10-CM | POA: Diagnosis not present

## 2017-09-02 DIAGNOSIS — I25118 Atherosclerotic heart disease of native coronary artery with other forms of angina pectoris: Secondary | ICD-10-CM | POA: Diagnosis not present

## 2017-09-02 DIAGNOSIS — E1121 Type 2 diabetes mellitus with diabetic nephropathy: Secondary | ICD-10-CM | POA: Diagnosis not present

## 2017-09-10 DIAGNOSIS — Z1212 Encounter for screening for malignant neoplasm of rectum: Secondary | ICD-10-CM | POA: Diagnosis not present

## 2017-09-10 DIAGNOSIS — Z1211 Encounter for screening for malignant neoplasm of colon: Secondary | ICD-10-CM | POA: Diagnosis not present

## 2017-09-16 DIAGNOSIS — Z947 Corneal transplant status: Secondary | ICD-10-CM | POA: Diagnosis not present

## 2017-09-16 DIAGNOSIS — Z961 Presence of intraocular lens: Secondary | ICD-10-CM | POA: Diagnosis not present

## 2017-10-27 ENCOUNTER — Telehealth: Payer: Self-pay | Admitting: Cardiology

## 2017-10-27 ENCOUNTER — Ambulatory Visit (INDEPENDENT_AMBULATORY_CARE_PROVIDER_SITE_OTHER): Payer: PPO | Admitting: *Deleted

## 2017-10-27 DIAGNOSIS — I495 Sick sinus syndrome: Secondary | ICD-10-CM | POA: Diagnosis not present

## 2017-10-27 NOTE — Telephone Encounter (Signed)
Spoke with pt and reminded pt of remote transmission that is due today. Pt verbalized understanding.   

## 2017-10-30 ENCOUNTER — Encounter: Payer: Self-pay | Admitting: Cardiology

## 2017-10-30 NOTE — Progress Notes (Signed)
Remote pacemaker transmission.   

## 2017-11-02 LAB — CUP PACEART REMOTE DEVICE CHECK
Battery Impedance: 261 Ohm
Battery Voltage: 2.79 V
Brady Statistic AP VS Percent: 91 %
Implantable Lead Implant Date: 20150403
Implantable Lead Location: 753859
Implantable Lead Model: 5076
Implantable Lead Model: 5076
Lead Channel Impedance Value: 539 Ohm
Lead Channel Impedance Value: 637 Ohm
Lead Channel Pacing Threshold Pulse Width: 0.4 ms
Lead Channel Sensing Intrinsic Amplitude: 5.6 mV
Lead Channel Setting Pacing Amplitude: 2 V
Lead Channel Setting Sensing Sensitivity: 2 mV
MDC IDC LEAD IMPLANT DT: 20150403
MDC IDC LEAD LOCATION: 753860
MDC IDC MSMT BATTERY REMAINING LONGEVITY: 128 mo
MDC IDC MSMT LEADCHNL RA PACING THRESHOLD AMPLITUDE: 0.625 V
MDC IDC MSMT LEADCHNL RA PACING THRESHOLD PULSEWIDTH: 0.4 ms
MDC IDC MSMT LEADCHNL RV PACING THRESHOLD AMPLITUDE: 0.5 V
MDC IDC PG IMPLANT DT: 20150403
MDC IDC SESS DTM: 20190418003628
MDC IDC SET LEADCHNL RA PACING AMPLITUDE: 1.5 V
MDC IDC SET LEADCHNL RV PACING PULSEWIDTH: 0.4 ms
MDC IDC STAT BRADY AP VP PERCENT: 0 %
MDC IDC STAT BRADY AS VP PERCENT: 0 %
MDC IDC STAT BRADY AS VS PERCENT: 8 %

## 2017-12-15 DIAGNOSIS — E1165 Type 2 diabetes mellitus with hyperglycemia: Secondary | ICD-10-CM | POA: Diagnosis not present

## 2017-12-15 DIAGNOSIS — I25118 Atherosclerotic heart disease of native coronary artery with other forms of angina pectoris: Secondary | ICD-10-CM | POA: Diagnosis not present

## 2017-12-15 DIAGNOSIS — E1159 Type 2 diabetes mellitus with other circulatory complications: Secondary | ICD-10-CM | POA: Diagnosis not present

## 2017-12-22 DIAGNOSIS — I25118 Atherosclerotic heart disease of native coronary artery with other forms of angina pectoris: Secondary | ICD-10-CM | POA: Diagnosis not present

## 2017-12-22 DIAGNOSIS — E118 Type 2 diabetes mellitus with unspecified complications: Secondary | ICD-10-CM | POA: Diagnosis not present

## 2017-12-22 DIAGNOSIS — E782 Mixed hyperlipidemia: Secondary | ICD-10-CM | POA: Diagnosis not present

## 2017-12-22 DIAGNOSIS — I10 Essential (primary) hypertension: Secondary | ICD-10-CM | POA: Diagnosis not present

## 2018-01-15 DIAGNOSIS — Z947 Corneal transplant status: Secondary | ICD-10-CM | POA: Diagnosis not present

## 2018-01-15 DIAGNOSIS — H35372 Puckering of macula, left eye: Secondary | ICD-10-CM | POA: Diagnosis not present

## 2018-01-15 DIAGNOSIS — Z961 Presence of intraocular lens: Secondary | ICD-10-CM | POA: Diagnosis not present

## 2018-01-15 DIAGNOSIS — H40042 Steroid responder, left eye: Secondary | ICD-10-CM | POA: Diagnosis not present

## 2018-01-19 ENCOUNTER — Other Ambulatory Visit: Payer: Self-pay | Admitting: Cardiovascular Disease

## 2018-01-26 ENCOUNTER — Ambulatory Visit (INDEPENDENT_AMBULATORY_CARE_PROVIDER_SITE_OTHER): Payer: PPO | Admitting: *Deleted

## 2018-01-26 ENCOUNTER — Telehealth: Payer: Self-pay

## 2018-01-26 DIAGNOSIS — I495 Sick sinus syndrome: Secondary | ICD-10-CM | POA: Diagnosis not present

## 2018-01-26 NOTE — Telephone Encounter (Signed)
Spoke with pt and reminded pt of remote transmission that is due today. Pt verbalized understanding.   

## 2018-01-27 ENCOUNTER — Encounter: Payer: Self-pay | Admitting: Cardiology

## 2018-01-27 NOTE — Progress Notes (Signed)
Remote pacemaker transmission.   

## 2018-01-29 LAB — CUP PACEART REMOTE DEVICE CHECK
Battery Impedance: 261 Ohm
Battery Remaining Longevity: 127 mo
Battery Voltage: 2.79 V
Brady Statistic AP VP Percent: 0 %
Brady Statistic AP VS Percent: 92 %
Brady Statistic AS VP Percent: 0 %
Brady Statistic AS VS Percent: 8 %
Date Time Interrogation Session: 20190716225222
Implantable Lead Implant Date: 20150403
Implantable Lead Implant Date: 20150403
Implantable Lead Location: 753859
Implantable Lead Location: 753860
Implantable Lead Model: 5076
Implantable Lead Model: 5076
Implantable Pulse Generator Implant Date: 20150403
Lead Channel Impedance Value: 492 Ohm
Lead Channel Impedance Value: 618 Ohm
Lead Channel Pacing Threshold Amplitude: 0.5 V
Lead Channel Pacing Threshold Amplitude: 0.5 V
Lead Channel Pacing Threshold Pulse Width: 0.4 ms
Lead Channel Pacing Threshold Pulse Width: 0.4 ms
Lead Channel Setting Pacing Amplitude: 1.5 V
Lead Channel Setting Pacing Amplitude: 2 V
Lead Channel Setting Pacing Pulse Width: 0.4 ms
Lead Channel Setting Sensing Sensitivity: 2 mV

## 2018-04-12 ENCOUNTER — Ambulatory Visit: Payer: PPO | Admitting: Cardiology

## 2018-04-12 ENCOUNTER — Encounter

## 2018-04-12 ENCOUNTER — Encounter: Payer: Self-pay | Admitting: Cardiology

## 2018-04-12 VITALS — BP 126/79 | HR 65 | Ht 73.0 in | Wt 204.4 lb

## 2018-04-12 DIAGNOSIS — I4729 Other ventricular tachycardia: Secondary | ICD-10-CM

## 2018-04-12 DIAGNOSIS — Z021 Encounter for pre-employment examination: Secondary | ICD-10-CM

## 2018-04-12 DIAGNOSIS — E785 Hyperlipidemia, unspecified: Secondary | ICD-10-CM | POA: Diagnosis not present

## 2018-04-12 DIAGNOSIS — I472 Ventricular tachycardia: Secondary | ICD-10-CM | POA: Diagnosis not present

## 2018-04-12 DIAGNOSIS — I4892 Unspecified atrial flutter: Secondary | ICD-10-CM

## 2018-04-12 DIAGNOSIS — I2510 Atherosclerotic heart disease of native coronary artery without angina pectoris: Secondary | ICD-10-CM

## 2018-04-12 DIAGNOSIS — I10 Essential (primary) hypertension: Secondary | ICD-10-CM

## 2018-04-12 DIAGNOSIS — Z950 Presence of cardiac pacemaker: Secondary | ICD-10-CM

## 2018-04-12 DIAGNOSIS — Z951 Presence of aortocoronary bypass graft: Secondary | ICD-10-CM

## 2018-04-12 MED ORDER — ROSUVASTATIN CALCIUM 20 MG PO TABS: 20.0000 mg | ORAL_TABLET | Freq: Every day | ORAL | 3 refills | Status: DC

## 2018-04-12 MED ORDER — ASPIRIN EC 81 MG PO TBEC: 81.0000 mg | DELAYED_RELEASE_TABLET | Freq: Every day | ORAL | 3 refills | Status: AC

## 2018-04-12 NOTE — Assessment & Plan Note (Signed)
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 Plan: Myoview stress test (probably okay to do [place]) in December.

## 2018-04-12 NOTE — Assessment & Plan Note (Signed)
Stable blood pressure on current dose of metoprolol.

## 2018-04-12 NOTE — Patient Instructions (Addendum)
MEDICATION INSTRUCTIONS  INCREASE TO 20 MG  CRESTOR ONE DAILY. DECREASE TO 81 MG ASPIRIN   LABS - DEC 2019 - WILL MAIL LABSLIP CMP  LIPID   SCHEDULE AT [place] [address]-- DEC 2019 Your physician has requested that you have a lexiscan myoview. For further information please visit [email or web address]. Please follow instruction sheet, as given.   Your physician wants you to follow-up in [place].You will receive a reminder letter in the mail two months in advance. If you don't receive a letter, please call our office to schedule the follow-up appointment.   If you need a refill on your cardiac medications before your next appointment, please call your pharmacy.

## 2018-04-12 NOTE — Assessment & Plan Note (Addendum)
Not really having any exertional angina symptoms.  I think he just gets tired near the end of the workday but nothing to suggest his prior anginal symptoms. Currently on aspirin statin and low-dose beta-blocker.  Clinically, next suggested stress test will be 2021.  Since he is due to have a DOT physical in January, we will check a Myoview needed late December early January.  Okay to reduce aspirin dose to 81 mg

## 2018-04-12 NOTE — Assessment & Plan Note (Signed)
He is on rosuvastatin 10 mg daily.  No real myalgias.  His LDL actually went up between January and June.   Plan: Increase Crestor to 20 mg daily.  Recheck lipids in December.  If not at goal, will need to either consider titrating up rosuvastatin or adding Zetia.

## 2018-04-12 NOTE — Assessment & Plan Note (Signed)
History of monomorphic VT on pacemaker.  He was therefore converted from diltiazem to beta-blocker.  No further episodes noted. Negative Myoview.

## 2018-04-12 NOTE — Assessment & Plan Note (Signed)
Known occlusion of the SVG to OM1.  He had a defect noted on prior Myoview.  If stable, would not pursue invasive evaluation.

## 2018-04-12 NOTE — Assessment & Plan Note (Signed)
Functioning well.  Monitored by Dr. Sallyanne Kuster.

## 2018-04-12 NOTE — Assessment & Plan Note (Signed)
Nothing documented on his pacemaker.  Continue to monitor.

## 2018-04-12 NOTE — Progress Notes (Signed)
PCP: Merrilee Seashore, MD  Clinic Note: Chief Complaint  Patient presents with  . Follow-up    CAD  . Shortness of Breath    With overexertion    HPI: Erik Lane is a 67 y.o. male with a PMH below who presents today for 85-month follow-up after lower extremity Dopplers were ordered for right calf pain..  MV CAD- 2005--> CABG   Myoview done in 2007 for DOT physical --> inferolateral ischemia and was considered to be a false positive (but was probably more c/w a fixed & not reversible defect   Catheterization showed an occluded SVG to OM1 a patent SVG OM 2-OM3 and patent LIMA.   Myoview in Aug 2017 (small, mostly fixed mid anterolateral wall defect - c/w known occluded SVG-OM1) which appeared to be relatively same as his last. Therefore we just monitored.   Dr. Rex Kras wrote letters on an annual basis to help him get out of having to do the stress test for his DOT physical stress test annually.  He reportedly has PAF, however no recent episodes of been documented. Most recent PPM replacement 10/2013  He was referred by Dr. Dellis Filbert from [facility] for back pain with radiculopathy. There was concern for peripheral vascular calcification and within having right calf pain there is concern for this being potentially resting ischemic PAD -- .  [name] was last seen on Jun 17, 2017   Recent Hospitalizations: None  Studies Personally Reviewed - (if available, images/films reviewed: From Epic Chart or Care Everywhere)  Lower extremity arterial Dopplers July 2018: Bilateral heart disease, no focal stenosis.  ABIs inaccurate due to calcified vessels.  Abnormal toe brachial indices bilaterally.RTBI 0.65 & LTBI 0.67 --> doppler evaluation indicates bilateral 3 Vessel runoff.  Interval History: Taiven returns back today stating that he is doing fairly well.  He says his calf pain on the right leg has definitely improved.  He still has occasional cramping but not nearly  the amount of pain he had before.  Not really associate with claudication. As far as any cardiac symptoms, he really only gets short of breath if he overdoes it with dancing or with exertion at work near the end of his work shift.  He basically says that he just gets tired near the end of his shift --he just feels "give out". He denies any PND, orthopnea with minimal edema at the end of the day. No resting or exertional anginal symptoms. He does not initially do routine exercise, but he does enjoy going to dances.  Sometimes when he is for starting to dance he may notice some intermittent palpitations associated with it, but nothing that makes him lightheaded or dizzy.  He may occasionally have some positional dizziness with bending over and standing back up again or simply standing straight up after being seated for long period of time.   He is due for his DOT physical again January 2020- was told that ST was overdue.  ROS: A comprehensive was not performed. Mostly pertinent symptoms are noted above. Review of Systems  Constitutional: Negative for malaise/fatigue (Gets tired near the end of her workday).  HENT: Negative for nosebleeds.   Respiratory: Negative for cough and shortness of breath.   Gastrointestinal: Negative for abdominal pain, blood in stool and melena.  Genitourinary: Negative for hematuria.  Musculoskeletal: Positive for back pain and myalgias (R calf pain).  Psychiatric/Behavioral: The patient is not nervous/anxious.   All other systems reviewed and are negative.  I  have reviewed and (if needed) personally updated the patient's problem list, medications, allergies, past medical and surgical history, social and family history.   Past Medical History:  Diagnosis Date  . Abnormal nuclear stress test March 2007; March 2016   for DOT physical/CDL license: Inferolateral ischemia - Considered False-Positive, but cath showed 100% SVG-OM occlusion; b.medium size, moderate intensity  partially reversible anterolateral defect c/w prior MI & per-infarct ischemia, normal wall motion& EF - considered c/w anatomy.  . CAD (coronary artery disease), autologous vein bypass graft February 2007   Occluded SVG-OM1 with patent LIMA-LAD & seq SVG-OM2-3  . CAD in native artery 2005   multivessel CAD, ostial 50-60% LAD, mid LAD 75-80% after D1; OM 2 with 50-60% followed by 60% after OM1. RCA small vessel. --> CABG referral  . Dyslipidemia, goal LDL below 70    on statin  . Essential hypertension   . Hernia of abdominal cavity   . Mild mitral regurgitation by prior echocardiogram 04/2013   Echo: EF 55-60%, G1 DD, mild MR  . Paroxysmal atrial fibrillation (HCC)    no recent documentation, although frequent mode switches noted but less than 2 minutes interval.  . S/P CABG x 2 2005   LIMA-LAD, SVG-OM1, seq SVG-OM 2-OM-3  . Status post cardiac pacemaker procedure 1996, 2003; 10-2013   Medtronic Adapta - replaced 10/2013  . Symptomatic sinus bradycardia 1996   status post pacemaker placement, exchanged in 2003 (Medtronic Sigma, in DDDR)    Past Surgical History:  Procedure Laterality Date  . APPENDECTOMY    . CARDIAC CATHETERIZATION  01/17/2004   multivessel disease with osital 50-60% LAD, mid 70-80% after first diagonal, Cfx and OM2 had 50-60% lesion, 60% lesion after OM1 (Dr. Rockne Menghini)  . CARDIAC CATHETERIZATION  09/16/2005   SVG to OM1 occluded, other grafts patent, patent RCA, normal LV systolic function (Dr. Loni Muse. Little)  . CORONARY ARTERY BYPASS GRAFT  01/24/2004   LIMA-LAD, SVG-OM1, SVG-OM2/OM3 - Dr. Prescott Gum  . GENERATOR REMOVAL  10/14/2013   Procedure: GENERATOR REMOVAL;  Surgeon: Sanda Klein, MD;  Location: [facility];  Service: Cardiovascular;;  . HERNIA REPAIR    . NM MYOCAR PERF WALL MOTION  08/2005   mod inferolateral ischemia seen at apex and mid ventricular level, EF 62%  . NM MYOCAR PERF WALL MOTION  02/26/2016   LOW RISK. EF 53%. Post CABG septal wall hypokinesis.  There is a small sized mild severity defect in the mid anterolateral wall with no ischemia. Likely related to prior infarct (occluded SVG-OM1). Consistent with prior study.  Marland Kitchen [place] ; 2003; 10-14-13   symptomatic bradycardia. generator change 2003 (Medtronic Sigma) DDDR; left sided device explanted 10-14-13 with new pacing system implatned on right side - MDT ADDRL1 by Dr Sallyanne Kuster  . TRANSTHORACIC ECHOCARDIOGRAM  1996; 04/2013   normal LV function, mild LA dilatation;; b) 10/'14 - EF 55-60%, Gr 1 DD, mild LA dilation, Mild MR    Abdominal iliac Doppler 02/05/2017: Technically challenging. Normal caliber abdominal aorta with aortoiliac atherosclerosis but no stenosis.  Lower extremity arterial duplex 02/05/2017 heterogeneous plaque in both arteries. No significant stenosis. Three-vessel runoff bilaterally. --> Basically continuous wave waveform suggests runoff disease but Doppler evaluation indicates only mild plaque. Therefore inaccurate due to calcified vessels bilaterally. Both toe brachial indices are reduced, stable indicating bilateral three-vessel runoff. Whenever she is exposed to bedtime  Current Meds  Medication Sig  . empagliflozin (JARDIANCE) 25 MG TABS tablet Take by mouth.  Marland Kitchen  meloxicam (MOBIC) 15 MG tablet Take 15 mg by mouth daily.  . metFORMIN (GLUCOPHAGE-XR) 500 MG 24 hr tablet Take 1,000 mg by mouth 2 (two) times daily.  . metoprolol tartrate (LOPRESSOR) 25 MG tablet TAKE 1 TABLET BY MOUTH TWICE A DAY  . NITROSTAT 0.4 MG SL tablet Place 1 tablet under the tongue every 15 (fifteen) minutes as needed.  . [DISCONTINUED] aspirin 325 MG tablet Take 325 mg by mouth.  . [DISCONTINUED] ibuprofen (ADVIL,MOTRIN) 200 MG tablet Take 200 mg by mouth daily as needed.  . [DISCONTINUED] prednisoLONE acetate (PRED FORTE) 1 % ophthalmic suspension Place 1 drop into the right eye 2 (two) times daily.  . [DISCONTINUED] rosuvastatin (CRESTOR) 10 MG tablet Take by mouth.    No  Known Allergies  Social History   Socioeconomic History  . Marital status: Widowed    Spouse name: Not on file  . Number of children: 2  . Years of education: Not on file  . Highest education level: Not on file  Occupational History    Employer: [employer]  Social Needs  . Financial resource strain: Not on file  . Food insecurity:    Worry: Not on file    Inability: Not on file  . Transportation needs:    Medical: Not on file    Non-medical: Not on file  Tobacco Use  . Smoking status: Never Smoker  . Smokeless tobacco: Never Used  Substance and Sexual Activity  . Alcohol use: No  . Drug use: No  . Sexual activity: Not on file  Lifestyle  . Physical activity:    Days per week: Not on file    Minutes per session: Not on file  . Stress: Not on file  Relationships  . Social connections:    Talks on phone: Not on file    Gets together: Not on file    Attends religious service: Not on file    Active member of club or organization: Not on file    Attends meetings of clubs or organizations: Not on file    Relationship status: Not on file  Other Topics Concern  . Not on file  Social History Narrative   He is a father of 2, grandfather 8. He does chew tobacco on daily basis but quit smoking 12 years ago. Never drinks a fall. 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    family history includes Heart attack in his brother and father; Heart disease in his father; Heart failure in his father.  Wt Readings from Last 3 Encounters:  04/12/18 204 lb 6.4 oz (92.7 kg)  06/17/17 200 lb 9.6 oz (91 kg)  03/09/17 202 lb (91.6 kg)    PHYSICAL EXAM BP 126/79   Pulse 65   Ht 6\' 1"  (1.854 m)   Wt 204 lb 6.4 oz (92.7 kg)   BMI 26.97 kg/m  Physical Exam  Constitutional: He is oriented to person, place, and time. He appears well-developed and well-nourished. No distress.  HENT:  Head: Normocephalic and  atraumatic.  Neck: Normal range of motion. Neck supple. No hepatojugular reflux and no JVD present. Carotid bruit is not present.  Cardiovascular: Normal rate, regular rhythm and intact distal pulses. Exam reveals no gallop and no friction rub.  No murmur heard. Pulmonary/Chest: Effort normal and breath sounds normal. No respiratory distress. He has no wheezes. He has no rales.  Abdominal: Soft. Bowel sounds are  normal. He exhibits no distension. There is no tenderness.  Musculoskeletal: Normal range of motion. He exhibits no edema.  Still has right calf pain, worse with certain movement. Does not appear to be overall swollen.  Neurological: He is alert and oriented to person, place, and time.  Psychiatric: He has a normal mood and affect. His behavior is normal. Judgment and thought content normal.  Nursing note and vitals reviewed.    Adult ECG Report Atrial paced, V sensed.  65 bpm RBBB.  Associated T wave inversions, cannot exclude ischemia. Stable  Other studies Reviewed: Additional studies/ records that were reviewed today include:  Recent Labs: From [facility] 12/15/2017: TC 163, TG 114, HDL 43, LDL 97.  A1c 7.1.  Hemoglobin 16.  Creatinine 0.93.  Potassium 4.9.  TSH 2.62. Lab Results  Component Value Date   CHOL 154 07/28/2017   HDL 43 07/28/2017   LDLCALC 88 07/28/2017   TRIG 116 07/28/2017   CHOLHDL 3.6 07/28/2017    ASSESSMENT / PLAN: Problem List Items Addressed This Visit    Coronary artery disease involving native coronary artery of native heart without angina pectoris - Primary (Chronic)    Not really having any exertional angina symptoms.  I think he just gets tired near the end of the workday but nothing to suggest his prior anginal symptoms. Currently on aspirin statin and low-dose beta-blocker.  Clinically, next suggested stress test will be 2021.  Since he is due to have a DOT physical in January, we will check a Myoview needed late December early January.  Okay to  reduce aspirin dose to 81 mg      Relevant Medications   rosuvastatin (CRESTOR) 20 MG tablet   aspirin EC 81 MG tablet   Other Relevant Orders   EKG 12-Lead   Lipid panel   Comprehensive metabolic panel   MYOCARDIAL PERFUSION IMAGING   Dyslipidemia, goal LDL below 70 (Chronic)    He is on rosuvastatin 10 mg daily.  No real myalgias.  His LDL actually went up between January and June.   Plan: Increase Crestor to 20 mg daily.  Recheck lipids in December.  If not at goal, will need to either consider titrating up rosuvastatin or adding Zetia.      Relevant Medications   rosuvastatin (CRESTOR) 20 MG tablet   aspirin EC 81 MG tablet   Other Relevant Orders   Lipid panel   Comprehensive metabolic panel   Essential hypertension (Chronic)    Stable blood pressure on current dose of metoprolol.      Relevant Medications   rosuvastatin (CRESTOR) 20 MG tablet   aspirin EC 81 MG tablet   NSVT (nonsustained ventricular tachycardia) (HCC) (Chronic)    History of monomorphic VT on pacemaker.  He was therefore converted from diltiazem to beta-blocker.  No further episodes noted. Negative Myoview.      Relevant Medications   rosuvastatin (CRESTOR) 20 MG tablet   aspirin EC 81 MG tablet   Possible paroxysmal atrial flutter (HCC) (Chronic)    Nothing documented on his pacemaker.  Continue to monitor.      Relevant Medications   rosuvastatin (CRESTOR) 20 MG tablet   aspirin EC 81 MG tablet   Pre-employment examination (Chronic)    DOT physical pending in January. Plan: Myoview stress test (probably okay to do [place]) in December.      Relevant Orders   EKG 12-Lead   MYOCARDIAL PERFUSION IMAGING   S/P CABG x 4 : LIMA-LAD, SVG-OM1 (  known to be occluded), seq SVG-OM 2-OM 3 (Chronic)    Known occlusion of the SVG to OM1.  He had a defect noted on prior Myoview.  If stable, would not pursue invasive evaluation.      Relevant Orders   EKG 12-Lead   MYOCARDIAL PERFUSION IMAGING    Status post cardiac pacemaker procedure (Chronic)    Functioning well.  Monitored by Dr. Sallyanne Kuster.         Current medicines are reviewed at length with the patient today. (+/- concerns) None The following changes have been made: None  Patient Instructions  MEDICATION INSTRUCTIONS  INCREASE TO 20 MG  CRESTOR ONE DAILY. DECREASE TO 81 MG ASPIRIN   LABS - DEC 2019 - [facility] MAIL LABSLIP CMP  LIPID   SCHEDULE AT [place] [address]-- DEC 2019 Your physician has requested that you have a lexiscan myoview. For further information please visit [email or web address]. Please follow instruction sheet, as given.   Your physician wants you to follow-up in [place].You will receive a reminder letter in the mail two months in advance. If you don't receive a letter, please call our office to schedule the follow-up appointment.   If you need a refill on your cardiac medications before your next appointment, please call your pharmacy.  Studies Ordered:   Orders Placed This Encounter  Procedures  . Lipid panel  . Comprehensive metabolic panel  . MYOCARDIAL PERFUSION IMAGING  . EKG 12-Lead      Glenetta Hew, M.D., M.S. Interventional Cardiologist   Pager # [phone] Phone # [phone] [address] [place], [place] [address]

## 2018-04-27 ENCOUNTER — Ambulatory Visit (INDEPENDENT_AMBULATORY_CARE_PROVIDER_SITE_OTHER): Payer: PPO | Admitting: *Deleted

## 2018-04-27 DIAGNOSIS — I495 Sick sinus syndrome: Secondary | ICD-10-CM

## 2018-04-28 NOTE — Progress Notes (Signed)
Remote pacemaker transmission.   

## 2018-05-05 ENCOUNTER — Encounter: Payer: Self-pay | Admitting: Cardiovascular Disease

## 2018-05-05 ENCOUNTER — Ambulatory Visit (INDEPENDENT_AMBULATORY_CARE_PROVIDER_SITE_OTHER): Payer: PPO | Admitting: Cardiovascular Disease

## 2018-05-05 VITALS — BP 121/78 | HR 78 | Ht 73.0 in | Wt 200.6 lb

## 2018-05-05 DIAGNOSIS — I10 Essential (primary) hypertension: Secondary | ICD-10-CM

## 2018-05-05 DIAGNOSIS — R001 Bradycardia, unspecified: Secondary | ICD-10-CM | POA: Diagnosis not present

## 2018-05-05 DIAGNOSIS — Z8679 Personal history of other diseases of the circulatory system: Secondary | ICD-10-CM | POA: Diagnosis not present

## 2018-05-05 DIAGNOSIS — E7800 Pure hypercholesterolemia, unspecified: Secondary | ICD-10-CM

## 2018-05-05 DIAGNOSIS — I2510 Atherosclerotic heart disease of native coronary artery without angina pectoris: Secondary | ICD-10-CM | POA: Diagnosis not present

## 2018-05-05 DIAGNOSIS — I472 Ventricular tachycardia, unspecified: Secondary | ICD-10-CM

## 2018-05-05 DIAGNOSIS — Z950 Presence of cardiac pacemaker: Secondary | ICD-10-CM | POA: Diagnosis not present

## 2018-05-05 DIAGNOSIS — I495 Sick sinus syndrome: Secondary | ICD-10-CM | POA: Diagnosis not present

## 2018-05-05 LAB — CUP PACEART INCLINIC DEVICE CHECK
Battery Remaining Longevity: 126 mo
Battery Voltage: 2.79 V
Brady Statistic AP VS Percent: 92.2 %
Brady Statistic AS VS Percent: 7.4 %
Date Time Interrogation Session: 20191023090635
Implantable Lead Implant Date: 20150403
Implantable Lead Location: 753860
Implantable Lead Model: 5076
Lead Channel Impedance Value: 664 Ohm
Lead Channel Pacing Threshold Amplitude: 0.5 V
Lead Channel Pacing Threshold Pulse Width: 0.4 ms
Lead Channel Sensing Intrinsic Amplitude: 5.6 mV
Lead Channel Setting Pacing Amplitude: 2 V
Lead Channel Setting Pacing Pulse Width: 0.4 ms
Lead Channel Setting Sensing Sensitivity: 2 mV
MDC IDC LEAD IMPLANT DT: 20150403
MDC IDC LEAD LOCATION: 753859
MDC IDC MSMT LEADCHNL RA IMPEDANCE VALUE: 522 Ohm
MDC IDC MSMT LEADCHNL RA SENSING INTR AMPL: 1.4 mV
MDC IDC MSMT LEADCHNL RV PACING THRESHOLD AMPLITUDE: 0.5 V
MDC IDC MSMT LEADCHNL RV PACING THRESHOLD PULSEWIDTH: 0.4 ms
MDC IDC PG IMPLANT DT: 20150403
MDC IDC SET LEADCHNL RA PACING AMPLITUDE: 1.5 V
MDC IDC STAT BRADY AP VP PERCENT: 0.4 %
MDC IDC STAT BRADY AS VP PERCENT: 0.1 % — AB

## 2018-05-05 NOTE — Patient Instructions (Signed)
Medication Instructions:  Dr Sallyanne Kuster recommends that you continue on your current medications as directed. Please refer to the Current Medication list given to you today.  If you need a refill on your cardiac medications before your next appointment, please call your pharmacy.   Testing/Procedures: Remote monitoring is used to monitor your Pacemaker of ICD from home. This monitoring reduces the number of office visits required to check your device to one time per year. It allows [place] to keep an eye on the functioning of your device to ensure it is working properly. You are scheduled for a device check from home on Tuesday, January 14th, 2020. You may send your transmission at any time that day. If you have a wireless device, the transmission will be sent automatically. After your physician reviews your transmission, you will receive a postcard with your next transmission date.  Follow-Up: At [facility], you and your health needs are our priority.  As part of our continuing mission to provide you with exceptional heart care, we have created designated Provider Care Teams.  These Care Teams include your primary Cardiologist (physician) and Advanced Practice Providers (APPs -  Physician Assistants and Nurse Practitioners) who all work together to provide you with the care you need, when you need it. You will need a follow up appointment in 15 months (January 2021).  Please call our office 2 months in advance to schedule this appointment.  You may see Sanda Klein, MD or one of the following Advanced Practice Providers on your designated Care Team: [place], [place] . Fabian Sharp, PA-C

## 2018-05-05 NOTE — Progress Notes (Signed)
Cardiology Office Note    Date:  05/05/2018   ID:  Erik Lane, DOB 28-Jun-1951, MRN [id number]  PCP:  Merrilee Seashore, MD  Cardiologist:  Roma Schanz, M.D.; Sanda Klein, MD   Chief Complaint  Patient presents with  . Follow-up    History of Present Illness:  Erik Lane is a 67 y.o. male with an extensive history of coronary artery disease and previous bypass surgery, here for pacemaker check. His device is a dual-chamber Medtronic Adapta implanted April 2015 for sinus node dysfunction.  He originally received a pacemaker in 1996 (unipolar leads) sinus node dysfunction with intermittent AV block and had a generator change in 2003.  Unipolar lead "noise" began interfering with device function with numerous episodes of mode switch due to artifact.  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  The old pacemaker was deactivated but left in place and he received an entirely new system in the right subclavian area in 2015 with new bipolar leads.  Ever since the new system was implanted we have not recorded any "atrial flutter" or "atrial fibrillation" as reported on his old unipolar leads.  However, his device has shown several clear-cut episodes of nonsustained but lengthy ventricular tachycardia, up to 24 beats, occurring infrequently about once every 6 months.  The most recent one was recorded on June 20, around 6:30 PM which is when he usually returns home from work.  The average ventricular rate was around 200 bpm and there is clear evidence of AV dissociation.  He last saw Dr. Ellyn Hack about three weeks ago. He is generally feeling quite well.  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  He feels tired when he returns home at the end of the day, but does not have true dyspnea or angina.  He has not experienced syncope.  He is scheduled for a nuclear stress test in a few weeks, as part of his DOT periodic reevaluation.  Otherwise pacemaker  interrogation shows normal device function. His Medtronic Adapta  dual-chamber device was implanted in 2015 and still has 10.5 years of pacemaker longevity. There is 93% atrial pacing and 0.5% ventricular pacing.  No episodes of atrial high rates have occurred.  The heart rate histogram distribution appears appropriate for his level of activity.  Past Medical History:  Diagnosis Date  . Abnormal nuclear stress test March 2007; March 2016   for DOT physical/CDL license: Inferolateral ischemia - Considered False-Positive, but cath showed 100% SVG-OM occlusion; b.medium size, moderate intensity partially reversible anterolateral defect c/w prior MI & per-infarct ischemia, normal wall motion& EF - considered c/w anatomy.  . CAD (coronary artery disease), autologous vein bypass graft February 2007   Occluded SVG-OM1 with patent LIMA-LAD & seq SVG-OM2-3  . CAD in native artery 2005   multivessel CAD, ostial 50-60% LAD, mid LAD 75-80% after D1; OM 2 with 50-60% followed by 60% after OM1. RCA small vessel. --> CABG referral  . Dyslipidemia, goal LDL below 70    on statin  . Essential hypertension   . Hernia of abdominal cavity   . Mild mitral regurgitation by prior echocardiogram 04/2013   Echo: EF 55-60%, G1 DD, mild MR  . Paroxysmal atrial fibrillation (HCC)    no recent documentation, although frequent mode switches noted but less than 2 minutes interval.  . S/P CABG x 2 2005   LIMA-LAD, SVG-OM1, seq SVG-OM 2-OM-3  . Status post cardiac pacemaker procedure 1996, 2003; 10-2013   Medtronic  Adapta - replaced 10/2013  . Symptomatic sinus bradycardia 1996   status post pacemaker placement, exchanged in 2003 (Medtronic Sigma, in DDDR)    Past Surgical History:  Procedure Laterality Date  . APPENDECTOMY    . CARDIAC CATHETERIZATION  01/17/2004   multivessel disease with osital 50-60% LAD, mid 70-80% after first diagonal, Cfx and OM2 had 50-60% lesion, 60% lesion after OM1 (Dr. Rockne Menghini)  . CARDIAC  CATHETERIZATION  09/16/2005   SVG to OM1 occluded, other grafts patent, patent RCA, normal LV systolic function (Dr. Loni Muse. Little)  . CORONARY ARTERY BYPASS GRAFT  01/24/2004   LIMA-LAD, SVG-OM1, SVG-OM2/OM3 - Dr. Prescott Gum  . GENERATOR REMOVAL  10/14/2013   Procedure: GENERATOR REMOVAL;  Surgeon: Sanda Klein, MD;  Location: [facility];  Service: Cardiovascular;;  . HERNIA REPAIR    . NM MYOCAR PERF WALL MOTION  08/2005   mod inferolateral ischemia seen at apex and mid ventricular level, EF 62%  . NM MYOCAR PERF WALL MOTION  02/26/2016   LOW RISK. EF 53%. Post CABG septal wall hypokinesis. There is a small sized mild severity defect in the mid anterolateral wall with no ischemia. Likely related to prior infarct (occluded SVG-OM1). Consistent with prior study.  Erik Lane ; 2003; 10-14-13   symptomatic bradycardia. generator change 2003 (Medtronic Sigma) DDDR; left sided device explanted 10-14-13 with new pacing system implatned on right side - MDT ADDRL1 by Dr Sallyanne Kuster  . TRANSTHORACIC ECHOCARDIOGRAM  1996; 04/2013   normal LV function, mild LA dilatation;; b) 10/'14 - EF 55-60%, Gr 1 DD, mild LA dilation, Mild MR    Current Medications: Outpatient Medications Prior to Visit  Medication Sig Dispense Refill  . aspirin EC 81 MG tablet Take 1 tablet (81 mg total) by mouth daily. 90 tablet 3  . empagliflozin (JARDIANCE) 25 MG TABS tablet Take by mouth.    . meloxicam (MOBIC) 15 MG tablet Take 15 mg by mouth daily.    . metFORMIN (GLUCOPHAGE-XR) 500 MG 24 hr tablet Take 1,000 mg by mouth 2 (two) times daily.    . metoprolol tartrate (LOPRESSOR) 25 MG tablet TAKE 1 TABLET BY MOUTH TWICE A DAY 180 tablet 1  . NITROSTAT 0.4 MG SL tablet Place 1 tablet under the tongue every 15 (fifteen) minutes as needed.    . rosuvastatin (CRESTOR) 20 MG tablet Take 1 tablet (20 mg total) by mouth daily. 90 tablet 3   No facility-administered medications prior to visit.      Allergies:   Patient has  no known allergies.   Social History   Socioeconomic History  . Marital status: Widowed    Spouse name: Not on file  . Number of children: 2  . Years of education: Not on file  . Highest education level: Not on file  Occupational History    Employer: [employer]  Social Needs  . Financial resource strain: Not on file  . Food insecurity:    Worry: Not on file    Inability: Not on file  . Transportation needs:    Medical: Not on file    Non-medical: Not on file  Tobacco Use  . Smoking status: Never Smoker  . Smokeless tobacco: Never Used  Substance and Sexual Activity  . Alcohol use: No  . Drug use: No  . Sexual activity: Not on file  Lifestyle  . Physical activity:    Days per week: Not on file    Minutes per session: Not  on file  . Stress: Not on file  Relationships  . Social connections:    Talks on phone: Not on file    Gets together: Not on file    Attends religious service: Not on file    Active member of club or organization: Not on file    Attends meetings of clubs or organizations: Not on file    Relationship status: Not on file  Other Topics Concern  . Not on file  Social History Narrative   He is a father of 2, grandfather 69. He does chew tobacco on daily basis but quit smoking 12 years ago. Never drinks a fall. 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     Family History:  The patient's family history includes Heart attack in his brother and father; Heart disease in his father; Heart failure in his father.   ROS:   Please see the history of present illness.    ROS all other systems are reviewed and are negative   PHYSICAL EXAM:   VS:  BP 121/78   Pulse 78   Ht 6\' 1"  (1.854 m)   Wt 200 lb 9.6 oz (91 kg)   BMI 26.47 kg/m   Wt Readings from Last 3 Encounters:  05/05/18 200 lb 9.6 oz (91 kg)  04/12/18 204 lb 6.4 oz (92.7 kg)  06/17/17 200 lb 9.6 oz (91 kg)     General:  Alert, oriented x3, no distress, appears younger than his stated age and looks quite fit. Head: no evidence of trauma, PERRL, EOMI, no exophtalmos or lid lag, no myxedema, no xanthelasma; normal ears, nose and oropharynx Neck: normal jugular venous pulsations and no hepatojugular reflux; brisk carotid pulses without delay and no carotid bruits Chest: clear to auscultation, no signs of consolidation by percussion or palpation, normal fremitus, symmetrical and full respiratory excursions.  Both the old left sided subclavian pacemaker site and the more recent right-sided pacemaker site appears healthy. Cardiovascular: normal position and quality of the apical impulse, regular rhythm, normal first and second heart sounds, no murmurs, rubs or gallops Abdomen: no tenderness or distention, no masses by palpation, no abnormal pulsatility or arterial bruits, normal bowel sounds, no hepatosplenomegaly Extremities: no clubbing, cyanosis or edema; 2+ radial, ulnar and brachial pulses bilaterally; 2+ right femoral, posterior tibial and dorsalis pedis pulses; 2+ left femoral, posterior tibial and dorsalis pedis pulses; no subclavian or femoral bruits Neurological: grossly nonfocal Psych: Normal mood and affect   Studies/Labs Reviewed:   EKG:  EKG is not ordered today.  Creatinine from September 30 shows atrial paced, ventricular sensed rhythm, right bundle branch block and lateral T wave inversions.  QTC 453 ms  Additional studies/ records that were reviewed today include:  Notes from September encounter with Dr. Glenetta Hew   ASSESSMENT:    1. VT (ventricular tachycardia) ([place])   2. SSS (sick sinus syndrome) ([place])   3. Pacemaker   4. Coronary artery disease involving native coronary artery of native heart without angina pectoris   5. History of atrial flutter   6. Essential hypertension   7. Hypercholesterolemia      PLAN:  In order of problems listed above:  1. NSVT: He continues to have  infrequent but rather lengthy episodes of nonsustained VT, but at least last episode was fairly asymptomatic.  Continue beta-blocker. 2. SSS: Asymptomatic, heart rate histogram appears appropriate 3. PPM: Continue remote download every 3  months and office visits yearly.  We will try to stagger his visits so that he does not see Dr. Ellyn Hack and myself back to back.  Left subclavian pacemaker with unipolar leads is deactivated, left in place; the active pacemaker is the right subclavian one. 4. CAD: Asymptomatic.  Scheduled for nuclear stress test in December. 5. Reported past atrial flutter: This was artifact related to his old unipolar leads. 6. HTN: Well-controlled 7. HLP: On rosuvastatin.  Most recent LDL from January was is not quite at target.   Medication Adjustments/Labs and Tests Ordered: Current medicines are reviewed at length with the patient today.  Concerns regarding medicines are outlined above.  Medication changes, Labs and Tests ordered today are listed in the Patient Instructions below. Patient Instructions  Medication Instructions:  Dr Sallyanne Kuster recommends that you continue on your current medications as directed. Please refer to the Current Medication list given to you today.  If you need a refill on your cardiac medications before your next appointment, please call your pharmacy.   Testing/Procedures: Remote monitoring is used to monitor your Pacemaker of ICD from home. This monitoring reduces the number of office visits required to check your device to one time per year. It allows [place] to keep an eye on the functioning of your device to ensure it is working properly. You are scheduled for a device check from home on Tuesday, January 14th, 2020. You may send your transmission at any time that day. If you have a wireless device, the transmission will be sent automatically. After your physician reviews your transmission, you will receive a postcard with your next transmission  date.  Follow-Up: At [facility], you and your health needs are our priority.  As part of our continuing mission to provide you with exceptional heart care, we have created designated Provider Care Teams.  These Care Teams include your primary Cardiologist (physician) and Advanced Practice Providers (APPs -  Physician Assistants and Nurse Practitioners) who all work together to provide you with the care you need, when you need it. You will need a follow up appointment in 15 months (January 2021).  Please call our office 2 months in advance to schedule this appointment.  You may see Sanda Klein, MD or one of the following Advanced Practice Providers on your designated Care Team: [place], [place] . Fabian Sharp, PA-C    Signed, Sanda Klein, MD  05/05/2018 9:12 AM    [facility] [place], [place], [place]  [address] Phone: [phone]; Fax: [phone]

## 2018-05-18 DIAGNOSIS — E118 Type 2 diabetes mellitus with unspecified complications: Secondary | ICD-10-CM | POA: Diagnosis not present

## 2018-05-18 DIAGNOSIS — E782 Mixed hyperlipidemia: Secondary | ICD-10-CM | POA: Diagnosis not present

## 2018-05-18 DIAGNOSIS — I10 Essential (primary) hypertension: Secondary | ICD-10-CM | POA: Diagnosis not present

## 2018-05-21 LAB — CUP PACEART REMOTE DEVICE CHECK
Battery Impedance: 286 Ohm
Battery Remaining Longevity: 123 mo
Battery Voltage: 2.8 V
Brady Statistic AP VP Percent: 0 %
Brady Statistic AS VP Percent: 0 %
Implantable Lead Implant Date: 20150403
Implantable Lead Location: 753859
Implantable Lead Location: 753860
Implantable Lead Model: 5076
Implantable Lead Model: 5076
Implantable Pulse Generator Implant Date: 20150403
Lead Channel Pacing Threshold Amplitude: 0.5 V
Lead Channel Pacing Threshold Pulse Width: 0.4 ms
Lead Channel Pacing Threshold Pulse Width: 0.4 ms
Lead Channel Setting Pacing Amplitude: 1.5 V
Lead Channel Setting Pacing Amplitude: 2 V
Lead Channel Setting Pacing Pulse Width: 0.4 ms
MDC IDC LEAD IMPLANT DT: 20150403
MDC IDC MSMT LEADCHNL RA IMPEDANCE VALUE: 506 Ohm
MDC IDC MSMT LEADCHNL RV IMPEDANCE VALUE: 616 Ohm
MDC IDC MSMT LEADCHNL RV PACING THRESHOLD AMPLITUDE: 0.5 V
MDC IDC SESS DTM: 20191015082356
MDC IDC SET LEADCHNL RV SENSING SENSITIVITY: 2 mV
MDC IDC STAT BRADY AP VS PERCENT: 92 %
MDC IDC STAT BRADY AS VS PERCENT: 7 %

## 2018-06-15 ENCOUNTER — Telehealth: Payer: Self-pay | Admitting: *Deleted

## 2018-06-15 DIAGNOSIS — I2510 Atherosclerotic heart disease of native coronary artery without angina pectoris: Secondary | ICD-10-CM

## 2018-06-15 DIAGNOSIS — E785 Hyperlipidemia, unspecified: Secondary | ICD-10-CM

## 2018-06-15 NOTE — Telephone Encounter (Signed)
-----   Message from Raiford Simmonds, RN sent at 04/12/2018 10:23 AM EDT ----- NEEDS LABS - LIPID , CMP  DEC 30 ,2019  MAIL @ Jun 11, 2018

## 2018-06-15 NOTE — Telephone Encounter (Signed)
MAIL LETTER AND LABSLIP 

## 2018-06-24 ENCOUNTER — Telehealth (HOSPITAL_COMMUNITY): Payer: Self-pay

## 2018-06-24 NOTE — Telephone Encounter (Signed)
Encounter complete. 

## 2018-06-25 ENCOUNTER — Other Ambulatory Visit: Payer: Self-pay | Admitting: Cardiovascular Disease

## 2018-06-25 ENCOUNTER — Telehealth (HOSPITAL_COMMUNITY): Payer: Self-pay

## 2018-06-25 NOTE — Telephone Encounter (Signed)
Rx request sent to pharmacy.  

## 2018-06-25 NOTE — Telephone Encounter (Signed)
Encounter complete. 

## 2018-06-29 ENCOUNTER — Ambulatory Visit (HOSPITAL_COMMUNITY)
Admission: RE | Admit: 2018-06-29 | Discharge: 2018-06-29 | Disposition: A | Discharge status: Home / Self Care | Payer: PPO | Source: Ambulatory Visit | Attending: Cardiology | Admitting: Cardiology

## 2018-06-29 DIAGNOSIS — I2510 Atherosclerotic heart disease of native coronary artery without angina pectoris: Secondary | ICD-10-CM | POA: Diagnosis not present

## 2018-06-29 DIAGNOSIS — Z021 Encounter for pre-employment examination: Secondary | ICD-10-CM

## 2018-06-29 DIAGNOSIS — Z951 Presence of aortocoronary bypass graft: Secondary | ICD-10-CM | POA: Diagnosis not present

## 2018-06-29 LAB — MYOCARDIAL PERFUSION IMAGING
CHL CUP NUCLEAR SDS: 1
CHL CUP NUCLEAR SRS: 4
CHL CUP NUCLEAR SSS: 5
CSEPPHR: 64 {beats}/min
LV dias vol: 88 mL (ref 62–150)
LV sys vol: 43 mL
Rest HR: 61 {beats}/min
TID: 1.29

## 2018-06-29 MED ORDER — REGADENOSON 0.4 MG/5ML IV SOLN
0.4000 mg | Freq: Once | INTRAVENOUS | Status: AC
  Administered 2018-06-29: 0.4 mg via INTRAVENOUS

## 2018-06-29 MED ORDER — TECHNETIUM TC 99M TETROFOSMIN IV KIT
7.9000 | PACK | Freq: Once | INTRAVENOUS | Status: AC | PRN
  Administered 2018-06-29: 7.9 via INTRAVENOUS
  Filled 2018-06-29: qty 8

## 2018-06-29 MED ORDER — TECHNETIUM TC 99M TETROFOSMIN IV KIT
27.0000 | PACK | Freq: Once | INTRAVENOUS | Status: AC | PRN
Start: 2018-06-29 — End: 2018-06-29
  Administered 2018-06-29: 27 via INTRAVENOUS
  Filled 2018-06-29: qty 27

## 2018-06-30 DIAGNOSIS — I25118 Atherosclerotic heart disease of native coronary artery with other forms of angina pectoris: Secondary | ICD-10-CM | POA: Diagnosis not present

## 2018-06-30 DIAGNOSIS — E782 Mixed hyperlipidemia: Secondary | ICD-10-CM | POA: Diagnosis not present

## 2018-06-30 DIAGNOSIS — E118 Type 2 diabetes mellitus with unspecified complications: Secondary | ICD-10-CM | POA: Diagnosis not present

## 2018-06-30 DIAGNOSIS — E1159 Type 2 diabetes mellitus with other circulatory complications: Secondary | ICD-10-CM | POA: Diagnosis not present

## 2018-06-30 DIAGNOSIS — E1121 Type 2 diabetes mellitus with diabetic nephropathy: Secondary | ICD-10-CM | POA: Diagnosis not present

## 2018-07-27 ENCOUNTER — Ambulatory Visit (INDEPENDENT_AMBULATORY_CARE_PROVIDER_SITE_OTHER): Payer: PPO

## 2018-07-27 DIAGNOSIS — I495 Sick sinus syndrome: Secondary | ICD-10-CM

## 2018-07-28 NOTE — Progress Notes (Signed)
Remote pacemaker transmission.   

## 2018-07-31 LAB — CUP PACEART REMOTE DEVICE CHECK
Battery Remaining Longevity: 119 mo
Battery Voltage: 2.8 V
Brady Statistic AS VS Percent: 2 %
Date Time Interrogation Session: 20200114221201
Implantable Lead Implant Date: 20150403
Implantable Lead Location: 753859
Implantable Lead Location: 753860
Implantable Lead Model: 5076
Implantable Lead Model: 5076
Implantable Pulse Generator Implant Date: 20150403
Lead Channel Pacing Threshold Amplitude: 0.5 V
Lead Channel Pacing Threshold Pulse Width: 0.4 ms
Lead Channel Setting Pacing Amplitude: 1.5 V
Lead Channel Setting Pacing Amplitude: 2 V
Lead Channel Setting Pacing Pulse Width: 0.4 ms
MDC IDC LEAD IMPLANT DT: 20150403
MDC IDC MSMT BATTERY IMPEDANCE: 311 Ohm
MDC IDC MSMT LEADCHNL RA IMPEDANCE VALUE: 485 Ohm
MDC IDC MSMT LEADCHNL RA PACING THRESHOLD PULSEWIDTH: 0.4 ms
MDC IDC MSMT LEADCHNL RV IMPEDANCE VALUE: 649 Ohm
MDC IDC MSMT LEADCHNL RV PACING THRESHOLD AMPLITUDE: 0.5 V
MDC IDC SET LEADCHNL RV SENSING SENSITIVITY: 2.8 mV
MDC IDC STAT BRADY AP VP PERCENT: 0 %
MDC IDC STAT BRADY AP VS PERCENT: 98 %
MDC IDC STAT BRADY AS VP PERCENT: 0 %

## 2018-09-10 DIAGNOSIS — E118 Type 2 diabetes mellitus with unspecified complications: Secondary | ICD-10-CM | POA: Diagnosis not present

## 2018-09-10 DIAGNOSIS — Z125 Encounter for screening for malignant neoplasm of prostate: Secondary | ICD-10-CM | POA: Diagnosis not present

## 2018-09-10 DIAGNOSIS — E782 Mixed hyperlipidemia: Secondary | ICD-10-CM | POA: Diagnosis not present

## 2018-09-10 DIAGNOSIS — R6889 Other general symptoms and signs: Secondary | ICD-10-CM | POA: Diagnosis not present

## 2018-09-10 DIAGNOSIS — Z0000 Encounter for general adult medical examination without abnormal findings: Secondary | ICD-10-CM | POA: Diagnosis not present

## 2018-09-10 DIAGNOSIS — J069 Acute upper respiratory infection, unspecified: Secondary | ICD-10-CM | POA: Diagnosis not present

## 2018-09-10 DIAGNOSIS — E1159 Type 2 diabetes mellitus with other circulatory complications: Secondary | ICD-10-CM | POA: Diagnosis not present

## 2018-09-10 DIAGNOSIS — I25118 Atherosclerotic heart disease of native coronary artery with other forms of angina pectoris: Secondary | ICD-10-CM | POA: Diagnosis not present

## 2018-09-17 DIAGNOSIS — E1121 Type 2 diabetes mellitus with diabetic nephropathy: Secondary | ICD-10-CM | POA: Diagnosis not present

## 2018-09-17 DIAGNOSIS — E1159 Type 2 diabetes mellitus with other circulatory complications: Secondary | ICD-10-CM | POA: Diagnosis not present

## 2018-09-17 DIAGNOSIS — E782 Mixed hyperlipidemia: Secondary | ICD-10-CM | POA: Diagnosis not present

## 2018-09-17 DIAGNOSIS — E1165 Type 2 diabetes mellitus with hyperglycemia: Secondary | ICD-10-CM | POA: Diagnosis not present

## 2018-09-17 DIAGNOSIS — Z0000 Encounter for general adult medical examination without abnormal findings: Secondary | ICD-10-CM | POA: Diagnosis not present

## 2018-09-17 DIAGNOSIS — I4892 Unspecified atrial flutter: Secondary | ICD-10-CM | POA: Diagnosis not present

## 2018-09-17 DIAGNOSIS — E1169 Type 2 diabetes mellitus with other specified complication: Secondary | ICD-10-CM | POA: Diagnosis not present

## 2018-09-17 DIAGNOSIS — I25118 Atherosclerotic heart disease of native coronary artery with other forms of angina pectoris: Secondary | ICD-10-CM | POA: Diagnosis not present

## 2018-09-17 DIAGNOSIS — I10 Essential (primary) hypertension: Secondary | ICD-10-CM | POA: Diagnosis not present

## 2018-09-17 DIAGNOSIS — I25119 Atherosclerotic heart disease of native coronary artery with unspecified angina pectoris: Secondary | ICD-10-CM | POA: Diagnosis not present

## 2018-10-26 ENCOUNTER — Other Ambulatory Visit: Payer: Self-pay

## 2018-10-26 ENCOUNTER — Ambulatory Visit (INDEPENDENT_AMBULATORY_CARE_PROVIDER_SITE_OTHER): Payer: PPO | Admitting: *Deleted

## 2018-10-26 DIAGNOSIS — I495 Sick sinus syndrome: Secondary | ICD-10-CM | POA: Diagnosis not present

## 2018-10-26 LAB — CUP PACEART REMOTE DEVICE CHECK
Battery Impedance: 336 Ohm
Battery Remaining Longevity: 116 mo
Battery Voltage: 2.8 V
Brady Statistic AP VP Percent: 0 %
Brady Statistic AP VS Percent: 97 %
Brady Statistic AS VP Percent: 0 %
Brady Statistic AS VS Percent: 3 %
Date Time Interrogation Session: 20200414194855
Implantable Lead Implant Date: 20150403
Implantable Lead Implant Date: 20150403
Implantable Lead Location: 753859
Implantable Lead Location: 753860
Implantable Lead Model: 5076
Implantable Lead Model: 5076
Implantable Pulse Generator Implant Date: 20150403
Lead Channel Impedance Value: 471 Ohm
Lead Channel Impedance Value: 664 Ohm
Lead Channel Pacing Threshold Amplitude: 0.5 V
Lead Channel Pacing Threshold Amplitude: 0.5 V
Lead Channel Pacing Threshold Pulse Width: 0.4 ms
Lead Channel Pacing Threshold Pulse Width: 0.4 ms
Lead Channel Setting Pacing Amplitude: 1.5 V
Lead Channel Setting Pacing Amplitude: 2 V
Lead Channel Setting Pacing Pulse Width: 0.4 ms
Lead Channel Setting Sensing Sensitivity: 2 mV

## 2018-11-02 ENCOUNTER — Encounter: Payer: Self-pay | Admitting: Cardiology

## 2018-11-02 NOTE — Progress Notes (Signed)
Remote pacemaker transmission.   

## 2018-12-24 ENCOUNTER — Other Ambulatory Visit: Payer: Self-pay | Admitting: Cardiovascular Disease

## 2019-01-25 ENCOUNTER — Ambulatory Visit (INDEPENDENT_AMBULATORY_CARE_PROVIDER_SITE_OTHER): Payer: PPO | Admitting: *Deleted

## 2019-01-25 DIAGNOSIS — I495 Sick sinus syndrome: Secondary | ICD-10-CM

## 2019-01-26 ENCOUNTER — Telehealth: Payer: Self-pay

## 2019-01-26 LAB — CUP PACEART REMOTE DEVICE CHECK
Battery Impedance: 385 Ohm
Battery Remaining Longevity: 111 mo
Battery Voltage: 2.79 V
Brady Statistic AP VP Percent: 0 %
Brady Statistic AP VS Percent: 96 %
Brady Statistic AS VP Percent: 0 %
Brady Statistic AS VS Percent: 4 %
Date Time Interrogation Session: 20200715131611
Implantable Lead Implant Date: 20150403
Implantable Lead Implant Date: 20150403
Implantable Lead Location: 753859
Implantable Lead Location: 753860
Implantable Lead Model: 5076
Implantable Lead Model: 5076
Implantable Pulse Generator Implant Date: 20150403
Lead Channel Impedance Value: 470 Ohm
Lead Channel Impedance Value: 644 Ohm
Lead Channel Pacing Threshold Amplitude: 0.5 V
Lead Channel Pacing Threshold Amplitude: 0.625 V
Lead Channel Pacing Threshold Pulse Width: 0.4 ms
Lead Channel Pacing Threshold Pulse Width: 0.4 ms
Lead Channel Setting Pacing Amplitude: 1.5 V
Lead Channel Setting Pacing Amplitude: 2 V
Lead Channel Setting Pacing Pulse Width: 0.4 ms
Lead Channel Setting Sensing Sensitivity: 2 mV

## 2019-01-26 NOTE — Telephone Encounter (Signed)
Spoke with patient to remind of missed remote transmission 

## 2019-01-28 DIAGNOSIS — E1169 Type 2 diabetes mellitus with other specified complication: Secondary | ICD-10-CM | POA: Diagnosis not present

## 2019-02-04 DIAGNOSIS — E1121 Type 2 diabetes mellitus with diabetic nephropathy: Secondary | ICD-10-CM | POA: Diagnosis not present

## 2019-02-04 DIAGNOSIS — E1165 Type 2 diabetes mellitus with hyperglycemia: Secondary | ICD-10-CM | POA: Diagnosis not present

## 2019-02-04 DIAGNOSIS — Z7189 Other specified counseling: Secondary | ICD-10-CM | POA: Diagnosis not present

## 2019-02-04 DIAGNOSIS — I25118 Atherosclerotic heart disease of native coronary artery with other forms of angina pectoris: Secondary | ICD-10-CM | POA: Diagnosis not present

## 2019-02-04 DIAGNOSIS — E782 Mixed hyperlipidemia: Secondary | ICD-10-CM | POA: Diagnosis not present

## 2019-02-09 NOTE — Progress Notes (Signed)
Carelink Summary Report / Loop Recorder 

## 2019-03-23 ENCOUNTER — Other Ambulatory Visit: Payer: Self-pay | Admitting: Cardiology

## 2019-04-14 ENCOUNTER — Other Ambulatory Visit: Payer: Self-pay | Admitting: Cardiology

## 2019-04-27 ENCOUNTER — Ambulatory Visit (INDEPENDENT_AMBULATORY_CARE_PROVIDER_SITE_OTHER): Payer: PPO | Admitting: *Deleted

## 2019-04-27 DIAGNOSIS — I495 Sick sinus syndrome: Secondary | ICD-10-CM

## 2019-04-27 DIAGNOSIS — I4729 Other ventricular tachycardia: Secondary | ICD-10-CM

## 2019-04-27 DIAGNOSIS — I472 Ventricular tachycardia: Secondary | ICD-10-CM

## 2019-05-01 LAB — CUP PACEART REMOTE DEVICE CHECK
Battery Impedance: 361 Ohm
Battery Remaining Longevity: 114 mo
Battery Voltage: 2.79 V
Brady Statistic AP VP Percent: 0 %
Brady Statistic AP VS Percent: 96 %
Brady Statistic AS VP Percent: 0 %
Brady Statistic AS VS Percent: 4 %
Date Time Interrogation Session: 20201016211635
Implantable Lead Implant Date: 20150403
Implantable Lead Implant Date: 20150403
Implantable Lead Location: 753859
Implantable Lead Location: 753860
Implantable Lead Model: 5076
Implantable Lead Model: 5076
Implantable Pulse Generator Implant Date: 20150403
Lead Channel Impedance Value: 478 Ohm
Lead Channel Impedance Value: 680 Ohm
Lead Channel Pacing Threshold Amplitude: 0.5 V
Lead Channel Pacing Threshold Amplitude: 0.5 V
Lead Channel Pacing Threshold Pulse Width: 0.4 ms
Lead Channel Pacing Threshold Pulse Width: 0.4 ms
Lead Channel Setting Pacing Amplitude: 1.5 V
Lead Channel Setting Pacing Amplitude: 2 V
Lead Channel Setting Pacing Pulse Width: 0.4 ms
Lead Channel Setting Sensing Sensitivity: 2 mV

## 2019-05-09 NOTE — Progress Notes (Signed)
Remote pacemaker transmission.   

## 2019-05-18 ENCOUNTER — Other Ambulatory Visit: Payer: Self-pay | Admitting: Cardiology

## 2019-06-02 DIAGNOSIS — Z947 Corneal transplant status: Secondary | ICD-10-CM | POA: Diagnosis not present

## 2019-06-02 DIAGNOSIS — Z961 Presence of intraocular lens: Secondary | ICD-10-CM | POA: Diagnosis not present

## 2019-06-02 DIAGNOSIS — E113293 Type 2 diabetes mellitus with mild nonproliferative diabetic retinopathy without macular edema, bilateral: Secondary | ICD-10-CM | POA: Diagnosis not present

## 2019-06-14 DIAGNOSIS — U071 COVID-19: Secondary | ICD-10-CM

## 2019-06-14 HISTORY — DX: COVID-19: U07.1

## 2019-06-16 ENCOUNTER — Other Ambulatory Visit: Payer: Self-pay | Admitting: Cardiovascular Disease

## 2019-06-16 DIAGNOSIS — E1165 Type 2 diabetes mellitus with hyperglycemia: Secondary | ICD-10-CM | POA: Diagnosis not present

## 2019-06-16 DIAGNOSIS — E782 Mixed hyperlipidemia: Secondary | ICD-10-CM | POA: Diagnosis not present

## 2019-06-23 DIAGNOSIS — I25118 Atherosclerotic heart disease of native coronary artery with other forms of angina pectoris: Secondary | ICD-10-CM | POA: Diagnosis not present

## 2019-06-23 DIAGNOSIS — I10 Essential (primary) hypertension: Secondary | ICD-10-CM | POA: Diagnosis not present

## 2019-06-23 DIAGNOSIS — E1159 Type 2 diabetes mellitus with other circulatory complications: Secondary | ICD-10-CM | POA: Diagnosis not present

## 2019-06-23 DIAGNOSIS — E782 Mixed hyperlipidemia: Secondary | ICD-10-CM | POA: Diagnosis not present

## 2019-06-23 DIAGNOSIS — E1165 Type 2 diabetes mellitus with hyperglycemia: Secondary | ICD-10-CM | POA: Diagnosis not present

## 2019-06-23 DIAGNOSIS — E1121 Type 2 diabetes mellitus with diabetic nephropathy: Secondary | ICD-10-CM | POA: Diagnosis not present

## 2019-06-23 DIAGNOSIS — E1169 Type 2 diabetes mellitus with other specified complication: Secondary | ICD-10-CM | POA: Diagnosis not present

## 2019-07-01 ENCOUNTER — Other Ambulatory Visit: Payer: Self-pay | Admitting: Cardiovascular Disease

## 2019-07-13 ENCOUNTER — Ambulatory Visit: Payer: PPO | Attending: Internal Medicine

## 2019-07-13 DIAGNOSIS — Z20828 Contact with and (suspected) exposure to other viral communicable diseases: Secondary | ICD-10-CM | POA: Diagnosis not present

## 2019-07-13 DIAGNOSIS — Z20822 Contact with and (suspected) exposure to covid-19: Secondary | ICD-10-CM

## 2019-07-14 ENCOUNTER — Other Ambulatory Visit: Payer: Self-pay | Admitting: Cardiovascular Disease

## 2019-07-14 LAB — NOVEL CORONAVIRUS, NAA: SARS-CoV-2, NAA: DETECTED — AB

## 2019-07-27 ENCOUNTER — Ambulatory Visit (INDEPENDENT_AMBULATORY_CARE_PROVIDER_SITE_OTHER): Payer: PPO | Admitting: *Deleted

## 2019-07-27 DIAGNOSIS — I472 Ventricular tachycardia: Secondary | ICD-10-CM

## 2019-07-27 DIAGNOSIS — I4729 Other ventricular tachycardia: Secondary | ICD-10-CM

## 2019-07-28 LAB — CUP PACEART REMOTE DEVICE CHECK
Battery Impedance: 435 Ohm
Battery Remaining Longevity: 106 mo
Battery Voltage: 2.79 V
Brady Statistic AP VP Percent: 1 %
Brady Statistic AP VS Percent: 95 %
Brady Statistic AS VP Percent: 0 %
Brady Statistic AS VS Percent: 4 %
Date Time Interrogation Session: 20210114140450
Implantable Lead Implant Date: 20150403
Implantable Lead Implant Date: 20150403
Implantable Lead Location: 753859
Implantable Lead Location: 753860
Implantable Lead Model: 5076
Implantable Lead Model: 5076
Implantable Pulse Generator Implant Date: 20150403
Lead Channel Impedance Value: 456 Ohm
Lead Channel Impedance Value: 610 Ohm
Lead Channel Pacing Threshold Amplitude: 0.5 V
Lead Channel Pacing Threshold Amplitude: 0.625 V
Lead Channel Pacing Threshold Pulse Width: 0.4 ms
Lead Channel Pacing Threshold Pulse Width: 0.4 ms
Lead Channel Setting Pacing Amplitude: 1.5 V
Lead Channel Setting Pacing Amplitude: 2 V
Lead Channel Setting Pacing Pulse Width: 0.4 ms
Lead Channel Setting Sensing Sensitivity: 2 mV

## 2019-08-01 DIAGNOSIS — E782 Mixed hyperlipidemia: Secondary | ICD-10-CM | POA: Diagnosis not present

## 2019-08-01 DIAGNOSIS — E1121 Type 2 diabetes mellitus with diabetic nephropathy: Secondary | ICD-10-CM | POA: Diagnosis not present

## 2019-08-01 DIAGNOSIS — E1165 Type 2 diabetes mellitus with hyperglycemia: Secondary | ICD-10-CM | POA: Diagnosis not present

## 2019-08-08 ENCOUNTER — Encounter: Payer: Self-pay | Admitting: Cardiovascular Disease

## 2019-08-08 ENCOUNTER — Other Ambulatory Visit: Payer: Self-pay

## 2019-08-08 ENCOUNTER — Ambulatory Visit: Payer: PPO | Admitting: Cardiovascular Disease

## 2019-08-08 VITALS — BP 120/60 | HR 64 | Ht 73.0 in | Wt 211.0 lb

## 2019-08-08 DIAGNOSIS — Z950 Presence of cardiac pacemaker: Secondary | ICD-10-CM

## 2019-08-08 DIAGNOSIS — E119 Type 2 diabetes mellitus without complications: Secondary | ICD-10-CM

## 2019-08-08 DIAGNOSIS — I4729 Other ventricular tachycardia: Secondary | ICD-10-CM

## 2019-08-08 DIAGNOSIS — I25810 Atherosclerosis of coronary artery bypass graft(s) without angina pectoris: Secondary | ICD-10-CM | POA: Diagnosis not present

## 2019-08-08 DIAGNOSIS — I495 Sick sinus syndrome: Secondary | ICD-10-CM | POA: Diagnosis not present

## 2019-08-08 DIAGNOSIS — E782 Mixed hyperlipidemia: Secondary | ICD-10-CM | POA: Diagnosis not present

## 2019-08-08 DIAGNOSIS — I472 Ventricular tachycardia: Secondary | ICD-10-CM | POA: Diagnosis not present

## 2019-08-08 DIAGNOSIS — I10 Essential (primary) hypertension: Secondary | ICD-10-CM | POA: Diagnosis not present

## 2019-08-08 DIAGNOSIS — Z8679 Personal history of other diseases of the circulatory system: Secondary | ICD-10-CM

## 2019-08-08 NOTE — Patient Instructions (Signed)

## 2019-08-08 NOTE — Progress Notes (Signed)
Cardiology Office Note    Date:  08/08/2019   ID:  Erik Lane, DOB January 21, 1951, MRN [id number]  PCP:  Merrilee Seashore, MD  Cardiologist:  Roma Schanz, M.D.; Sanda Klein, MD   Chief Complaint  Patient presents with  . Pacemaker Check    History of Present Illness:  Erik Lane is a 69 y.o. male with an extensive history of coronary artery disease and previous bypass surgery, here for pacemaker check. His device is a dual-chamber Medtronic Adapta implanted April 2015 for sinus node dysfunction.  He originally received a pacemaker in 1996 (unipolar leads) sinus node dysfunction with intermittent AV block and had a generator change in 2003.  Unipolar lead "noise" began interfering with device function with numerous episodes of mode switch due to artifact.  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  The old pacemaker was deactivated but left in place and he received an entirely new system in the right subclavian area in 2015 with new bipolar leads.  Ever since the new system was implanted we have not recorded any "atrial flutter" or "atrial fibrillation" as reported on his old unipolar leads.  The patient specifically denies any chest pain at rest exertion, dyspnea at rest or with exertion, orthopnea, paroxysmal nocturnal dyspnea, syncope, palpitations, focal neurological deficits, intermittent claudication, lower extremity edema, unexplained weight gain, cough, hemoptysis or wheezing.  After retiring, 1 year ago, he has been less physically active and has gained weight.  Glycemic control has deteriorated with an A1c has gone from about 7% to 11%.  Pacemaker interrogation shows normal device function, normal lead parameters excellent with an estimated generator longevity of 8.5 years.  He has not had any episodes of atrial mode switch as before as had an occasional episode of asymptomatic but fairly lengthy lengthy nonsustained VT (15 beats at 219 bpm  on December 18, 2018, only one event since October 2019).  He has 95% atrial pacing and only 1% ventricular pacing.  Heart rate histogram distribution appears appropriate.  Past Medical History:  Diagnosis Date  . Abnormal nuclear stress test March 2007; March 2016   for DOT physical/CDL license: Inferolateral ischemia - Considered False-Positive, but cath showed 100% SVG-OM occlusion; b.medium size, moderate intensity partially reversible anterolateral defect c/w prior MI & per-infarct ischemia, normal wall motion& EF - considered c/w anatomy.  . CAD (coronary artery disease), autologous vein bypass graft February 2007   Occluded SVG-OM1 with patent LIMA-LAD & seq SVG-OM2-3  . CAD in native artery 2005   multivessel CAD, ostial 50-60% LAD, mid LAD 75-80% after D1; OM 2 with 50-60% followed by 60% after OM1. RCA small vessel. --> CABG referral  . Dyslipidemia, goal LDL below 70    on statin  . Essential hypertension   . Hernia of abdominal cavity   . Mild mitral regurgitation by prior echocardiogram 04/2013   Echo: EF 55-60%, G1 DD, mild MR  . Paroxysmal atrial fibrillation (HCC)    no recent documentation, although frequent mode switches noted but less than 2 minutes interval.  . S/P CABG x 2 2005   LIMA-LAD, SVG-OM1, seq SVG-OM 2-OM-3  . Status post cardiac pacemaker procedure 1996, 2003; 10-2013   Medtronic Adapta - replaced 10/2013  . Symptomatic sinus bradycardia 1996   status post pacemaker placement, exchanged in 2003 (Medtronic Sigma, in DDDR)    Past Surgical History:  Procedure Laterality Date  . APPENDECTOMY    . CARDIAC CATHETERIZATION  01/17/2004   multivessel  disease with osital 50-60% LAD, mid 70-80% after first diagonal, Cfx and OM2 had 50-60% lesion, 60% lesion after OM1 (Dr. Rockne Menghini)  . CARDIAC CATHETERIZATION  09/16/2005   SVG to OM1 occluded, other grafts patent, patent RCA, normal LV systolic function (Dr. Loni Muse. Little)  . CORONARY ARTERY BYPASS GRAFT  01/24/2004   LIMA-LAD,  SVG-OM1, SVG-OM2/OM3 - Dr. Prescott Gum  . GENERATOR REMOVAL  10/14/2013   Procedure: GENERATOR REMOVAL;  Surgeon: Sanda Klein, MD;  Location: [facility];  Service: Cardiovascular;;  . HERNIA REPAIR    . NM MYOCAR PERF WALL MOTION  08/2005   mod inferolateral ischemia seen at apex and mid ventricular level, EF 62%  . NM MYOCAR PERF WALL MOTION  02/26/2016   LOW RISK. EF 53%. Post CABG septal wall hypokinesis. There is a small sized mild severity defect in the mid anterolateral wall with no ischemia. Likely related to prior infarct (occluded SVG-OM1). Consistent with prior study.  Marland Kitchen [place] ; 2003; 10-14-13   symptomatic bradycardia. generator change 2003 (Medtronic Sigma) DDDR; left sided device explanted 10-14-13 with new pacing system implatned on right side - MDT ADDRL1 by Dr Sallyanne Kuster  . TRANSTHORACIC ECHOCARDIOGRAM  1996; 04/2013   normal LV function, mild LA dilatation;; b) 10/'14 - EF 55-60%, Gr 1 DD, mild LA dilation, Mild MR    Current Medications: Outpatient Medications Prior to Visit  Medication Sig Dispense Refill  . aspirin EC 81 MG tablet Take 1 tablet (81 mg total) by mouth daily. 90 tablet 3  . losartan (COZAAR) 25 MG tablet Take 25 mg by mouth daily.    . metFORMIN (GLUCOPHAGE-XR) 500 MG 24 hr tablet Take 1,000 mg by mouth 2 (two) times daily.    . metoprolol tartrate (LOPRESSOR) 25 MG tablet TAKE 1 TABLET BY MOUTH TWICE A DAY 180 tablet 0  . NITROSTAT 0.4 MG SL tablet Place 1 tablet under the tongue every 15 (fifteen) minutes as needed.    Marland Kitchen OZEMPIC, 0.25 OR 0.5 MG/DOSE, 2 MG/1.5ML SOPN Inject 0.5 mg into the skin once a week.    . rosuvastatin (CRESTOR) 20 MG tablet TAKE 1 TABLET (20 MG TOTAL) BY MOUTH DAILY. *NEEDS OFFICE VISIT FOR FURTHER REFILLS* 30 tablet 1  . empagliflozin (JARDIANCE) 25 MG TABS tablet Take by mouth.    . meloxicam (MOBIC) 15 MG tablet Take 15 mg by mouth daily.     No facility-administered medications prior to visit.     Allergies:    Patient has no known allergies.   Social History   Socioeconomic History  . Marital status: Widowed    Spouse name: Not on file  . Number of children: 2  . Years of education: Not on file  . Highest education level: Not on file  Occupational History    Employer: [employer]  Tobacco Use  . Smoking status: Never Smoker  . Smokeless tobacco: Never Used  Substance and Sexual Activity  . Alcohol use: No  . Drug use: No  . Sexual activity: Not on file  Other Topics Concern  . Not on file  Social History Narrative   He is a father of 2, grandfather 38. He does chew tobacco on daily basis but quit smoking 12 years ago. Never drinks a fall. 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   Social Determinants of Health   Financial Resource Strain:   . Difficulty  of Paying Living Expenses: Not on file  Food Insecurity:   . Worried About Charity fundraiser in the Last Year: Not on file  . Ran Out of Food in the Last Year: Not on file  Transportation Needs:   . Lack of Transportation (Medical): Not on file  . Lack of Transportation (Non-Medical): Not on file  Physical Activity:   . Days of Exercise per Week: Not on file  . Minutes of Exercise per Session: Not on file  Stress:   . Feeling of Stress : Not on file  Social Connections:   . Frequency of Communication with Friends and Family: Not on file  . Frequency of Social Gatherings with Friends and Family: Not on file  . Attends Religious Services: Not on file  . Active Member of Clubs or Organizations: Not on file  . Attends Archivist Meetings: Not on file  . Marital Status: Not on file     Family History:  The patient's family history includes Heart attack in his brother and father; Heart disease in his father; Heart failure in his father.   ROS:   Please see the history of present illness.    ROS All other systems are reviewed and are  negative.   PHYSICAL EXAM:   VS:  BP 120/60   Pulse 64   Ht 6\' 1"  (1.854 m)   Wt 211 lb (95.7 kg)   BMI 27.84 kg/m   Wt Readings from Last 3 Encounters:  08/08/19 211 lb (95.7 kg)  06/29/18 200 lb (90.7 kg)  05/05/18 200 lb 9.6 oz (91 kg)      General: Alert, oriented x3, no distress, overweight, mostly abdominal obesity.  Bilateral pacemaker sites appear healthy.  The right subclavian pacemaker is the active one. Head: no evidence of trauma, PERRL, EOMI, no exophtalmos or lid lag, no myxedema, no xanthelasma; normal ears, nose and oropharynx Neck: normal jugular venous pulsations and no hepatojugular reflux; brisk carotid pulses without delay and no carotid bruits Chest: clear to auscultation, no signs of consolidation by percussion or palpation, normal fremitus, symmetrical and full respiratory excursions Cardiovascular: normal position and quality of the apical impulse, regular rhythm, normal first and second heart sounds, no murmurs, rubs or gallops Abdomen: no tenderness or distention, no masses by palpation, no abnormal pulsatility or arterial bruits, normal bowel sounds, no hepatosplenomegaly Extremities: no clubbing, cyanosis or edema; 2+ radial, ulnar and brachial pulses bilaterally; 2+ right femoral, posterior tibial and dorsalis pedis pulses; 2+ left femoral, posterior tibial and dorsalis pedis pulses; no subclavian or femoral bruits Neurological: grossly nonfocal Psych: Normal mood and affect   Studies/Labs Reviewed:   Lipid Panel     Component Value Date/Time   CHOL 154 07/28/2017 0817   TRIG 116 07/28/2017 0817   HDL 43 07/28/2017 0817   CHOLHDL 3.6 07/28/2017 0817   LDLCALC 88 07/28/2017 0817   LABVLDL 23 07/28/2017 0817   06/16/2019 total cholesterol 150, HDL 36, triglycerides 185, LDL 79 Hemoglobin A1c 11.1% Creatinine 1.04 EKG:  EKG is ordered today.  It shows A paced, V sensed rhythm, RBBB. W+QTc 431 ms. Additional studies/ records that were reviewed  today include:  Comprehensive pacemaker check.  ASSESSMENT:    1. NSVT (nonsustained ventricular tachycardia) ([place])   2. SSS (sick sinus syndrome) ([place])   3. Pacemaker   4. Coronary artery disease involving coronary bypass graft of native heart without angina pectoris   5. History of atrial flutter   6. Essential  hypertension   7. Mixed hyperlipidemia   8. Diabetes mellitus type 2 in nonobese [facility])      PLAN:  In order of problems listed above:  1. NSVT: Only one event in the last 15 months.  Asymptomatic.  On beta-blockers. 2. SSS: Heart rate histogram distribution is appropriate for his current level of activity. 3. PPM: Normal function of the right subclavian pacemaker.  The left sided pacemaker is deactivated (unipolar leads, and occluded subclavian vein on the left). 4. CAD: Asymptomatic.  Low risk nuclear stress test December 2019. 5. Reported past atrial flutter: This was artifact related to his old unipolar leads. 6. HTN: Good control. 7. HLP: On rosuvastatin.  HDL is low, triglycerides high, both reflective of metabolic syndrome in keeping with poorly controlled diabetes mellitus. 8. DM: I think the key here is to increase physical activity.   Medication Adjustments/Labs and Tests Ordered: Current medicines are reviewed at length with the patient today.  Concerns regarding medicines are outlined above.  Medication changes, Labs and Tests ordered today are listed in the Patient Instructions below. Patient Instructions  Medication Instructions:  No changes  *If you need a refill on your cardiac medications before your next appointment, please call your pharmacy*  Lab Work: None ordered If you have labs (blood work) drawn today and your tests are completely normal, you will receive your results only by: Marland Kitchen MyChart Message (if you have MyChart) OR . A paper copy in the mail If you have any lab test that is abnormal or we need to change your treatment, we will call you to  review the results.  Testing/Procedures: None ordered  Follow-Up: At [facility], you and your health needs are our priority.  As part of our continuing mission to provide you with exceptional heart care, we have created designated Provider Care Teams.  These Care Teams include your primary Cardiologist (physician) and Advanced Practice Providers (APPs -  Physician Assistants and Nurse Practitioners) who all work together to provide you with the care you need, when you need it.  Your next appointment:   12 month(s)  The format for your next appointment:   In Person  Provider:   Sanda Klein, MD     Signed, Sanda Klein, MD  08/08/2019 8:44 AM    [facility] [address], [place], [place]  [address] Phone: [phone]; Fax: [phone]

## 2019-08-12 ENCOUNTER — Other Ambulatory Visit: Payer: Self-pay | Admitting: Cardiovascular Disease

## 2019-08-12 DIAGNOSIS — H40042 Steroid responder, left eye: Secondary | ICD-10-CM | POA: Diagnosis not present

## 2019-08-12 DIAGNOSIS — E113293 Type 2 diabetes mellitus with mild nonproliferative diabetic retinopathy without macular edema, bilateral: Secondary | ICD-10-CM | POA: Diagnosis not present

## 2019-08-12 DIAGNOSIS — H35372 Puckering of macula, left eye: Secondary | ICD-10-CM | POA: Diagnosis not present

## 2019-08-12 DIAGNOSIS — Z947 Corneal transplant status: Secondary | ICD-10-CM | POA: Diagnosis not present

## 2019-08-12 DIAGNOSIS — Z961 Presence of intraocular lens: Secondary | ICD-10-CM | POA: Diagnosis not present

## 2019-08-12 DIAGNOSIS — H18513 Endothelial corneal dystrophy, bilateral: Secondary | ICD-10-CM | POA: Diagnosis not present

## 2019-09-23 ENCOUNTER — Ambulatory Visit: Payer: PPO | Admitting: Cardiology

## 2019-09-23 ENCOUNTER — Encounter: Payer: Self-pay | Admitting: Cardiology

## 2019-09-23 ENCOUNTER — Other Ambulatory Visit: Payer: Self-pay

## 2019-09-23 VITALS — BP 116/70 | HR 76 | Temp 95.5°F | Ht 73.0 in | Wt 205.0 lb

## 2019-09-23 DIAGNOSIS — I25718 Atherosclerosis of autologous vein coronary artery bypass graft(s) with other forms of angina pectoris: Secondary | ICD-10-CM

## 2019-09-23 DIAGNOSIS — I2510 Atherosclerotic heart disease of native coronary artery without angina pectoris: Secondary | ICD-10-CM | POA: Diagnosis not present

## 2019-09-23 DIAGNOSIS — I10 Essential (primary) hypertension: Secondary | ICD-10-CM | POA: Diagnosis not present

## 2019-09-23 DIAGNOSIS — I4729 Other ventricular tachycardia: Secondary | ICD-10-CM

## 2019-09-23 DIAGNOSIS — E1165 Type 2 diabetes mellitus with hyperglycemia: Secondary | ICD-10-CM | POA: Diagnosis not present

## 2019-09-23 DIAGNOSIS — Z950 Presence of cardiac pacemaker: Secondary | ICD-10-CM

## 2019-09-23 DIAGNOSIS — E1121 Type 2 diabetes mellitus with diabetic nephropathy: Secondary | ICD-10-CM | POA: Diagnosis not present

## 2019-09-23 DIAGNOSIS — E785 Hyperlipidemia, unspecified: Secondary | ICD-10-CM | POA: Diagnosis not present

## 2019-09-23 DIAGNOSIS — I472 Ventricular tachycardia: Secondary | ICD-10-CM

## 2019-09-23 DIAGNOSIS — E782 Mixed hyperlipidemia: Secondary | ICD-10-CM | POA: Diagnosis not present

## 2019-09-23 DIAGNOSIS — Z951 Presence of aortocoronary bypass graft: Secondary | ICD-10-CM | POA: Diagnosis not present

## 2019-09-23 MED ORDER — ROSUVASTATIN CALCIUM 40 MG PO TABS: 40.0000 mg | ORAL_TABLET | Freq: Every day | ORAL | 3 refills | Status: DC

## 2019-09-23 NOTE — Assessment & Plan Note (Signed)
Unfortunately, he has been off his rosuvastatin for 2 months.  His labs were just checked in December by Dr. Ashby Dawes, however, the LDL portions is not adequately reported on K PN.   We will restart rosuvastatin, but will start at 40 mg to make up for him being off of it.  Anticipate reassessment in the summertime.  I will see him back in July timeframe.

## 2019-09-23 NOTE — Assessment & Plan Note (Signed)
Well-documented occluded SVG-OM1 which is probably needs to his abnormal stress test, but other grafts are open.  I suspect the area of perfusion abnormality on stress test is consistent with known anatomy.

## 2019-09-23 NOTE — Progress Notes (Signed)
Primary Care Provider: Merrilee Seashore, MD Cardiologist: Glenetta Hew, MD Electrophysiologist: None  Clinic Note: Chief Complaint  Patient presents with  . Follow-up    15 months.  . Shortness of Breath    At times.    HPI:    Erik Lane is a 69 y.o. male with a PMH below who presents today for delayed-32-month follow-up.  Originally scheduled follow-up was rescheduled and then somehow not properly scheduled due to COVID-19 issues..  MV CAD- 2005--> CABG   Myoview done in 2007 for DOT physical --> inferolateral ischemia and was considered to be a false positive (but was probably more c/w a fixed &not reversible defect  ? Catheterization showed an occluded SVG to OM1 a patent SVG OM 2-OM3 and patent LIMA.   Myoview in Aug 2017 (small, mostly fixed mid anterolateral wall defect - c/w known occluded SVG-OM1) which appeared to be relatively same as his last. Therefore we just monitored.  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   He is mostly recently seen by Dr. Sanda Klein in January for follow-up of his device.  Indicated he retired about a year ago.  Very well.  Less physically active since retiring gained weight.  Pacemaker functioning normally.  No atrial mode switches.  Had a fairly light of nonsustained ventricular tachycardia (15 beats).  Only one other notable event since October 2019.  95% atrial pacing 1% ventricular pacing.  Recent Hospitalizations: None  Reviewed  CV studies:    The following studies were reviewed today: (if available, images/films reviewed: From Epic Chart or Care Everywhere) . December 2019-Myoview: EF 50 to 54%.  Small area of anterolateral ischemia in basal level-having appears to be more consistent with prior infarction.  LOW-INTERMEDIATE RISK (NO chang)   Interval History:   Erik Lane returns today pretty much with a similar issues resolved  his had.  Mostly notes fatigue, says that he breathes harder his walking assisted down to catch his breath because of fatigue.  No chest pain or pressure.  He also indicates he has not been very active since retiring last year.  When he was working he is to walk a least 3 miles a day or more complete heart shift.  Now he is barely doing anything.  Mostly notes no energy.  He denies any other symptoms such as PND orthopnea or edema to suggest heart failure.  He has no signs or symptoms of arrhythmias.  He had no recollection of the episode in June of last year.  Denies any claudication but does still have some right calf pain which we have evaluated in the past.  No edema.  He indicates that he recently diagnosed with diabetes.  He also indicates that over the last 2 months he not been taking his rosuvastatin-most likely his prescription ran out and because of the prolonged time between last visits, it was not refilled automatically.  CV Review of Symptoms (Summary) Cardiovascular ROS: positive for - dyspnea on exertion and Fatigue negative for - chest pain, edema, irregular heartbeat, orthopnea, palpitations, paroxysmal nocturnal dyspnea, rapid heart rate, shortness of breath or Syncope/near syncope, TIA/amaurosis fugax, claudication  The patient does not have symptoms concerning for COVID-19 infection (fever, chills, cough, or new shortness of breath).  -- Was + Covid in December - no respiratory Sx, just felt bad.   The patient is practicing social distancing & Masking.  REVIEWED OF SYSTEMS   Review of Systems  Constitutional: Positive for malaise/fatigue.  HENT: Negative for nosebleeds.   Gastrointestinal: Negative for blood in stool and melena.  Genitourinary: Negative for hematuria.  Musculoskeletal: Positive for myalgias (Still has a right calf pain, but much less cramps now that is not working.). Negative for joint pain.  Neurological: Positive for weakness (Generalized). Negative  for dizziness and focal weakness.  Psychiatric/Behavioral: Negative for memory loss. The patient is not nervous/anxious and does not have insomnia.     I have reviewed and (if needed) personally updated the patient's problem list, medications, allergies, past medical and surgical history, social and family history.   PAST MEDICAL HISTORY   Past Medical History:  Diagnosis Date  . Abnormal nuclear stress test March 2007; March 2016   for DOT physical/CDL license: Inferolateral ischemia - Considered False-Positive, but cath showed 100% SVG-OM occlusion; b.medium size, moderate intensity partially reversible anterolateral defect c/w prior MI & per-infarct ischemia, normal wall motion& EF - considered c/w anatomy.  . CAD (coronary artery disease), autologous vein bypass graft February 2007   Occluded SVG-OM1 with patent LIMA-LAD & seq SVG-OM2-3  . CAD in native artery 2005   multivessel CAD, ostial 50-60% LAD, mid LAD 75-80% after D1; OM 2 with 50-60% followed by 60% after OM1. RCA small vessel. --> CABG referral  . Dyslipidemia, goal LDL below 70    on statin  . Essential hypertension   . Hernia of abdominal cavity   . Mild mitral regurgitation by prior echocardiogram 04/2013   Echo: EF 55-60%, G1 DD, mild MR  . Paroxysmal atrial fibrillation (HCC)    no recent documentation, although frequent mode switches noted but less than 2 minutes interval.  . S/P CABG x 2 2005   LIMA-LAD, SVG-OM1, seq SVG-OM 2-OM-3  . Status post cardiac pacemaker procedure 1996, 2003; 10-2013   Medtronic Adapta - replaced 10/2013  . Symptomatic sinus bradycardia 1996   status post pacemaker placement, exchanged in 2003 (Medtronic Sigma, in DDDR)    PAST SURGICAL HISTORY   Past Surgical History:  Procedure Laterality Date  . APPENDECTOMY    . CARDIAC CATHETERIZATION  01/17/2004   multivessel disease with osital 50-60% LAD, mid 70-80% after first diagonal, Cfx and OM2 had 50-60% lesion, 60% lesion after OM1 (Dr.  Rockne Menghini)  . CARDIAC CATHETERIZATION  09/16/2005   SVG to OM1 occluded, other grafts patent, patent RCA, normal LV systolic function (Dr. Loni Muse. Little)  . CORONARY ARTERY BYPASS GRAFT  01/24/2004   LIMA-LAD, SVG-OM1, SVG-OM2/OM3 - Dr. Prescott Gum  . GENERATOR REMOVAL  10/14/2013   Procedure: GENERATOR REMOVAL;  Surgeon: Sanda Klein, MD;  Location: [facility];  Service: Cardiovascular;;  . HERNIA REPAIR    . NM MYOCAR PERF WALL MOTION  08/2005   mod inferolateral ischemia seen at apex and mid ventricular level, EF 62%  . NM [facility] PERF WALL MOTION  August 2017, December 2019   a) LOW RISK. EF 53%. Post CABG septal wall hypokinesis. There is a small sized mild severity defect in the mid anterolateral wall with no ischemia. Likely related to prior infarct (occluded SVG-OM1). Consistent with prior study.;; b) EF 50 to 54%.  Small area of inferolateral ischemia and basal level.  Report as ischemia -more c/w infarction.  Read as intermediate but interpreted as low RIK  . [place] ; 2003; 10-14-13   symptomatic bradycardia. generator change 2003 (Medtronic Sigma) DDDR; left sided device explanted 10-14-13 with new  pacing system implatned on right side - MDT ADDRL1 by Dr Sallyanne Kuster  . TRANSTHORACIC ECHOCARDIOGRAM  1996; 04/2013   normal LV function, mild LA dilatation;; b) 10/'14 - EF 55-60%, Gr 1 DD, mild LA dilation, Mild MR    MEDICATIONS/ALLERGIES   Current Meds  Medication Sig  . aspirin EC 81 MG tablet Take 1 tablet (81 mg total) by mouth daily.  Marland Kitchen losartan (COZAAR) 25 MG tablet Take 25 mg by mouth daily.  . metFORMIN (GLUCOPHAGE-XR) 500 MG 24 hr tablet Take 1,000 mg by mouth 2 (two) times daily.  . metoprolol tartrate (LOPRESSOR) 25 MG tablet TAKE 1 TABLET BY MOUTH TWICE A DAY  . NITROSTAT 0.4 MG SL tablet Place 1 tablet under the tongue every 15 (fifteen) minutes as needed.  Marland Kitchen OZEMPIC, 0.25 OR 0.5 MG/DOSE, 2 MG/1.5ML SOPN Inject 0.5 mg into the skin once a week.  . [DISCONTINUED]  rosuvastatin (CRESTOR) 20 MG tablet TAKE 1 TABLET (20 MG TOTAL) BY MOUTH DAILY. *NEEDS OFFICE VISIT FOR FURTHER REFILLS*  --He indicates that he has been out of rosuvastatin for the last 2 months.  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    OBJCTIVE -PE, EKG, labs   Wt Readings from Last 3 Encounters:  09/23/19 205 lb (93 kg)  08/08/19 211 lb (95.7 kg)  06/29/18 200 lb (90.7 kg)    Physical Exam: BP 116/70 (BP Location: Left Arm, Patient Position: Sitting, Cuff Size: Normal)   Pulse 76   Temp (!) 95.5 F (35.3 C)   Ht 6\' 1"  (1.854 m)   Wt 205 lb (93 kg)   BMI 27.05 kg/m  Physical Exam  Constitutional: He is oriented to person, place, and time. He appears well-developed and well-nourished.  HENT:  Head: Normocephalic and atraumatic.  Neck: No JVD present.  Cardiovascular: Normal rate, regular rhythm and S1 normal.  No extrasystoles are present. PMI is not displaced (She is older). Exam reveals no gallop, no distant heart sounds, no friction rub and no decreased pulses.  No murmur heard. Split s2  Pulmonary/Chest: Effort normal and breath sounds normal. No respiratory distress. He has no wheezes. He has no rales.  Abdominal: Soft. Bowel sounds are normal. He exhibits no distension. There is no abdominal tenderness. There is no rebound.  Musculoskeletal:        General: No edema. Normal range of motion.     Cervical back: Normal range of motion and neck supple.  Neurological: He is alert and oriented to person, place, and time.  Psychiatric: He has a normal mood and affect. His behavior is normal. Judgment and thought content normal.  Vitals reviewed.    Adult ECG Report N/A  Recent Labs: Dec 2020 -> TC 150, HDL 36,LDL (not reported), TG 185.   A1c 11.1.  No results found for: TSH  ASSESSMENT/PLAN    Problem List Items Addressed This Visit    Coronary artery disease  involving native coronary artery of native heart without angina pectoris - Primary (Chronic)    Longstanding history of CAD.  No anginal symptoms.  Several stress tests are suggested ischemia in the OM distribution.  This has been what was seen on cardiac catheterization.  Likely not having any new ischemia, but he is noting exertional shortness of breath and fatigue.  This is not a new finding for him, and he has been much more sedentary over the last year  and a half. Recommendations continue treatment increase his exercise level to reassess.  Plan: Reinitiate statin (no change in symptoms with being on statin), continue beta-blocker and ARB.  Continue aspirin.      Relevant Medications   rosuvastatin (CRESTOR) 40 MG tablet   S/P CABG x 4 : LIMA-LAD, SVG-OM1 (known to be occluded), seq SVG-OM 2-OM 3 (Chronic)   Status post cardiac pacemaker procedure (Chronic)    Monitored by Dr. Sallyanne Kuster.  Functioning well      Essential hypertension (Chronic)    Blood pressure well controlled.  Continue ARB and beta-blocker.      Relevant Medications   rosuvastatin (CRESTOR) 40 MG tablet   Dyslipidemia, goal LDL below 70 (Chronic)    Unfortunately, he has been off his rosuvastatin for 2 months.  His labs were just checked in December by Dr. Ashby Dawes, however, the LDL portions is not adequately reported on K PN.   We will restart rosuvastatin, but will start at 40 mg to make up for him being off of it.  Anticipate reassessment in the summertime.  I will see him back in July timeframe.      Relevant Medications   rosuvastatin (CRESTOR) 40 MG tablet   CAD (coronary artery disease), autologous vein bypass graft (Chronic)    Well-documented occluded SVG-OM1 which is probably needs to his abnormal stress test, but other grafts are open.  I suspect the area of perfusion abnormality on stress test is consistent with known anatomy.      Relevant Medications   rosuvastatin (CRESTOR) 40 MG tablet    NSVT (nonsustained ventricular tachycardia) (HCC) (Chronic)    Monomorphic VT on pacer noted.  Was converted from diltiazem to beta-blocker.  No new ischemia noted on Myoview.  Only one episode in the last 18+ months.  Continue beta-blocker      Relevant Medications   rosuvastatin (CRESTOR) 40 MG tablet      COVID-19 Education: The signs and symptoms of COVID-19 were discussed with the patient and how to seek care for testing (follow up with PCP or arrange E-visit).   The importance of social distancing was discussed today.  I spent a total of 48minutes with the patient. >  50% of the time was spent in direct patient consultation.  Additional time spent with chart review  / charting (studies, outside notes, etc): 6 Total Time: 24 min   Current medicines are reviewed at length with the patient today.  (+/- concerns) noted that he has been out of rosuvastatin.   Patient Instructions / Medication Changes & Studies & Tests Ordered   Patient Instructions  Medication Instructions:  Increase Rosuvastatin to 40 mg one daily   *If you need a refill on your cardiac medications before your next appointment, please call your pharmacy*   Lab Work: Not needed- please have primary to send a copy of labs once completed   Testing/Procedures: Not needed   Follow-Up: At [facility], you and your health needs are our priority.  As part of our continuing mission to provide you with exceptional heart care, we have created designated Provider Care Teams.  These Care Teams include your primary Cardiologist (physician) and Advanced Practice Providers (APPs -  Physician Assistants and Nurse Practitioners) who all work together to provide you with the care you need, when you need it.  We recommend signing up for the patient portal called "MyChart".  Sign up information is provided on this After Visit Summary.  MyChart is used to connect  with patients for Virtual Visits (Telemedicine).  Patients  are able to view lab/test results, encounter notes, upcoming appointments, etc.  Non-urgent messages can be sent to your provider as well.   To learn more about what you can do with MyChart, go to [email or web address].    Your next appointment:   5 month(s)  The format for your next appointment:   In Person  Provider:   Glenetta Hew, MD   Other Instructions Increase your exercising    Studies Ordered:   No orders of the defined types were placed in this encounter.    Glenetta Hew, M.D., M.S. Interventional Cardiologist   Pager # [phone] Phone # [phone] [address] [place], [place] [address]   Thank you for choosing Heartcare at [facility]!!

## 2019-09-23 NOTE — Assessment & Plan Note (Signed)
Longstanding history of CAD.  No anginal symptoms.  Several stress tests are suggested ischemia in the OM distribution.  This has been what was seen on cardiac catheterization.  Likely not having any new ischemia, but he is noting exertional shortness of breath and fatigue.  This is not a new finding for him, and he has been much more sedentary over the last year and a half. Recommendations continue treatment increase his exercise level to reassess.  Plan: Reinitiate statin (no change in symptoms with being on statin), continue beta-blocker and ARB.  Continue aspirin.

## 2019-09-23 NOTE — Assessment & Plan Note (Signed)
Monomorphic VT on pacer noted.  Was converted from diltiazem to beta-blocker.  No new ischemia noted on Myoview.  Only one episode in the last 18+ months.  Continue beta-blocker

## 2019-09-23 NOTE — Assessment & Plan Note (Signed)
Monitored by Dr. Sallyanne Kuster.  Functioning well

## 2019-09-23 NOTE — Assessment & Plan Note (Signed)
Blood pressure well controlled.  Continue ARB and beta-blocker.

## 2019-09-23 NOTE — Patient Instructions (Signed)
Medication Instructions:  Increase Rosuvastatin to 40 mg one daily   *If you need a refill on your cardiac medications before your next appointment, please call your pharmacy*   Lab Work: Not needed- please have primary to send a copy of labs once completed   Testing/Procedures: Not needed   Follow-Up: At [facility], you and your health needs are our priority.  As part of our continuing mission to provide you with exceptional heart care, we have created designated Provider Care Teams.  These Care Teams include your primary Cardiologist (physician) and Advanced Practice Providers (APPs -  Physician Assistants and Nurse Practitioners) who all work together to provide you with the care you need, when you need it.  We recommend signing up for the patient portal called "MyChart".  Sign up information is provided on this After Visit Summary.  MyChart is used to connect with patients for Virtual Visits (Telemedicine).  Patients are able to view lab/test results, encounter notes, upcoming appointments, etc.  Non-urgent messages can be sent to your provider as well.   To learn more about what you can do with MyChart, go to [email or web address].    Your next appointment:   5 month(s)  The format for your next appointment:   In Person  Provider:   Glenetta Hew, MD   Other Instructions Increase your exercising

## 2019-09-27 ENCOUNTER — Other Ambulatory Visit: Payer: Self-pay | Admitting: Cardiovascular Disease

## 2019-09-30 DIAGNOSIS — I25118 Atherosclerotic heart disease of native coronary artery with other forms of angina pectoris: Secondary | ICD-10-CM | POA: Diagnosis not present

## 2019-09-30 DIAGNOSIS — I10 Essential (primary) hypertension: Secondary | ICD-10-CM | POA: Diagnosis not present

## 2019-09-30 DIAGNOSIS — E1169 Type 2 diabetes mellitus with other specified complication: Secondary | ICD-10-CM | POA: Diagnosis not present

## 2019-09-30 DIAGNOSIS — E1121 Type 2 diabetes mellitus with diabetic nephropathy: Secondary | ICD-10-CM | POA: Diagnosis not present

## 2019-09-30 DIAGNOSIS — E1165 Type 2 diabetes mellitus with hyperglycemia: Secondary | ICD-10-CM | POA: Diagnosis not present

## 2019-10-26 ENCOUNTER — Ambulatory Visit (INDEPENDENT_AMBULATORY_CARE_PROVIDER_SITE_OTHER): Payer: PPO | Admitting: *Deleted

## 2019-10-26 DIAGNOSIS — I472 Ventricular tachycardia: Secondary | ICD-10-CM | POA: Diagnosis not present

## 2019-10-26 DIAGNOSIS — I4729 Other ventricular tachycardia: Secondary | ICD-10-CM

## 2019-10-26 LAB — CUP PACEART REMOTE DEVICE CHECK
Battery Impedance: 537 Ohm
Battery Remaining Longevity: 98 mo
Battery Voltage: 2.79 V
Brady Statistic AP VP Percent: 1 %
Brady Statistic AP VS Percent: 91 %
Brady Statistic AS VP Percent: 0 %
Brady Statistic AS VS Percent: 8 %
Date Time Interrogation Session: 20210414100504
Implantable Lead Implant Date: 20150403
Implantable Lead Implant Date: 20150403
Implantable Lead Location: 753859
Implantable Lead Location: 753860
Implantable Lead Model: 5076
Implantable Lead Model: 5076
Implantable Pulse Generator Implant Date: 20150403
Lead Channel Impedance Value: 476 Ohm
Lead Channel Impedance Value: 671 Ohm
Lead Channel Pacing Threshold Amplitude: 0.5 V
Lead Channel Pacing Threshold Amplitude: 0.625 V
Lead Channel Pacing Threshold Pulse Width: 0.4 ms
Lead Channel Pacing Threshold Pulse Width: 0.4 ms
Lead Channel Setting Pacing Amplitude: 1.5 V
Lead Channel Setting Pacing Amplitude: 2 V
Lead Channel Setting Pacing Pulse Width: 0.4 ms
Lead Channel Setting Sensing Sensitivity: 2 mV

## 2019-10-26 NOTE — Progress Notes (Signed)
PPM Remote  

## 2019-11-01 ENCOUNTER — Ambulatory Visit: Payer: PPO | Attending: Internal Medicine

## 2019-11-01 DIAGNOSIS — Z23 Encounter for immunization: Secondary | ICD-10-CM

## 2019-11-01 NOTE — Progress Notes (Signed)
   Covid-19 Vaccination Clinic  Name:  Erik Lane    MRN: [id number] DOB: 25-Mar-1951  11/01/2019  [name] was observed post Covid-19 immunization for 15 minutes without incident. He was provided with Vaccine Information Sheet and instruction to access the V-Safe system.   [name] was instructed to call 911 with any severe reactions post vaccine: Marland Kitchen Difficulty breathing  . Swelling of face and throat  . A fast heartbeat  . A bad rash all over body  . Dizziness and weakness   Immunizations Administered    Name Date Dose VIS Date Route   Pfizer COVID-19 Vaccine 11/01/2019 10:07 AM 0.3 mL 09/07/2018 Intramuscular   Manufacturer: [place]   Lot: [id number]   [place]: [id number]

## 2019-11-18 DIAGNOSIS — E782 Mixed hyperlipidemia: Secondary | ICD-10-CM | POA: Diagnosis not present

## 2019-11-18 DIAGNOSIS — I25118 Atherosclerotic heart disease of native coronary artery with other forms of angina pectoris: Secondary | ICD-10-CM | POA: Diagnosis not present

## 2019-11-18 DIAGNOSIS — R142 Eructation: Secondary | ICD-10-CM | POA: Diagnosis not present

## 2019-11-18 DIAGNOSIS — K219 Gastro-esophageal reflux disease without esophagitis: Secondary | ICD-10-CM | POA: Diagnosis not present

## 2019-11-18 DIAGNOSIS — E1121 Type 2 diabetes mellitus with diabetic nephropathy: Secondary | ICD-10-CM | POA: Diagnosis not present

## 2019-11-18 DIAGNOSIS — E1165 Type 2 diabetes mellitus with hyperglycemia: Secondary | ICD-10-CM | POA: Diagnosis not present

## 2019-11-22 ENCOUNTER — Ambulatory Visit: Payer: PPO | Attending: Internal Medicine

## 2019-11-22 DIAGNOSIS — Z23 Encounter for immunization: Secondary | ICD-10-CM

## 2019-11-22 NOTE — Progress Notes (Signed)
   Covid-19 Vaccination Clinic  Name:  Erik Lane    MRN: [id number] DOB: 12/23/1950  11/22/2019  Erik Lane was observed post Covid-19 immunization for 15 minutes without incident. He was provided with Vaccine Information Sheet and instruction to access the V-Safe system.   Erik Lane was instructed to call 911 with any severe reactions post vaccine: Marland Kitchen Difficulty breathing  . Swelling of face and throat  . A fast heartbeat  . A bad rash all over body  . Dizziness and weakness   Immunizations Administered    Name Date Dose VIS Date Route   Pfizer COVID-19 Vaccine 11/22/2019  8:30 AM 0.3 mL 09/07/2018 Intramuscular   Manufacturer: [place]   Lot: [id number]   [place]: [id number]

## 2020-01-25 ENCOUNTER — Ambulatory Visit (INDEPENDENT_AMBULATORY_CARE_PROVIDER_SITE_OTHER): Payer: PPO | Admitting: *Deleted

## 2020-01-25 DIAGNOSIS — I495 Sick sinus syndrome: Secondary | ICD-10-CM

## 2020-01-27 DIAGNOSIS — Z0000 Encounter for general adult medical examination without abnormal findings: Secondary | ICD-10-CM | POA: Diagnosis not present

## 2020-01-27 DIAGNOSIS — I25118 Atherosclerotic heart disease of native coronary artery with other forms of angina pectoris: Secondary | ICD-10-CM | POA: Diagnosis not present

## 2020-01-27 DIAGNOSIS — E782 Mixed hyperlipidemia: Secondary | ICD-10-CM | POA: Diagnosis not present

## 2020-01-27 DIAGNOSIS — E1165 Type 2 diabetes mellitus with hyperglycemia: Secondary | ICD-10-CM | POA: Diagnosis not present

## 2020-01-27 DIAGNOSIS — E1121 Type 2 diabetes mellitus with diabetic nephropathy: Secondary | ICD-10-CM | POA: Diagnosis not present

## 2020-01-27 LAB — CUP PACEART REMOTE DEVICE CHECK
Battery Impedance: 614 Ohm
Battery Remaining Longevity: 92 mo
Battery Voltage: 2.79 V
Brady Statistic AP VP Percent: 1 %
Brady Statistic AP VS Percent: 93 %
Brady Statistic AS VP Percent: 0 %
Brady Statistic AS VS Percent: 6 %
Date Time Interrogation Session: 20210715111514
Implantable Lead Implant Date: 20150403
Implantable Lead Implant Date: 20150403
Implantable Lead Location: 753859
Implantable Lead Location: 753860
Implantable Lead Model: 5076
Implantable Lead Model: 5076
Implantable Pulse Generator Implant Date: 20150403
Lead Channel Impedance Value: 476 Ohm
Lead Channel Impedance Value: 619 Ohm
Lead Channel Pacing Threshold Amplitude: 0.5 V
Lead Channel Pacing Threshold Amplitude: 0.625 V
Lead Channel Pacing Threshold Pulse Width: 0.4 ms
Lead Channel Pacing Threshold Pulse Width: 0.4 ms
Lead Channel Setting Pacing Amplitude: 1.5 V
Lead Channel Setting Pacing Amplitude: 2 V
Lead Channel Setting Pacing Pulse Width: 0.4 ms
Lead Channel Setting Sensing Sensitivity: 2 mV

## 2020-01-31 NOTE — Progress Notes (Signed)
Remote pacemaker transmission.   

## 2020-02-03 DIAGNOSIS — I4892 Unspecified atrial flutter: Secondary | ICD-10-CM | POA: Diagnosis not present

## 2020-02-03 DIAGNOSIS — E1121 Type 2 diabetes mellitus with diabetic nephropathy: Secondary | ICD-10-CM | POA: Diagnosis not present

## 2020-02-03 DIAGNOSIS — Z0000 Encounter for general adult medical examination without abnormal findings: Secondary | ICD-10-CM | POA: Diagnosis not present

## 2020-02-03 DIAGNOSIS — I25119 Atherosclerotic heart disease of native coronary artery with unspecified angina pectoris: Secondary | ICD-10-CM | POA: Diagnosis not present

## 2020-02-03 DIAGNOSIS — E1169 Type 2 diabetes mellitus with other specified complication: Secondary | ICD-10-CM | POA: Diagnosis not present

## 2020-02-03 DIAGNOSIS — E1165 Type 2 diabetes mellitus with hyperglycemia: Secondary | ICD-10-CM | POA: Diagnosis not present

## 2020-02-03 DIAGNOSIS — I25118 Atherosclerotic heart disease of native coronary artery with other forms of angina pectoris: Secondary | ICD-10-CM | POA: Diagnosis not present

## 2020-02-03 DIAGNOSIS — I10 Essential (primary) hypertension: Secondary | ICD-10-CM | POA: Diagnosis not present

## 2020-02-03 DIAGNOSIS — E782 Mixed hyperlipidemia: Secondary | ICD-10-CM | POA: Diagnosis not present

## 2020-02-03 DIAGNOSIS — E1159 Type 2 diabetes mellitus with other circulatory complications: Secondary | ICD-10-CM | POA: Diagnosis not present

## 2020-02-03 DIAGNOSIS — K219 Gastro-esophageal reflux disease without esophagitis: Secondary | ICD-10-CM | POA: Diagnosis not present

## 2020-03-16 ENCOUNTER — Ambulatory Visit: Payer: PPO | Admitting: Cardiology

## 2020-03-16 ENCOUNTER — Encounter: Payer: Self-pay | Admitting: Cardiology

## 2020-03-16 ENCOUNTER — Other Ambulatory Visit: Payer: Self-pay

## 2020-03-16 VITALS — BP 104/72 | HR 61 | Ht 73.0 in | Wt 213.2 lb

## 2020-03-16 DIAGNOSIS — I2510 Atherosclerotic heart disease of native coronary artery without angina pectoris: Secondary | ICD-10-CM | POA: Diagnosis not present

## 2020-03-16 DIAGNOSIS — I495 Sick sinus syndrome: Secondary | ICD-10-CM | POA: Diagnosis not present

## 2020-03-16 DIAGNOSIS — E785 Hyperlipidemia, unspecified: Secondary | ICD-10-CM | POA: Diagnosis not present

## 2020-03-16 DIAGNOSIS — Z8679 Personal history of other diseases of the circulatory system: Secondary | ICD-10-CM | POA: Diagnosis not present

## 2020-03-16 DIAGNOSIS — I4729 Other ventricular tachycardia: Secondary | ICD-10-CM

## 2020-03-16 DIAGNOSIS — R5383 Other fatigue: Secondary | ICD-10-CM

## 2020-03-16 DIAGNOSIS — R0600 Dyspnea, unspecified: Secondary | ICD-10-CM

## 2020-03-16 DIAGNOSIS — I472 Ventricular tachycardia: Secondary | ICD-10-CM | POA: Diagnosis not present

## 2020-03-16 DIAGNOSIS — R001 Bradycardia, unspecified: Secondary | ICD-10-CM | POA: Diagnosis not present

## 2020-03-16 DIAGNOSIS — I10 Essential (primary) hypertension: Secondary | ICD-10-CM | POA: Diagnosis not present

## 2020-03-16 DIAGNOSIS — Z951 Presence of aortocoronary bypass graft: Secondary | ICD-10-CM

## 2020-03-16 DIAGNOSIS — R0609 Other forms of dyspnea: Secondary | ICD-10-CM | POA: Insufficient documentation

## 2020-03-16 NOTE — Patient Instructions (Addendum)
Medication Instructions:  Continue same medications *If you need a refill on your cardiac medications before your next appointment, please call your pharmacy*   Lab Work: None ordered   Testing/Procedures: Schedule Stress Myoview     ( HOLD Metoprolol 24 hrs before )    Follow-Up: At [facility], you and your health needs are our priority.  As part of our continuing mission to provide you with exceptional heart care, we have created designated Provider Care Teams.  These Care Teams include your primary Cardiologist (physician) and Advanced Practice Providers (APPs -  Physician Assistants and Nurse Practitioners) who all work together to provide you with the care you need, when you need it.  We recommend signing up for the patient portal called "MyChart".  Sign up information is provided on this After Visit Summary.  MyChart is used to connect with patients for Virtual Visits (Telemedicine).  Patients are able to view lab/test results, encounter notes, upcoming appointments, etc.  Non-urgent messages can be sent to your provider as well.   To learn more about what you can do with MyChart, go to [email or web address].    Your next appointment: 6 months      The format for your next appointment: Office    Provider:  Dr.Harding

## 2020-03-16 NOTE — Progress Notes (Signed)
Primary Care Provider: Merrilee Seashore, MD Cardiologist: Glenetta Hew, MD Electrophysiologist: None  Clinic Note: Chief Complaint  Patient presents with  . Follow-up    6 months  . Fatigue    Fatigue and exercise intolerance/dyspnea  . Coronary Artery Disease    HPI:    Erik Lane is a 69 y.o. male with a PMH Notable for MV CAD- CABg 2005 (known CTO SVG-OM in 2007 & small inferolateral defect on Myoview), h/o NSVT & symptomatic bradyacardia- s/p PPM along with chronic fatigue who presents for ~6 month f/u.   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 Ischemia vs. Infarct or artifact ? CARDIAC CATH 2007 => Occluded SVG-OM1 & patent LIMA-LAD, SeqSVG-OM2-OM3.   MYOVIEW Dec 2019: Low Normal EF 51%. Small anterolateral mostly fixed defect - c/w 2016, 2018 & 2019.  Erik Lane was last seen on September 23, 2019: Noted similar issues of that he is always had including fatigue and having to breathe harder with activity.  Him to stop to catch his breath.  No chest pain or pressure.  He was not as active as he had been since retiring.  Previously but walk about 3 miles a day during shifts, but no longer.  Stable right calf pain-previously evaluated with no evidence of PAD.  Re-initiated statin (rosuvastatin 40 mg daily, he had no change in fatigue pain off statin).  Recent Hospitalizations: None  Reviewed  CV studies:    The following studies were reviewed today: (if available, images/films reviewed: From Epic Chart or Care Everywhere) . No new studies   Interval History:   Erik Lane returns today pretty much with the same type of complaints including just having low energy level.  He still does not walk very much.  Again reiterates the fact that he used to walk 3 miles a day but unable to do that now.  I do not know if it is much that he is not trying or simply lacks  the energy to do so. He denies any chest pain or pressure with rest or exertion.  He does have exertional dyspnea associated with fatigue.  He also has occasional orthostatic dizziness.  Otherwise he denies any real heart failure symptoms.  CV Review of Symptoms (Summary) Cardiovascular ROS: positive for - dyspnea on exertion and Sinus fatigue, some orthostatic dizziness negative for - chest pain, edema, irregular heartbeat, orthopnea, palpitations, paroxysmal nocturnal dyspnea, rapid heart rate, shortness of breath or Syncope/near syncope, TIA/amaurosis fugax, claudication  The patien does not have symptoms concerning for COVID-19 infection (fever, chills, cough, or new shortness of breath).  --   The patient is practicing social distancing & Masking.   REVIEWED OF SYSTEMS   Review of Systems  Constitutional: Positive for malaise/fatigue (has to stop after ~ 10 min or so.  Cannot "keep up" ).  HENT: Negative for nosebleeds.   Respiratory: Negative for shortness of breath.   Gastrointestinal: Negative for blood in stool and melena.  Genitourinary: Negative for hematuria.  Musculoskeletal: Positive for myalgias (off & on R calf cramp.). Negative for joint pain.  Neurological: Positive for weakness (Generalized). Negative for dizziness, tremors and focal weakness.  Psychiatric/Behavioral: Negative for memory loss. The patient is not nervous/anxious and does not have insomnia.     I have reviewed and (if needed) personally updated the patient's problem list, medications, allergies, past medical and surgical history, social and family history.  PAST MEDICAL HISTORY   Past Medical History:  Diagnosis Date  . Abnormal nuclear stress test March 2007; March 2016   for DOT physical/CDL license: Inferolateral ischemia - Considered False-Positive, but cath showed 100% SVG-OM occlusion; b.medium size, moderate intensity partially reversible anterolateral defect c/w prior MI & per-infarct  ischemia, normal wall motion& EF - considered c/w anatomy.  . CAD (coronary artery disease), autologous vein bypass graft February 2007   Occluded SVG-OM1 with patent LIMA-LAD & seq SVG-OM2-3  . CAD in native artery 2005   multivessel CAD, ostial 50-60% LAD, mid LAD 75-80% after D1; OM 2 with 50-60% followed by 60% after OM1. RCA small vessel. --> CABG referral  . COVID-19 virus infection 06/2019   Had some respiratory symptoms, but mild case.  Marland Kitchen Dyslipidemia, goal LDL below 70    on statin  . Essential hypertension   . Hernia of abdominal cavity   . Mild mitral regurgitation by prior echocardiogram 04/2013   Echo: EF 55-60%, G1 DD, mild MR  . Paroxysmal atrial fibrillation (HCC)    no recent documentation, although frequent mode switches noted but less than 2 minutes interval.  . S/P CABG x 4 2005   LIMA-LAD, SVG-OM1, seq SVG-OM 2-OM-3  . Status post cardiac pacemaker procedure 1996, 2003; 10-2013   Medtronic Adapta - replaced 10/2013  . Symptomatic sinus bradycardia 1996   status post pacemaker placement, exchanged in 2003 (Medtronic Sigma, in DDDR)   Immunization History  Administered Date(s) Administered  . Influenza, High Dose Seasonal PF 03/17/2017  . PFIZER SARS-COV-2 Vaccination 11/01/2019, 11/22/2019    PAST SURGICAL HISTORY   Past Surgical History:  Procedure Laterality Date  . APPENDECTOMY    . CARDIAC CATHETERIZATION  01/17/2004   multivessel disease with osital 50-60% LAD, mid 70-80% after first diagonal, Cfx and OM2 had 50-60% lesion, 60% lesion after OM1 (Dr. Rockne Menghini)  . CARDIAC CATHETERIZATION  09/16/2005   SVG to OM1 occluded, other grafts patent, patent RCA, normal LV systolic function (Dr. Loni Muse. Little)  . CORONARY ARTERY BYPASS GRAFT  01/24/2004   LIMA-LAD, SVG-OM1, SVG-OM2/OM3 - Dr. Prescott Gum  . GENERATOR REMOVAL  10/14/2013   Procedure: GENERATOR REMOVAL;  Surgeon: Sanda Klein, MD;  Location: [facility];  Service: Cardiovascular;;  . HERNIA REPAIR    . NM  MYOCAR PERF WALL MOTION  08/2005   mod inferolateral ischemia seen at apex and mid ventricular level, EF 62%  . NM [facility] PERF WALL MOTION  August 2017, December 2019   a) LOW RISK. EF 53%. Post CABG septal wall hypokinesis. There is a small sized mild severity defect in the mid anterolateral wall with no ischemia. Likely related to prior infarct (occluded SVG-OM1). Consistent with prior study.;; b) EF 50 to 54%.  Small area of inferolateral ischemia and basal level.  Report as ischemia -more c/w infarction.  Read as intermediate but interpreted as low RIK  . [place] ; 2003; 10-14-13   symptomatic bradycardia. generator change 2003 (Medtronic Sigma) DDDR; left sided device explanted 10-14-13 with new pacing system implatned on right side - MDT ADDRL1 by Dr Sallyanne Kuster  . TRANSTHORACIC ECHOCARDIOGRAM  1996; 04/2013   normal LV function, mild LA dilatation;; b) 10/'14 - EF 55-60%, Gr 1 DD, mild LA dilation, Mild MR    MEDICATIONS/ALLERGIES   Current Meds  Medication Sig  . aspirin EC 81 MG tablet Take 1 tablet (81 mg total) by mouth daily.  Marland Kitchen losartan (COZAAR) 25 MG tablet Take 25  mg by mouth daily.  . metFORMIN (GLUCOPHAGE-XR) 500 MG 24 hr tablet Take 1,000 mg by mouth 2 (two) times daily.  . metoprolol tartrate (LOPRESSOR) 25 MG tablet Take 1 tablet (25 mg total) by mouth 2 (two) times daily.  Marland Kitchen NITROSTAT 0.4 MG SL tablet Place 1 tablet under the tongue every 15 (fifteen) minutes as needed.  Marland Kitchen OZEMPIC, 0.25 OR 0.5 MG/DOSE, 2 MG/1.5ML SOPN Inject 0.5 mg into the skin once a week.   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    OBJCTIVE -PE, EKG, labs   Wt Readings from Last 3 Encounters:  03/16/20 213 lb 3.2 oz (96.7 kg)  09/23/19 205 lb (93 kg)  08/08/19 211 lb (95.7 kg)    Physical Exam: BP 104/72   Pulse 61   Ht 6\' 1"  (1.854 m)   Wt 213 lb 3.2 oz (96.7 kg)   SpO2 98%   BMI  28.13 kg/m  Physical Exam Vitals reviewed.  Constitutional:      General: He is not in acute distress.    Appearance: Normal appearance. He is well-developed. He is not ill-appearing or toxic-appearing.  HENT:     Head: Normocephalic and atraumatic.  Neck:     Vascular: No JVD.     Comments: No JVD or HJR Cardiovascular:     Rate and Rhythm: Normal rate and regular rhythm.  No extrasystoles are present.    Chest Wall: PMI is not displaced (She is older).     Pulses: Normal pulses. No decreased pulses.     Heart sounds: Normal heart sounds and S1 normal. Heart sounds not distant. No murmur heard.  No friction rub. No gallop.      Comments: Split s2; no ectopy. Pulmonary:     Effort: Pulmonary effort is normal. No respiratory distress.     Breath sounds: Normal breath sounds. No wheezing or rales.  Musculoskeletal:        General: No swelling. Normal range of motion.     Cervical back: Normal range of motion and neck supple.  Neurological:     General: No focal deficit present.     Mental Status: He is alert and oriented to person, place, and time.  Psychiatric:        Behavior: Behavior normal.        Thought Content: Thought content normal.        Judgment: Judgment normal.     Comments: As usual, mood seems a little bit flat and down.     Adult ECG Report N/A  Recent Labs: Should be due for lab follow-up in December  Dec 2020 -> TC 150, HDL 36,LDL (not reported), TG 185.   A1c 11.1. Lab Results  Component Value Date   CHOL 154 07/28/2017   HDL 43 07/28/2017   LDLCALC 88 07/28/2017   TRIG 116 07/28/2017   CHOLHDL 3.6 07/28/2017    No results found for: TSH  ASSESSMENT/PLAN   Zykee continues to have exertional dyspnea and fatigue.  Because of his very unusual symptoms back of the time of his initial diagnosis with CAD, intermittent ischemic evaluation is warranted just to ensure that he is continued to have patency of his grafts.  Problem List Items Addressed  This Visit    Coronary artery disease involving native coronary artery of native heart without angina pectoris (Chronic)    Distant history of CABG with known  1 out of 4 vein graft occluded.  He is back now on aspirin, statin and beta-blocker along with ARB.  Planning Lexiscan Myoview to assess for ischemia given his persistent exertional dyspnea.      Relevant Orders   EKG 12-Lead (Completed)   Myocardial Perfusion Imaging   S/P CABG x 4 : LIMA-LAD, SVG-OM1 (known to be occluded), seq SVG-OM 2-OM 3 (Chronic)   Relevant Orders   EKG 12-Lead (Completed)   Myocardial Perfusion Imaging   Excessive postexertional fatigue - Primary (Chronic)    This seems to be his chronic issue and I cannot think of any other reason for it to be.  We have basely taken him off most medications that would cause of this symptom.  The beta-blocker should not cause that effect with him having a pacemaker in place.  We have tried a statin holiday which did not have any effect. I sent this point, we will reevaluate for any ischemic etiology.  Plan-Myoview      Essential hypertension (Chronic)   Relevant Orders   EKG 12-Lead (Completed)   Myocardial Perfusion Imaging   Dyslipidemia, goal LDL below 70 (Chronic)    Back on rosuvastatin now.  Should be due for follow-up labs in December.  If not checked prior to 20-month follow-up, we can reassess.      Relevant Orders   EKG 12-Lead (Completed)   Myocardial Perfusion Imaging   symptomatic bradycardia -- status post pacemaker (Chronic)    Atrial paced-ventricular sensed.      NSVT (nonsustained ventricular tachycardia) (HCC) (Chronic)    He is now on beta-blocker but had a nonischemic Myoview.  No report of further episodes.  Reevaluating for ischemia on Myoview.      Relevant Orders   EKG 12-Lead (Completed)   Myocardial Perfusion Imaging   SSS (sick sinus syndrome) (HCC)   Relevant Orders   EKG 12-Lead (Completed)   Myocardial Perfusion Imaging     History of atrial flutter    Now appears to be atrial paced and ventricular sensed with a RBB pattern      DOE (dyspnea on exertion)    He still complains of exertional dyspnea.  Its been 2 years since we have done the last ischemic evaluation on him.  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  We talked about options and how to go forward and I think he would still like to have some closure.  At this point I think it is time to order another stress test just to exclude any new ischemic lesions..  We will schedule as a stress Myoview, but will likely be converted to [facility] because of need for Covid testing and wearing mask with exercise which defeats the purpose.      Relevant Orders   EKG 12-Lead (Completed)   Myocardial Perfusion Imaging      COVID-19 Education: The signs and symptoms of COVID-19 were discussed with the patient and how to seek care for testing (follow up with PCP or arrange E-visit).   The importance of social distancing was discussed today.  I spent a total of 68minutes with the patient in direct patient consultation.  Additional time spent with chart review  / charting (studies, outside notes, etc): 9 Total Time: 25min   Current medicines are reviewed at length with the patient today.  (+/- concerns) has been started on Ozempic.  No longer taking.  Is currently now on glimepiride..   Patient Instructions /  Medication Changes & Studies & Tests Ordered   Patient Instructions  Medication Instructions:  Continue same medications *If you need a refill on your cardiac medications before your next appointment, please call your pharmacy*   Lab Work: None ordered   Testing/Procedures: Schedule Stress Myoview     ( HOLD Metoprolol 24 hrs before )    Follow-Up: At [facility], you and your health needs are our priority.  As part of our continuing mission to provide you with exceptional heart care, we have created  designated Provider Care Teams.  These Care Teams include your primary Cardiologist (physician) and Advanced Practice Providers (APPs -  Physician Assistants and Nurse Practitioners) who all work together to provide you with the care you need, when you need it.  We recommend signing up for the patient portal called "MyChart".  Sign up information is provided on this After Visit Summary.  MyChart is used to connect with patients for Virtual Visits (Telemedicine).  Patients are able to view lab/test results, encounter notes, upcoming appointments, etc.  Non-urgent messages can be sent to your provider as well.   To learn more about what you can do with MyChart, go to [email or web address].    Your next appointment: 6 months      The format for your next appointment: Office    Provider:  Dr.Sheryle Vice  Studies Ordered:   Orders Placed This Encounter  Procedures  . Myocardial Perfusion Imaging  . EKG 12-Lead     Glenetta Hew, M.D., M.S. Interventional Cardiologist   Pager # [phone] Phone # [phone] [address] [place], [place] [address]   Thank you for choosing Heartcare at [facility]!!

## 2020-03-20 ENCOUNTER — Encounter: Payer: Self-pay | Admitting: Cardiology

## 2020-03-20 NOTE — Assessment & Plan Note (Signed)
Atrial paced-ventricular sensed.

## 2020-03-20 NOTE — Assessment & Plan Note (Signed)
Distant history of CABG with known 1 out of 4 vein graft occluded.  He is back now on aspirin, statin and beta-blocker along with ARB.  Planning Lexiscan Myoview to assess for ischemia given his persistent exertional dyspnea.

## 2020-03-20 NOTE — Assessment & Plan Note (Addendum)
This seems to be his chronic issue and I cannot think of any other reason for it to be.  We have basely taken him off most medications that would cause of this symptom.  The beta-blocker should not cause that effect with him having a pacemaker in place.  We have tried a statin holiday which did not have any effect. I sent this point, we will reevaluate for any ischemic etiology.  Plan-Myoview

## 2020-03-20 NOTE — Assessment & Plan Note (Signed)
Now appears to be atrial paced and ventricular sensed with a RBB pattern

## 2020-03-20 NOTE — Assessment & Plan Note (Signed)
Back on rosuvastatin now.  Should be due for follow-up labs in December.  If not checked prior to 78-month follow-up, we can reassess.

## 2020-03-20 NOTE — Assessment & Plan Note (Signed)
He is now on beta-blocker but had a nonischemic Myoview.  No report of further episodes.  Reevaluating for ischemia on Myoview.

## 2020-03-20 NOTE — Assessment & Plan Note (Signed)
He still complains of exertional dyspnea.  Its been 2 years since we have done the last ischemic evaluation on him.  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  We talked about options and how to go forward and I think he would still like to have some closure.  At this point I think it is time to order another stress test just to exclude any new ischemic lesions..  We will schedule as a stress Myoview, but will likely be converted to [facility] because of need for Covid testing and wearing mask with exercise which defeats the purpose.

## 2020-03-21 ENCOUNTER — Telehealth (HOSPITAL_COMMUNITY): Payer: Self-pay

## 2020-03-21 NOTE — Telephone Encounter (Signed)
Encounter complete. 

## 2020-03-22 ENCOUNTER — Other Ambulatory Visit: Payer: Self-pay | Admitting: *Deleted

## 2020-03-22 DIAGNOSIS — I10 Essential (primary) hypertension: Secondary | ICD-10-CM

## 2020-03-22 DIAGNOSIS — I495 Sick sinus syndrome: Secondary | ICD-10-CM

## 2020-03-22 DIAGNOSIS — E785 Hyperlipidemia, unspecified: Secondary | ICD-10-CM

## 2020-03-22 DIAGNOSIS — I2510 Atherosclerotic heart disease of native coronary artery without angina pectoris: Secondary | ICD-10-CM

## 2020-03-22 DIAGNOSIS — I4729 Other ventricular tachycardia: Secondary | ICD-10-CM

## 2020-03-22 DIAGNOSIS — Z951 Presence of aortocoronary bypass graft: Secondary | ICD-10-CM

## 2020-03-22 DIAGNOSIS — R0609 Other forms of dyspnea: Secondary | ICD-10-CM

## 2020-03-22 NOTE — Progress Notes (Signed)
Test has been changed per Dr Ellyn Hack

## 2020-03-23 ENCOUNTER — Other Ambulatory Visit: Payer: Self-pay

## 2020-03-23 ENCOUNTER — Ambulatory Visit (HOSPITAL_COMMUNITY)
Admission: RE | Admit: 2020-03-23 | Discharge: 2020-03-23 | Disposition: A | Discharge status: Home / Self Care | Payer: PPO | Source: Ambulatory Visit | Attending: Cardiology | Admitting: Cardiology

## 2020-03-23 DIAGNOSIS — R0600 Dyspnea, unspecified: Secondary | ICD-10-CM

## 2020-03-23 DIAGNOSIS — I495 Sick sinus syndrome: Secondary | ICD-10-CM | POA: Diagnosis not present

## 2020-03-23 DIAGNOSIS — I472 Ventricular tachycardia: Secondary | ICD-10-CM

## 2020-03-23 DIAGNOSIS — R0609 Other forms of dyspnea: Secondary | ICD-10-CM

## 2020-03-23 DIAGNOSIS — I10 Essential (primary) hypertension: Secondary | ICD-10-CM | POA: Diagnosis not present

## 2020-03-23 DIAGNOSIS — I4729 Other ventricular tachycardia: Secondary | ICD-10-CM

## 2020-03-23 DIAGNOSIS — I2510 Atherosclerotic heart disease of native coronary artery without angina pectoris: Secondary | ICD-10-CM | POA: Diagnosis not present

## 2020-03-23 DIAGNOSIS — E785 Hyperlipidemia, unspecified: Secondary | ICD-10-CM | POA: Diagnosis not present

## 2020-03-23 DIAGNOSIS — Z951 Presence of aortocoronary bypass graft: Secondary | ICD-10-CM

## 2020-03-23 LAB — MYOCARDIAL PERFUSION IMAGING
LV dias vol: 88 mL (ref 62–150)
LV sys vol: 45 mL
Peak HR: 65 {beats}/min
Rest HR: 61 {beats}/min
SDS: 2
SRS: 4
SSS: 6
TID: 0.95

## 2020-03-23 MED ORDER — TECHNETIUM TC 99M TETROFOSMIN IV KIT
29.4000 | PACK | Freq: Once | INTRAVENOUS | Status: AC | PRN
  Administered 2020-03-23: 29.4 via INTRAVENOUS
  Filled 2020-03-23: qty 30

## 2020-03-23 MED ORDER — TECHNETIUM TC 99M TETROFOSMIN IV KIT
10.5000 | PACK | Freq: Once | INTRAVENOUS | Status: AC | PRN
  Administered 2020-03-23: 10.5 via INTRAVENOUS
  Filled 2020-03-23: qty 11

## 2020-03-23 MED ORDER — REGADENOSON 0.4 MG/5ML IV SOLN
0.4000 mg | Freq: Once | INTRAVENOUS | Status: AC
  Administered 2020-03-23: 0.4 mg via INTRAVENOUS

## 2020-04-25 ENCOUNTER — Ambulatory Visit (INDEPENDENT_AMBULATORY_CARE_PROVIDER_SITE_OTHER): Payer: PPO

## 2020-04-25 DIAGNOSIS — I495 Sick sinus syndrome: Secondary | ICD-10-CM

## 2020-04-27 LAB — CUP PACEART REMOTE DEVICE CHECK
Battery Impedance: 665 Ohm
Battery Remaining Longevity: 88 mo
Battery Voltage: 2.78 V
Brady Statistic AP VP Percent: 2 %
Brady Statistic AP VS Percent: 93 %
Brady Statistic AS VP Percent: 0 %
Brady Statistic AS VS Percent: 5 %
Date Time Interrogation Session: 20211014132546
Implantable Lead Implant Date: 20150403
Implantable Lead Implant Date: 20150403
Implantable Lead Location: 753859
Implantable Lead Location: 753860
Implantable Lead Model: 5076
Implantable Lead Model: 5076
Implantable Pulse Generator Implant Date: 20150403
Lead Channel Impedance Value: 476 Ohm
Lead Channel Impedance Value: 615 Ohm
Lead Channel Pacing Threshold Amplitude: 0.5 V
Lead Channel Pacing Threshold Amplitude: 0.625 V
Lead Channel Pacing Threshold Pulse Width: 0.4 ms
Lead Channel Pacing Threshold Pulse Width: 0.4 ms
Lead Channel Setting Pacing Amplitude: 1.5 V
Lead Channel Setting Pacing Amplitude: 2 V
Lead Channel Setting Pacing Pulse Width: 0.4 ms
Lead Channel Setting Sensing Sensitivity: 2.8 mV

## 2020-04-30 NOTE — Progress Notes (Signed)
Remote pacemaker transmission.   

## 2020-06-15 DIAGNOSIS — E782 Mixed hyperlipidemia: Secondary | ICD-10-CM | POA: Diagnosis not present

## 2020-06-15 DIAGNOSIS — E1169 Type 2 diabetes mellitus with other specified complication: Secondary | ICD-10-CM | POA: Diagnosis not present

## 2020-06-15 DIAGNOSIS — E1165 Type 2 diabetes mellitus with hyperglycemia: Secondary | ICD-10-CM | POA: Diagnosis not present

## 2020-06-15 DIAGNOSIS — I25118 Atherosclerotic heart disease of native coronary artery with other forms of angina pectoris: Secondary | ICD-10-CM | POA: Diagnosis not present

## 2020-06-22 DIAGNOSIS — I25119 Atherosclerotic heart disease of native coronary artery with unspecified angina pectoris: Secondary | ICD-10-CM | POA: Diagnosis not present

## 2020-06-22 DIAGNOSIS — E782 Mixed hyperlipidemia: Secondary | ICD-10-CM | POA: Diagnosis not present

## 2020-06-22 DIAGNOSIS — I10 Essential (primary) hypertension: Secondary | ICD-10-CM | POA: Diagnosis not present

## 2020-06-22 DIAGNOSIS — K219 Gastro-esophageal reflux disease without esophagitis: Secondary | ICD-10-CM | POA: Diagnosis not present

## 2020-06-22 DIAGNOSIS — E1165 Type 2 diabetes mellitus with hyperglycemia: Secondary | ICD-10-CM | POA: Diagnosis not present

## 2020-06-22 DIAGNOSIS — I25118 Atherosclerotic heart disease of native coronary artery with other forms of angina pectoris: Secondary | ICD-10-CM | POA: Diagnosis not present

## 2020-06-22 DIAGNOSIS — I4892 Unspecified atrial flutter: Secondary | ICD-10-CM | POA: Diagnosis not present

## 2020-07-25 ENCOUNTER — Ambulatory Visit (INDEPENDENT_AMBULATORY_CARE_PROVIDER_SITE_OTHER): Payer: PPO

## 2020-07-25 DIAGNOSIS — I495 Sick sinus syndrome: Secondary | ICD-10-CM | POA: Diagnosis not present

## 2020-07-27 LAB — CUP PACEART REMOTE DEVICE CHECK
Battery Impedance: 716 Ohm
Battery Remaining Longevity: 84 mo
Battery Voltage: 2.79 V
Brady Statistic AP VP Percent: 4 %
Brady Statistic AP VS Percent: 91 %
Brady Statistic AS VP Percent: 0 %
Brady Statistic AS VS Percent: 4 %
Date Time Interrogation Session: 20220113143847
Implantable Lead Implant Date: 20150403
Implantable Lead Implant Date: 20150403
Implantable Lead Location: 753859
Implantable Lead Location: 753860
Implantable Lead Model: 5076
Implantable Lead Model: 5076
Implantable Pulse Generator Implant Date: 20150403
Lead Channel Impedance Value: 471 Ohm
Lead Channel Impedance Value: 648 Ohm
Lead Channel Pacing Threshold Amplitude: 0.5 V
Lead Channel Pacing Threshold Amplitude: 0.625 V
Lead Channel Pacing Threshold Pulse Width: 0.4 ms
Lead Channel Pacing Threshold Pulse Width: 0.4 ms
Lead Channel Setting Pacing Amplitude: 1.5 V
Lead Channel Setting Pacing Amplitude: 2 V
Lead Channel Setting Pacing Pulse Width: 0.4 ms
Lead Channel Setting Sensing Sensitivity: 2.8 mV

## 2020-08-07 NOTE — Progress Notes (Signed)
Remote pacemaker transmission.   

## 2020-08-09 DIAGNOSIS — Z961 Presence of intraocular lens: Secondary | ICD-10-CM | POA: Diagnosis not present

## 2020-08-09 DIAGNOSIS — H524 Presbyopia: Secondary | ICD-10-CM | POA: Diagnosis not present

## 2020-08-09 DIAGNOSIS — E119 Type 2 diabetes mellitus without complications: Secondary | ICD-10-CM | POA: Diagnosis not present

## 2020-09-04 ENCOUNTER — Other Ambulatory Visit: Payer: Self-pay | Admitting: Cardiology

## 2020-09-11 ENCOUNTER — Encounter: Payer: Self-pay | Admitting: Cardiology

## 2020-09-11 ENCOUNTER — Ambulatory Visit: Payer: PPO | Admitting: Cardiology

## 2020-09-11 ENCOUNTER — Other Ambulatory Visit: Payer: Self-pay

## 2020-09-11 VITALS — BP 164/84 | HR 64 | Ht 73.0 in | Wt 217.2 lb

## 2020-09-11 DIAGNOSIS — R5383 Other fatigue: Secondary | ICD-10-CM | POA: Diagnosis not present

## 2020-09-11 DIAGNOSIS — Z950 Presence of cardiac pacemaker: Secondary | ICD-10-CM

## 2020-09-11 DIAGNOSIS — Z951 Presence of aortocoronary bypass graft: Secondary | ICD-10-CM | POA: Diagnosis not present

## 2020-09-11 DIAGNOSIS — R001 Bradycardia, unspecified: Secondary | ICD-10-CM | POA: Diagnosis not present

## 2020-09-11 DIAGNOSIS — I2510 Atherosclerotic heart disease of native coronary artery without angina pectoris: Secondary | ICD-10-CM

## 2020-09-11 DIAGNOSIS — E1169 Type 2 diabetes mellitus with other specified complication: Secondary | ICD-10-CM

## 2020-09-11 DIAGNOSIS — E785 Hyperlipidemia, unspecified: Secondary | ICD-10-CM

## 2020-09-11 DIAGNOSIS — I4892 Unspecified atrial flutter: Secondary | ICD-10-CM

## 2020-09-11 DIAGNOSIS — I10 Essential (primary) hypertension: Secondary | ICD-10-CM | POA: Diagnosis not present

## 2020-09-11 DIAGNOSIS — I25718 Atherosclerosis of autologous vein coronary artery bypass graft(s) with other forms of angina pectoris: Secondary | ICD-10-CM

## 2020-09-11 MED ORDER — LOSARTAN POTASSIUM 50 MG PO TABS: 50.0000 mg | ORAL_TABLET | Freq: Every day | ORAL | 3 refills | Status: DC

## 2020-09-11 MED ORDER — METOPROLOL TARTRATE 25 MG PO TABS: 12.5000 mg | ORAL_TABLET | Freq: Two times a day (BID) | ORAL | 3 refills | Status: DC

## 2020-09-11 NOTE — Progress Notes (Signed)
Primary Care Provider: Merrilee Seashore, MD Cardiologist: Glenetta Hew, MD Electrophysiologist: None  Clinic Note: Chief Complaint  Patient presents with  . Follow-up    6 months -> no real change in symptoms.  . Coronary Artery Disease    Myoview results reviewed  . Fatigue    Exercise intolerance/dyspnea    ===================================  ASSESSMENT/PLAN   Problem List Items Addressed This Visit    Coronary artery disease involving native coronary artery of native heart without angina pectoris - Primary (Chronic)    Distant history of CABG 104 grain grafts occluded.  He has exertional dyspnea that we have evaluated recently with a Myoview stress test which was very similar to previous stress test.  There is partially reversible basal inferolateral anterolateral defect consistent with prior infarct and peri-infarct ischemia.  This is very similar to previous studies, not likely indicating anything more than known occlusion of the SVG-OM1.  If this were to worsen, I would expect that there was an issue with the vein graft to the OM 2 and OM 3.  He is on beta-blocker and ARB as well as aspirin and statin.  With his fatigue, we will reduce metoprolol down to 12-1/2 twice daily as his palpitations have improved, and increase losartan to 50 mg daily.      Relevant Medications   losartan (COZAAR) 50 MG tablet   metoprolol tartrate (LOPRESSOR) 25 MG tablet   S/P CABG x 4 : LIMA-LAD, SVG-OM1 (known to be occluded), seq SVG-OM 2-OM 3 (Chronic)    No significant change in Myoview stress test.  Previously reported infarct present with peri-infarct ischemia.  Read as low risk, but there was no change from 2019..      Status post cardiac pacemaker procedure (Chronic)   Excessive postexertional fatigue (Chronic)    This is still a chronic issue.  Is my view is not all that telling him anything significant.  Heart itself is a medication effect, I am reducing his beta-blocker and  increasing ARB for better afterload reduction given the chance of diastolic dysfunction. Consider changing rate responsiveness of pacemaker.  Also the possibility of simply being deconditioned.       Essential hypertension (Chronic)    Blood pressure is high today.  I will increase his losartan dose to 50 mg while reducing metoprolol.  I have asked that he check his blood pressure at home with blood pressure log.  He tells me his pressures are better at home than they are here.        Relevant Medications   losartan (COZAAR) 50 MG tablet   metoprolol tartrate (LOPRESSOR) 25 MG tablet   Hyperlipidemia associated with type 2 diabetes mellitus ([place]) (Chronic)    Last labs in December showed LDL of 63.  In target range on current dose of rosuvastatin.  He stopped it as a statin holiday to see if it helps his fatigue, but no change. A1c in December was 8.2.  Apparently he did not tolerate Ozempic which is unfortunate, but perhaps he could benefit from SGLT2 inhibitor.  Defer to PCP, however there is clear-cut diabetic and cardiovascular benefit from SGLT2 inhibitors.   Continue to follow-up labs with PCP.      Relevant Medications   losartan (COZAAR) 50 MG tablet   symptomatic bradycardia -- status post pacemaker (Chronic)    He is noticing fatigue, I will reduce his beta-blocker, but this should not really change the rate responsiveness of the pacemaker.    I have  asked that he get scheduled to see Dr. Sallyanne Kuster in person to adjust his pacemaker.  Perhaps increase his responsiveness.  I wonder if he has chronotropic incompetence      Possible paroxysmal atrial flutter (HCC) (Chronic)   Relevant Medications   losartan (COZAAR) 50 MG tablet   metoprolol tartrate (LOPRESSOR) 25 MG tablet   CAD (coronary artery disease), autologous vein bypass graft (Chronic)    Known occlusion of SVG-OM1.  This probably correlates with this, and the abnormal stress test.  By last cath, the other grafts were  open.  Territories no change in size compared to previous study.  This would argue that the vein graft to OM 2-OM 3 is still patent.      Relevant Medications   losartan (COZAAR) 50 MG tablet   metoprolol tartrate (LOPRESSOR) 25 MG tablet     ===================================  HPI:    Erik Lane is a 70 y.o. male with a PMH notable for MV CAD- CABg 2005 (known CTO SVG-OM in 2007 & small inferolateral defect on Myoview), h/o NSVT & symptomatic bradyacardia- s/p PPM along with chronic fatigue who presents today for 6 month f/u.  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 Ischemia vs. Infarct or artifact ? CARDIAC CATH 2007 => Occluded SVG-OM1 & patent LIMA-LAD, SeqSVG-OM2-OM3.   MYOVIEW Dec 2019: Low Normal EF 51%. Small anterolateral mostly fixed defect - c/w 2016, 2018 & 2019.  Erik Lane was last seen on 03/16/2020: Again major complaint was having low energy level.  Still not walking very far.  He is to walk 3 miles a day but not able to do that.  Lacks energy.  Not really noting chest discomfort.  Indicated that he "cannot keep up ".  Has to stop every 10 minutes or so.  Off-and-on myalgias/calf cramping.  Tremor notably improved. - Myoview ordered due to exercise-related dyspnea.  Recent Hospitalizations: None  Reviewed  CV studies:    The following studies were reviewed today: (if available, images/films reviewed: From Epic Chart or Care Everywhere) . Myoview 03/23/2020: EF 45-54% (49%).  Postop septal wall changes.  No ST segment abnormality during stress.  Defect 1: There is a medium defect of moderate severity present in the basal anterolateral and mid anterolateral location. Partially reversible. => Consistent with prior infarct with mild peri-infarct ischemia.  Intermediate risk stress test with partially reversible basal to mid anterior lateral wall perfusion defect.  When compared to prior nuclear stress test from 2019, there is no significant change.  Interval History:   Erik Lane returns today for ongoing care.  He really has not had any active chest discomfort symptoms.  His biggest concern is fatigue.  He does have some exertional dyspnea and occasional subtle chest discomfort similar to what was seen prior to his stress test.  He says that he is still frustrated by not being to walk 3 miles a day.  Just not able to keep up anymore.  He says he still does exercise about 2 3 days a week  CV Review of Symptoms (Summary): positive for - chest pain, dyspnea on exertion and Fatigue, exercise intolerance.  Not true chest pain, just a nagging discomfort. negative for - edema, irregular heartbeat, orthopnea, palpitations, paroxysmal nocturnal dyspnea, rapid heart rate, shortness of breath or Lightheadedness, dizziness, wooziness, syncope/near syncope, TIA/amaurosis fugax, claudication  The patient does not have symptoms concerning for COVID-19 infection (fever, chills, cough, or  new shortness of breath).   REVIEWED OF SYSTEMS   ROS  Constitutional: Positive for malaise/fatigue (has to stop after ~ 10 min or so.  Cannot "keep up" ).  HENT: Negative for nosebleeds.   Respiratory: Negative for shortness of breath.   Gastrointestinal: Negative for blood in stool and melena.  Genitourinary: Negative for hematuria.  Musculoskeletal: Positive for myalgias (off & on R calf cramp.). Negative for joint pain.  Neurological: Positive for weakness (Generalized).    I have reviewed and (if needed) personally updated the patient's problem list, medications, allergies, past medical and surgical history, social and family history.   PAST MEDICAL HISTORY   Past Medical History:  Diagnosis Date  . Abnormal nuclear stress test March 2007; March 2016   for DOT physical/CDL license: Inferolateral ischemia - Considered False-Positive, but cath showed 100% SVG-OM  occlusion; b.medium size, moderate intensity partially reversible anterolateral defect c/w prior MI & per-infarct ischemia, normal wall motion& EF - considered c/w anatomy.  . CAD (coronary artery disease), autologous vein bypass graft February 2007   Occluded SVG-OM1 with patent LIMA-LAD & seq SVG-OM2-3  . CAD in native artery 2005   multivessel CAD, ostial 50-60% LAD, mid LAD 75-80% after D1; OM 2 with 50-60% followed by 60% after OM1. RCA small vessel. --> CABG referral  . COVID-19 virus infection 06/2019   Had some respiratory symptoms, but mild case.  Marland Kitchen Dyslipidemia, goal LDL below 70    on statin  . Essential hypertension   . Hernia of abdominal cavity   . Mild mitral regurgitation by prior echocardiogram 04/2013   Echo: EF 55-60%, G1 DD, mild MR  . Paroxysmal atrial fibrillation (HCC)    no recent documentation, although frequent mode switches noted but less than 2 minutes interval.  . S/P CABG x 4 2005   LIMA-LAD, SVG-OM1, seq SVG-OM 2-OM-3  . Status post cardiac pacemaker procedure 1996, 2003; 10-2013   Medtronic Adapta - replaced 10/2013  . Symptomatic sinus bradycardia 1996   status post pacemaker placement, exchanged in 2003 (Medtronic Sigma, in DDDR)    PAST SURGICAL HISTORY   Past Surgical History:  Procedure Laterality Date  . APPENDECTOMY    . CARDIAC CATHETERIZATION  01/17/2004   multivessel disease with osital 50-60% LAD, mid 70-80% after first diagonal, Cfx and OM2 had 50-60% lesion, 60% lesion after OM1 (Dr. Rockne Menghini)  . CARDIAC CATHETERIZATION  09/16/2005   SVG to OM1 occluded, other grafts patent, patent RCA, normal LV systolic function (Dr. Loni Muse. Little)  . CORONARY ARTERY BYPASS GRAFT  01/24/2004   LIMA-LAD, SVG-OM1, SVG-OM2/OM3 - Dr. Prescott Gum  . GENERATOR REMOVAL  10/14/2013   Procedure: GENERATOR REMOVAL;  Surgeon: Sanda Klein, MD;  Location: [facility] CATH [facility];  Service: Cardiovascular;;  . HERNIA REPAIR    . NM MYOCAR PERF WALL MOTION  08/2005   mod inferolateral  ischemia seen at apex and mid ventricular level, EF 62%  . NM [facility] PERF WALL MOTION  August 2017, December 2019   a) LOW RISK. EF 53%. Post CABG septal wall hypokinesis. There is a small sized mild severity defect in the mid anterolateral wall with no ischemia. Likely related to prior infarct (occluded SVG-OM1). Consistent with prior study.;; b) EF 50 to 54%.  Small area of inferolateral ischemia and basal level.  Report as ischemia -more c/w infarction.  Read as intermediate but interpreted as low RIK  . [place] ; 2003; 10-14-13   symptomatic bradycardia. generator change 2003 (Medtronic  Sigma) DDDR; left sided device explanted 10-14-13 with new pacing system implatned on right side - MDT ADDRL1 by Dr Sallyanne Kuster  . TRANSTHORACIC ECHOCARDIOGRAM  1996; 04/2013   normal LV function, mild LA dilatation;; b) 10/'14 - EF 55-60%, Gr 1 DD, mild LA dilation, Mild MR    Immunization History  Administered Date(s) Administered  . Influenza, High Dose Seasonal PF 03/17/2017  . PFIZER(Purple Top)SARS-COV-2 Vaccination 11/01/2019, 11/22/2019, 05/23/2020    MEDICATIONS/ALLERGIES   Current Meds  Medication Sig  . aspirin EC 81 MG tablet Take 1 tablet (81 mg total) by mouth daily.  Marland Kitchen glimepiride (AMARYL) 2 MG tablet Take 2 mg by mouth every morning. Pt takes 1.5 tablets in morning  . Lancets (ONETOUCH DELICA PLUS [id number]) MISC TEST EVERY DAY AS DIRECTED  . losartan (COZAAR) 50 MG tablet Take 1 tablet (50 mg total) by mouth daily.  . metFORMIN (GLUCOPHAGE-XR) 500 MG 24 hr tablet Take 1,000 mg by mouth 2 (two) times daily.  . metoprolol tartrate (LOPRESSOR) 25 MG tablet Take 0.5 tablets (12.5 mg total) by mouth 2 (two) times daily.  Marland Kitchen NITROSTAT 0.4 MG SL tablet Place 1 tablet under the tongue every 15 (fifteen) minutes as needed.  Glory Rosebush VERIO test strip daily.  . rosuvastatin (CRESTOR) 40 MG tablet TAKE 1 TABLET BY MOUTH EVERY DAY  . scopolamine (TRANSDERM-SCOP) 1 MG/3DAYS 1 PATCH TO  SKIN BEHIND THE EAR AS NEEDED TRANSDERMAL 2 DAYS  . [DISCONTINUED] losartan (COZAAR) 25 MG tablet Take 25 mg by mouth daily.  . [DISCONTINUED] metoprolol tartrate (LOPRESSOR) 25 MG tablet Take 1 tablet (25 mg total) by mouth 2 (two) times daily.    Allergies  Allergen Reactions  . Ozempic (0.25 Or 0.5 Mg-Dose) [Semaglutide(0.25 Or 0.5mg -Dos)] Nausea And Vomiting    SOCIAL HISTORY/FAMILY HISTORY   Reviewed in Epic:  Pertinent findings:  Social History   Tobacco Use  . Smoking status: Never Smoker  . Smokeless tobacco: Never Used  Vaping Use  . Vaping Use: Never used  Substance Use Topics  . Alcohol use: No  . Drug use: No   Social History   Social History Narrative   He is a father of 2, grandfather 29. He does chew tobacco on daily basis but quit smoking 12 years ago. Never drinks a fall. 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    OBJCTIVE -PE, EKG, labs   Wt Readings from Last 3 Encounters:  10/04/20 215 lb (97.5 kg)  09/11/20 217 lb 3.2 oz (98.5 kg)  03/23/20 213 lb (96.6 kg)   Home blood pressure recording was 134/80 mmHg. Blood sugar range from 10 6-1 32.  Physical Exam: BP (!) 164/84   Pulse 64   Ht 6\' 1"  (1.854 m)   Wt 217 lb 3.2 oz (98.5 kg)   SpO2 98%   BMI 28.66 kg/m  Physical Exam Vitals reviewed.  Constitutional:      General: He is not in acute distress.    Appearance: Normal appearance. He is normal weight. He is not ill-appearing or toxic-appearing.  HENT:     Head: Normocephalic and atraumatic.  Neck:     Vascular: No carotid bruit, hepatojugular reflux or JVD.  Cardiovascular:     Rate and Rhythm: Normal rate and regular rhythm.  No extrasystoles are present.    Chest Wall: PMI is not displaced.     Pulses: Normal pulses.     Heart sounds:  Normal heart sounds. No murmur heard. No friction rub. No gallop.      Comments: Normal S1, widely split S2 Pulmonary:      Effort: Pulmonary effort is normal. No respiratory distress.     Breath sounds: Normal breath sounds.  Chest:     Chest wall: No tenderness.  Musculoskeletal:        General: No swelling. Normal range of motion.     Cervical back: Normal range of motion and neck supple.  Skin:    General: Skin is warm and dry.  Neurological:     General: No focal deficit present.     Mental Status: He is alert and oriented to person, place, and time.  Psychiatric:        Behavior: Behavior normal.        Thought Content: Thought content normal.        Judgment: Judgment normal.     Comments: Somewhat down/depressed mood     Adult ECG Report N/a  Recent Labs:  By PCP  06/15/2020: TC 122, TG 133, HDL 35, LDL 63,, A1c 8.2; Cr 0.89   ==================================================  COVID-19 Education: The signs and symptoms of COVID-19 were discussed with the patient and how to seek care for testing (follow up with PCP or arrange E-visit).   The importance of social distancing and COVID-19 vaccination was discussed today. The patient is practicing social distancing & Masking.    I spent a total of 28 minutes with the patient spent in direct patient consultation.  Additional time spent with chart review  / charting (studies, outside notes, etc): 16 min Total Time: 44 min  Current medicines are reviewed at length with the patient today.  (+/- concerns) n/a  This visit occurred during the SARS-CoV-2 public health emergency.  Safety protocols were in place, including screening questions prior to the visit, additional usage of staff PPE, and extensive cleaning of exam room while observing appropriate contact time as indicated for disinfecting solutions.  Notice: This dictation was prepared with Dragon dictation along with smaller phrase technology. Any transcriptional errors that result from this process are unintentional and may not be corrected upon review.  Patient Instructions / Medication  Changes & Studies & Tests Ordered   Patient Instructions  Medication Instructions:   decrease an take  Metoprolol tartrate 12.5 mg  ( 1/2 tablet )  Twice a day    increase an take Losartan 50 mg  Day    *If you need a refill on your cardiac medications before your next appointment, please call your pharmacy*   Lab Work:  Not needed   If you have labs (blood work) drawn today and your tests are completely normal, you will receive your results only by: Marland Kitchen MyChart Message (if you have MyChart) OR . A paper copy in the mail If you have any lab test that is abnormal or we need to change your treatment, we will call you to review the results.   Testing/Procedures:  Not needed  Follow-Up: At [facility], you and your health needs are our priority.  As part of our continuing mission to provide you with exceptional heart care, we have created designated Provider Care Teams.  These Care Teams include your primary Cardiologist (physician) and Advanced Practice Providers (APPs -  Physician Assistants and Nurse Practitioners) who all work together to provide you with the care you need, when you need it.  We recommend signing up for the patient portal called "MyChart".  Sign  up information is provided on this After Visit Summary.  MyChart is used to connect with patients for Virtual Visits (Telemedicine).  Patients are able to view lab/test results, encounter notes, upcoming appointments, etc.  Non-urgent messages can be sent to your provider as well.   To learn more about what you can do with MyChart, go to [email or web address].    Your next appointment:   6 month(s) Sept 2022  The format for your next appointment:   In Person  Provider:   Glenetta Hew, MD   Other Instructions Purchase a blood pressure cuff .   Range for blood pressure readings are  120- 150 /80   HOW TO TAKE YOUR BLOOD PRESSURE:  Rest 5 minutes before taking your blood pressure.  Don't smoke or drink  caffeinated beverages for at least 30 minutes before.  Take your blood pressure before (not after) you eat.  Sit comfortably with your back supported and both feet on the floor (don't cross your legs).  Elevate your arm to heart level on a table or a desk.  Use the proper sized cuff. It should fit smoothly and snugly around your bare upper arm. There should be enough room to slip a fingertip under the cuff. The bottom edge of the cuff should be 1 inch above the crease of the elbow.  Ideally, take 3 measurements at one sitting and record the average.     Studies Ordered:   No orders of the defined types were placed in this encounter.    Glenetta Hew, M.D., M.S. Interventional Cardiologist   Pager # [phone] Phone # [phone] [address]. [place], [place] [address]   Thank you for choosing Heartcare at [facility]!!

## 2020-09-11 NOTE — Patient Instructions (Addendum)
Medication Instructions:   decrease an take  Metoprolol tartrate 12.5 mg  ( 1/2 tablet )  Twice a day    increase an take Losartan 50 mg  Day    *If you need a refill on your cardiac medications before your next appointment, please call your pharmacy*   Lab Work:  Not needed   If you have labs (blood work) drawn today and your tests are completely normal, you will receive your results only by: Marland Kitchen MyChart Message (if you have MyChart) OR . A paper copy in the mail If you have any lab test that is abnormal or we need to change your treatment, we will call you to review the results.   Testing/Procedures:  Not needed  Follow-Up: At [facility], you and your health needs are our priority.  As part of our continuing mission to provide you with exceptional heart care, we have created designated Provider Care Teams.  These Care Teams include your primary Cardiologist (physician) and Advanced Practice Providers (APPs -  Physician Assistants and Nurse Practitioners) who all work together to provide you with the care you need, when you need it.  We recommend signing up for the patient portal called "MyChart".  Sign up information is provided on this After Visit Summary.  MyChart is used to connect with patients for Virtual Visits (Telemedicine).  Patients are able to view lab/test results, encounter notes, upcoming appointments, etc.  Non-urgent messages can be sent to your provider as well.   To learn more about what you can do with MyChart, go to [email or web address].    Your next appointment:   6 month(s) Sept 2022  The format for your next appointment:   In Person  Provider:   Glenetta Hew, MD   Other Instructions Purchase a blood pressure cuff .   Range for blood pressure readings are  120- 150 /80   HOW TO TAKE YOUR BLOOD PRESSURE:  Rest 5 minutes before taking your blood pressure.  Don't smoke or drink caffeinated beverages for at least 30 minutes before.  Take  your blood pressure before (not after) you eat.  Sit comfortably with your back supported and both feet on the floor (don't cross your legs).  Elevate your arm to heart level on a table or a desk.  Use the proper sized cuff. It should fit smoothly and snugly around your bare upper arm. There should be enough room to slip a fingertip under the cuff. The bottom edge of the cuff should be 1 inch above the crease of the elbow.  Ideally, take 3 measurements at one sitting and record the average.

## 2020-09-21 DIAGNOSIS — E1165 Type 2 diabetes mellitus with hyperglycemia: Secondary | ICD-10-CM | POA: Diagnosis not present

## 2020-09-28 DIAGNOSIS — D492 Neoplasm of unspecified behavior of bone, soft tissue, and skin: Secondary | ICD-10-CM | POA: Diagnosis not present

## 2020-09-28 DIAGNOSIS — I4892 Unspecified atrial flutter: Secondary | ICD-10-CM | POA: Diagnosis not present

## 2020-09-28 DIAGNOSIS — E1121 Type 2 diabetes mellitus with diabetic nephropathy: Secondary | ICD-10-CM | POA: Diagnosis not present

## 2020-09-28 DIAGNOSIS — E782 Mixed hyperlipidemia: Secondary | ICD-10-CM | POA: Diagnosis not present

## 2020-09-28 DIAGNOSIS — E1159 Type 2 diabetes mellitus with other circulatory complications: Secondary | ICD-10-CM | POA: Diagnosis not present

## 2020-09-28 DIAGNOSIS — I10 Essential (primary) hypertension: Secondary | ICD-10-CM | POA: Diagnosis not present

## 2020-10-04 ENCOUNTER — Encounter: Payer: Self-pay | Admitting: Cardiovascular Disease

## 2020-10-04 ENCOUNTER — Other Ambulatory Visit: Payer: Self-pay

## 2020-10-04 ENCOUNTER — Ambulatory Visit (INDEPENDENT_AMBULATORY_CARE_PROVIDER_SITE_OTHER): Payer: PPO | Admitting: Cardiovascular Disease

## 2020-10-04 VITALS — BP 145/75 | HR 63 | Ht 73.0 in | Wt 215.0 lb

## 2020-10-04 DIAGNOSIS — I4729 Other ventricular tachycardia: Secondary | ICD-10-CM

## 2020-10-04 DIAGNOSIS — I2510 Atherosclerotic heart disease of native coronary artery without angina pectoris: Secondary | ICD-10-CM

## 2020-10-04 DIAGNOSIS — Z950 Presence of cardiac pacemaker: Secondary | ICD-10-CM

## 2020-10-04 DIAGNOSIS — E119 Type 2 diabetes mellitus without complications: Secondary | ICD-10-CM

## 2020-10-04 DIAGNOSIS — E785 Hyperlipidemia, unspecified: Secondary | ICD-10-CM

## 2020-10-04 DIAGNOSIS — I472 Ventricular tachycardia: Secondary | ICD-10-CM

## 2020-10-04 DIAGNOSIS — I10 Essential (primary) hypertension: Secondary | ICD-10-CM

## 2020-10-04 DIAGNOSIS — I495 Sick sinus syndrome: Secondary | ICD-10-CM | POA: Diagnosis not present

## 2020-10-04 NOTE — Patient Instructions (Signed)

## 2020-10-04 NOTE — Progress Notes (Signed)
Cardiology Office Note    Date:  10/04/2020   ID:  Erik Lane, DOB June 13, 1951, MRN [id number]  PCP:  Merrilee Seashore, MD  Cardiologist:  Glenetta Hew, M.D.; Sanda Klein, MD   Chief Complaint  Patient presents with  . Pacemaker Check    History of Present Illness:  Erik Lane is a 70 y.o. male with an extensive history of coronary artery disease and previous bypass surgery, here for pacemaker check. His device is a dual-chamber Medtronic Adapta implanted April 2015 for sinus node dysfunction.  His biggest complaint is of fatigue.  He has some mild limitations to physical activity due to shortness of breath and very subtle chest pressure.  He notices some shortness of breath after walking to the mailbox and back a total distance of about 120 feet with a slight incline.  He has no other limitations with activities of daily living.  He denies any dyspnea at rest, orthopnea, PND, edema, angina at rest, palpitations, dizziness or syncope.  He has a new dual-chamber pacemaker in the right subclavian area and an abandoned old left subclavian pacemaker with unipolar leads.  Interrogation of his device (Medtronic Adapta implanted 2015) still shows about 7 years of remaining longevity.  He has nearly 100% atrial pacing and very rare ventricular pacing (atrial paced 97%, ventricular paced 6%).  There have been no meaningful episodes of atrial mode switch no episodes of high ventricular rate.  Heart rate histogram distribution is fair, may be just slightly blunted.  Glycemic control has been poor with a hemoglobin A1c as high as 11%, but has improved and is now better at 8.2%.  He did not tolerate Ozempic due to a rash.  He is on Metformin and glimepiride (the latter dose recently increased).  His lipid profile shows an LDL of 63 but also a low HDL of 35.  Triglycerides are normal.  He originally received a pacemaker in 1996 (unipolar leads) sinus node dysfunction with intermittent AV  block and had a generator change in 2003.  Unipolar lead "noise" began interfering with device function with numerous episodes of mode switch due to artifact.  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  The old pacemaker was deactivated but left in place and he received an entirely new system in the right subclavian area in 2015 with new bipolar leads.  Ever since the new system was implanted we have not recorded any "atrial flutter" or "atrial fibrillation" as reported on his old unipolar leads.   Past Medical History:  Diagnosis Date  . Abnormal nuclear stress test March 2007; March 2016   for DOT physical/CDL license: Inferolateral ischemia - Considered False-Positive, but cath showed 100% SVG-OM occlusion; b.medium size, moderate intensity partially reversible anterolateral defect c/w prior MI & per-infarct ischemia, normal wall motion& EF - considered c/w anatomy.  . CAD (coronary artery disease), autologous vein bypass graft February 2007   Occluded SVG-OM1 with patent LIMA-LAD & seq SVG-OM2-3  . CAD in native artery 2005   multivessel CAD, ostial 50-60% LAD, mid LAD 75-80% after D1; OM 2 with 50-60% followed by 60% after OM1. RCA small vessel. --> CABG referral  . COVID-19 virus infection 06/2019   Had some respiratory symptoms, but mild case.  Marland Kitchen Dyslipidemia, goal LDL below 70    on statin  . Essential hypertension   . Hernia of abdominal cavity   . Mild mitral regurgitation by prior echocardiogram 04/2013   Echo: EF 55-60%, G1 DD,  mild MR  . Paroxysmal atrial fibrillation (HCC)    no recent documentation, although frequent mode switches noted but less than 2 minutes interval.  . S/P CABG x 4 2005   LIMA-LAD, SVG-OM1, seq SVG-OM 2-OM-3  . Status post cardiac pacemaker procedure 1996, 2003; 10-2013   Medtronic Adapta - replaced 10/2013  . Symptomatic sinus bradycardia 1996   status post pacemaker placement, exchanged in 2003 (Medtronic Sigma, in DDDR)    Past  Surgical History:  Procedure Laterality Date  . APPENDECTOMY    . CARDIAC CATHETERIZATION  01/17/2004   multivessel disease with osital 50-60% LAD, mid 70-80% after first diagonal, Cfx and OM2 had 50-60% lesion, 60% lesion after OM1 (Dr. Rockne Menghini)  . CARDIAC CATHETERIZATION  09/16/2005   SVG to OM1 occluded, other grafts patent, patent RCA, normal LV systolic function (Dr. Loni Muse. Little)  . CORONARY ARTERY BYPASS GRAFT  01/24/2004   LIMA-LAD, SVG-OM1, SVG-OM2/OM3 - Dr. Prescott Gum  . GENERATOR REMOVAL  10/14/2013   Procedure: GENERATOR REMOVAL;  Surgeon: Sanda Klein, MD;  Location: [facility];  Service: Cardiovascular;;  . HERNIA REPAIR    . NM MYOCAR PERF WALL MOTION  08/2005   mod inferolateral ischemia seen at apex and mid ventricular level, EF 62%  . NM [facility] PERF WALL MOTION  August 2017, December 2019   a) LOW RISK. EF 53%. Post CABG septal wall hypokinesis. There is a small sized mild severity defect in the mid anterolateral wall with no ischemia. Likely related to prior infarct (occluded SVG-OM1). Consistent with prior study.;; b) EF 50 to 54%.  Small area of inferolateral ischemia and basal level.  Report as ischemia -more c/w infarction.  Read as intermediate but interpreted as low RIK  . [place] ; 2003; 10-14-13   symptomatic bradycardia. generator change 2003 (Medtronic Sigma) DDDR; left sided device explanted 10-14-13 with new pacing system implatned on right side - MDT ADDRL1 by Dr Sallyanne Kuster  . TRANSTHORACIC ECHOCARDIOGRAM  1996; 04/2013   normal LV function, mild LA dilatation;; b) 10/'14 - EF 55-60%, Gr 1 DD, mild LA dilation, Mild MR    Current Medications: Outpatient Medications Prior to Visit  Medication Sig Dispense Refill  . aspirin EC 81 MG tablet Take 1 tablet (81 mg total) by mouth daily. 90 tablet 3  . glimepiride (AMARYL) 2 MG tablet Take 2 mg by mouth every morning. Pt takes 1.5 tablets in morning    . Lancets (ONETOUCH DELICA PLUS [id number]) MISC TEST EVERY  DAY AS DIRECTED    . losartan (COZAAR) 50 MG tablet Take 1 tablet (50 mg total) by mouth daily. 90 tablet 3  . metFORMIN (GLUCOPHAGE-XR) 500 MG 24 hr tablet Take 1,000 mg by mouth 2 (two) times daily.    . metoprolol tartrate (LOPRESSOR) 25 MG tablet Take 0.5 tablets (12.5 mg total) by mouth 2 (two) times daily. 90 tablet 3  . NITROSTAT 0.4 MG SL tablet Place 1 tablet under the tongue every 15 (fifteen) minutes as needed.    Glory Rosebush VERIO test strip daily.    . rosuvastatin (CRESTOR) 40 MG tablet TAKE 1 TABLET BY MOUTH EVERY DAY 90 tablet 2  . scopolamine (TRANSDERM-SCOP) 1 MG/3DAYS 1 PATCH TO SKIN BEHIND THE EAR AS NEEDED TRANSDERMAL 2 DAYS     No facility-administered medications prior to visit.     Allergies:   Ozempic (0.25 or 0.5 mg-dose) [semaglutide(0.25 or 0.5mg -dos)]   Social History   Socioeconomic History  . Marital status:  Widowed    Spouse name: Not on file  . Number of children: 2  . Years of education: Not on file  . Highest education level: Not on file  Occupational History    Employer: [employer]  Tobacco Use  . Smoking status: Never Smoker  . Smokeless tobacco: Never Used  Vaping Use  . Vaping Use: Never used  Substance and Sexual Activity  . Alcohol use: No  . Drug use: No  . Sexual activity: Not on file  Other Topics Concern  . Not on file  Social History Narrative   He is a father of 2, grandfather 73. He does chew tobacco on daily basis but quit smoking 12 years ago. Never drinks a fall. 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   Social Determinants of Health   Financial Resource Strain: Not on file  Food Insecurity: Not on file  Transportation Needs: Not on file  Physical Activity: Not on file  Stress: Not on file  Social Connections: Not on file     Family History:  The patient's family history includes Heart attack in his brother and father; Heart disease in his  father; Heart failure in his father.   ROS:   Please see the history of present illness.    ROS All other systems are reviewed and are negative.   PHYSICAL EXAM:   VS:  BP (!) 145/75 (BP Location: Left Arm, Patient Position: Sitting)   Pulse 63   Ht 6\' 1"  (1.854 m)   Wt 215 lb (97.5 kg)   SpO2 93%   BMI 28.37 kg/m   Wt Readings from Last 3 Encounters:  10/04/20 215 lb (97.5 kg)  09/11/20 217 lb 3.2 oz (98.5 kg)  03/23/20 213 lb (96.6 kg)      General: Alert, oriented x3, no distress, overweight.  Both left and right pacemaker sites appear healthy. Head: no evidence of trauma, PERRL, EOMI, no exophtalmos or lid lag, no myxedema, no xanthelasma; normal ears, nose and oropharynx Neck: normal jugular venous pulsations and no hepatojugular reflux; brisk carotid pulses without delay and no carotid bruits Chest: clear to auscultation, no signs of consolidation by percussion or palpation, normal fremitus, symmetrical and full respiratory excursions Cardiovascular: normal position and quality of the apical impulse, regular rhythm, normal first and widely split second heart sounds, no murmurs, rubs or gallops Abdomen: no tenderness or distention, no masses by palpation, no abnormal pulsatility or arterial bruits, normal bowel sounds, no hepatosplenomegaly Extremities: no clubbing, cyanosis or edema; 2+ radial, ulnar and brachial pulses bilaterally; 2+ right femoral, posterior tibial and dorsalis pedis pulses; 2+ left femoral, posterior tibial and dorsalis pedis pulses; no subclavian or femoral bruits Neurological: grossly nonfocal Psych: Normal mood and affect    Studies/Labs Reviewed:   Lipid Panel     Component Value Date/Time   CHOL 154 07/28/2017 0817   TRIG 116 07/28/2017 0817   HDL 43 07/28/2017 0817   CHOLHDL 3.6 07/28/2017 0817   LDLCALC 88 07/28/2017 0817   LABVLDL 23 07/28/2017 0817   06/15/2020 labs Total cholesterol 122, HDL 35, LDL 63, triglycerides 133 Hemoglobin  A1c 8.2%, creatinine 0.89, normal liver function tests  EKG: ECG is ordered today.  Personally reviewed, shows atrial paced, ventricular sensed rhythm with a chronic right bundle branch block.  Additional studies/ records that were reviewed today include:  Comprehensive pacemaker check performed by me, with reprogramming.  ASSESSMENT:    1. NSVT (nonsustained ventricular tachycardia) ([place])   2. SSS (sick sinus syndrome) ([place])   3. Pacemaker   4. Coronary artery disease involving native coronary artery of native heart without angina pectoris   5. Essential hypertension   6. Dyslipidemia, goal LDL below 70   7. Diabetes mellitus type 2 in nonobese [facility]      PLAN:  In order of problems listed above:  1. NSVT: No events have been seen in the last year.  He is on beta-blocker therapy and has had previous relatively lengthy episodes of nonsustained VT. 2. SSS: Complains of fatigue and his heart rate histogram appears a little blunted.  We will make the response a little more aggressive: Accelerometer round increase in both ADL and activity from 3 to 4. 3. PPM: Normal function of the right subclavian pacemaker.  The left sided pacemaker is deactivated (unipolar leads, and occluded subclavian vein on the left).  Continue remote downloads every 3 months.  Based on histograms may decide to bring him in for additional rate response adjustments. 4. CAD: Has some very vague chest discomfort with activity.  If this increases with the new pacemaker settings, it may indicate that he does have a new coronary stenosis..  Low risk nuclear stress test December 2019. 5. HTN: Usually well controlled, slight increase in systolic blood pressure today.  No changes made to his medications. 6. HLP: Excellent LDL, but stubbornly low HDL, consistent with the metabolic syndrome.  Encouraged more physical activity and weight loss. 7. DM: Encouraged him to discuss treatment with [place] or [place] when he next meets  with Dr. Ashby Dawes.  Considering his background with heart disease, he may benefit from these medications in the long run.   Medication Adjustments/Labs and Tests Ordered: Current medicines are reviewed at length with the patient today.  Concerns regarding medicines are outlined above.  Medication changes, Labs and Tests ordered today are listed in the Patient Instructions below. Patient Instructions  Medication Instructions:  No changes *If you need a refill on your cardiac medications before your next appointment, please call your pharmacy*   Lab Work: None ordered If you have labs (blood work) drawn today and your tests are completely normal, you will receive your results only by: Marland Kitchen MyChart Message (if you have MyChart) OR . A paper copy in the mail If you have any lab test that is abnormal or we need to change your treatment, we will call you to review the results.   Testing/Procedures: None ordered   Follow-Up: At [facility], you and your health needs are our priority.  As part of our continuing mission to provide you with exceptional heart care, we have created designated Provider Care Teams.  These Care Teams include your primary Cardiologist (physician) and Advanced Practice Providers (APPs -  Physician Assistants and Nurse Practitioners) who all work together to provide you with the care you need, when you need it.  We recommend signing up for the patient portal called "MyChart".  Sign up information is provided on this After Visit Summary.  MyChart is used to connect with patients for Virtual Visits (Telemedicine).  Patients are able to view lab/test results, encounter notes, upcoming appointments, etc.  Non-urgent messages can be sent to your provider as well.   To learn more about what you can do with MyChart, go to [email or web address].    Your next appointment:   12 month(s)  The format for your next appointment:   In Person  Provider:   Sanda Klein,  MD        Signed, Sanda Klein, MD  10/04/2020 5:40 PM    [facility] [address], [place], [place]  [address] Phone: [phone]; Fax: [phone]

## 2020-10-07 ENCOUNTER — Encounter: Payer: Self-pay | Admitting: Cardiology

## 2020-10-07 NOTE — Assessment & Plan Note (Signed)
This is still a chronic issue.  Is my view is not all that telling him anything significant.  Heart itself is a medication effect, I am reducing his beta-blocker and increasing ARB for better afterload reduction given the chance of diastolic dysfunction. Consider changing rate responsiveness of pacemaker.  Also the possibility of simply being deconditioned.

## 2020-10-07 NOTE — Assessment & Plan Note (Signed)
Blood pressure is high today.  I will increase his losartan dose to 50 mg while reducing metoprolol.  I have asked that he check his blood pressure at home with blood pressure log.  He tells me his pressures are better at home than they are here.

## 2020-10-07 NOTE — Assessment & Plan Note (Signed)
He is noticing fatigue, I will reduce his beta-blocker, but this should not really change the rate responsiveness of the pacemaker.    I have asked that he get scheduled to see Dr. Sallyanne Kuster in person to adjust his pacemaker.  Perhaps increase his responsiveness.  I wonder if he has chronotropic incompetence

## 2020-10-07 NOTE — Assessment & Plan Note (Signed)
No significant change in Myoview stress test.  Previously reported infarct present with peri-infarct ischemia.  Read as low risk, but there was no change from 2019.Marland Kitchen

## 2020-10-07 NOTE — Assessment & Plan Note (Signed)
Distant history of CABG 104 grain grafts occluded.  He has exertional dyspnea that we have evaluated recently with a Myoview stress test which was very similar to previous stress test.  There is partially reversible basal inferolateral anterolateral defect consistent with prior infarct and peri-infarct ischemia.  This is very similar to previous studies, not likely indicating anything more than known occlusion of the SVG-OM1.  If this were to worsen, I would expect that there was an issue with the vein graft to the OM 2 and OM 3.  He is on beta-blocker and ARB as well as aspirin and statin.  With his fatigue, we will reduce metoprolol down to 12-1/2 twice daily as his palpitations have improved, and increase losartan to 50 mg daily.

## 2020-10-07 NOTE — Assessment & Plan Note (Signed)
Known occlusion of SVG-OM1.  This probably correlates with this, and the abnormal stress test.  By last cath, the other grafts were open.  Territories no change in size compared to previous study.  This would argue that the vein graft to OM 2-OM 3 is still patent.

## 2020-10-07 NOTE — Assessment & Plan Note (Addendum)
Last labs in December showed LDL of 63.  In target range on current dose of rosuvastatin.  He stopped it as a statin holiday to see if it helps his fatigue, but no change. A1c in December was 8.2.  Apparently he did not tolerate Ozempic which is unfortunate, but perhaps he could benefit from SGLT2 inhibitor.  Defer to PCP, however there is clear-cut diabetic and cardiovascular benefit from SGLT2 inhibitors.   Continue to follow-up labs with PCP.

## 2020-10-24 ENCOUNTER — Ambulatory Visit (INDEPENDENT_AMBULATORY_CARE_PROVIDER_SITE_OTHER): Payer: PPO

## 2020-10-24 DIAGNOSIS — I495 Sick sinus syndrome: Secondary | ICD-10-CM

## 2020-10-25 LAB — CUP PACEART REMOTE DEVICE CHECK
Battery Impedance: 820 Ohm
Battery Remaining Longevity: 78 mo
Battery Voltage: 2.78 V
Brady Statistic AP VP Percent: 14 %
Brady Statistic AP VS Percent: 78 %
Brady Statistic AS VP Percent: 2 %
Brady Statistic AS VS Percent: 7 %
Date Time Interrogation Session: 20220413102854
Implantable Lead Implant Date: 20150403
Implantable Lead Implant Date: 20150403
Implantable Lead Location: 753859
Implantable Lead Location: 753860
Implantable Lead Model: 5076
Implantable Lead Model: 5076
Implantable Pulse Generator Implant Date: 20150403
Lead Channel Impedance Value: 498 Ohm
Lead Channel Impedance Value: 627 Ohm
Lead Channel Pacing Threshold Amplitude: 0.5 V
Lead Channel Pacing Threshold Amplitude: 0.625 V
Lead Channel Pacing Threshold Pulse Width: 0.4 ms
Lead Channel Pacing Threshold Pulse Width: 0.4 ms
Lead Channel Setting Pacing Amplitude: 1.5 V
Lead Channel Setting Pacing Amplitude: 2 V
Lead Channel Setting Pacing Pulse Width: 0.4 ms
Lead Channel Setting Sensing Sensitivity: 2.8 mV

## 2020-10-30 DIAGNOSIS — E113293 Type 2 diabetes mellitus with mild nonproliferative diabetic retinopathy without macular edema, bilateral: Secondary | ICD-10-CM | POA: Diagnosis not present

## 2020-10-30 DIAGNOSIS — H35372 Puckering of macula, left eye: Secondary | ICD-10-CM | POA: Diagnosis not present

## 2020-10-30 DIAGNOSIS — H40042 Steroid responder, left eye: Secondary | ICD-10-CM | POA: Diagnosis not present

## 2020-10-30 DIAGNOSIS — Z947 Corneal transplant status: Secondary | ICD-10-CM | POA: Diagnosis not present

## 2020-10-30 DIAGNOSIS — H18513 Endothelial corneal dystrophy, bilateral: Secondary | ICD-10-CM | POA: Diagnosis not present

## 2020-10-30 DIAGNOSIS — Z961 Presence of intraocular lens: Secondary | ICD-10-CM | POA: Diagnosis not present

## 2020-10-30 DIAGNOSIS — Z7984 Long term (current) use of oral hypoglycemic drugs: Secondary | ICD-10-CM | POA: Diagnosis not present

## 2020-11-08 NOTE — Progress Notes (Signed)
Remote pacemaker transmission.   

## 2020-11-09 DIAGNOSIS — H10503 Unspecified blepharoconjunctivitis, bilateral: Secondary | ICD-10-CM | POA: Diagnosis not present

## 2020-12-20 DIAGNOSIS — L814 Other melanin hyperpigmentation: Secondary | ICD-10-CM | POA: Diagnosis not present

## 2020-12-20 DIAGNOSIS — D485 Neoplasm of uncertain behavior of skin: Secondary | ICD-10-CM | POA: Diagnosis not present

## 2020-12-20 DIAGNOSIS — L570 Actinic keratosis: Secondary | ICD-10-CM | POA: Diagnosis not present

## 2020-12-20 DIAGNOSIS — D692 Other nonthrombocytopenic purpura: Secondary | ICD-10-CM | POA: Diagnosis not present

## 2020-12-20 DIAGNOSIS — L821 Other seborrheic keratosis: Secondary | ICD-10-CM | POA: Diagnosis not present

## 2021-01-04 DIAGNOSIS — R5383 Other fatigue: Secondary | ICD-10-CM | POA: Diagnosis not present

## 2021-01-04 DIAGNOSIS — E118 Type 2 diabetes mellitus with unspecified complications: Secondary | ICD-10-CM | POA: Diagnosis not present

## 2021-01-04 DIAGNOSIS — E782 Mixed hyperlipidemia: Secondary | ICD-10-CM | POA: Diagnosis not present

## 2021-01-11 DIAGNOSIS — E782 Mixed hyperlipidemia: Secondary | ICD-10-CM | POA: Diagnosis not present

## 2021-01-11 DIAGNOSIS — E113293 Type 2 diabetes mellitus with mild nonproliferative diabetic retinopathy without macular edema, bilateral: Secondary | ICD-10-CM | POA: Diagnosis not present

## 2021-01-11 DIAGNOSIS — I10 Essential (primary) hypertension: Secondary | ICD-10-CM | POA: Diagnosis not present

## 2021-01-11 DIAGNOSIS — I25119 Atherosclerotic heart disease of native coronary artery with unspecified angina pectoris: Secondary | ICD-10-CM | POA: Diagnosis not present

## 2021-01-23 ENCOUNTER — Ambulatory Visit (INDEPENDENT_AMBULATORY_CARE_PROVIDER_SITE_OTHER): Payer: PPO

## 2021-01-23 DIAGNOSIS — I495 Sick sinus syndrome: Secondary | ICD-10-CM

## 2021-01-24 LAB — CUP PACEART REMOTE DEVICE CHECK
Battery Impedance: 873 Ohm
Battery Remaining Longevity: 75 mo
Battery Voltage: 2.78 V
Brady Statistic AP VP Percent: 7 %
Brady Statistic AP VS Percent: 90 %
Brady Statistic AS VP Percent: 1 %
Brady Statistic AS VS Percent: 3 %
Date Time Interrogation Session: 20220713114756
Implantable Lead Implant Date: 20150403
Implantable Lead Implant Date: 20150403
Implantable Lead Location: 753859
Implantable Lead Location: 753860
Implantable Lead Model: 5076
Implantable Lead Model: 5076
Implantable Pulse Generator Implant Date: 20150403
Lead Channel Impedance Value: 464 Ohm
Lead Channel Impedance Value: 639 Ohm
Lead Channel Pacing Threshold Amplitude: 0.5 V
Lead Channel Pacing Threshold Amplitude: 0.625 V
Lead Channel Pacing Threshold Pulse Width: 0.4 ms
Lead Channel Pacing Threshold Pulse Width: 0.4 ms
Lead Channel Setting Pacing Amplitude: 1.5 V
Lead Channel Setting Pacing Amplitude: 2 V
Lead Channel Setting Pacing Pulse Width: 0.4 ms
Lead Channel Setting Sensing Sensitivity: 2 mV

## 2021-02-15 NOTE — Progress Notes (Signed)
Remote pacemaker transmission.   

## 2021-03-20 ENCOUNTER — Encounter: Payer: Self-pay | Admitting: Cardiology

## 2021-03-20 ENCOUNTER — Ambulatory Visit: Payer: PPO | Admitting: Cardiology

## 2021-03-20 ENCOUNTER — Other Ambulatory Visit: Payer: Self-pay

## 2021-03-20 VITALS — BP 128/74 | HR 66 | Ht 73.0 in | Wt 212.2 lb

## 2021-03-20 DIAGNOSIS — R001 Bradycardia, unspecified: Secondary | ICD-10-CM

## 2021-03-20 DIAGNOSIS — I2510 Atherosclerotic heart disease of native coronary artery without angina pectoris: Secondary | ICD-10-CM

## 2021-03-20 DIAGNOSIS — Z951 Presence of aortocoronary bypass graft: Secondary | ICD-10-CM | POA: Diagnosis not present

## 2021-03-20 DIAGNOSIS — Z950 Presence of cardiac pacemaker: Secondary | ICD-10-CM

## 2021-03-20 DIAGNOSIS — I4729 Other ventricular tachycardia: Secondary | ICD-10-CM

## 2021-03-20 DIAGNOSIS — I472 Ventricular tachycardia: Secondary | ICD-10-CM | POA: Diagnosis not present

## 2021-03-20 DIAGNOSIS — E1169 Type 2 diabetes mellitus with other specified complication: Secondary | ICD-10-CM | POA: Diagnosis not present

## 2021-03-20 DIAGNOSIS — E785 Hyperlipidemia, unspecified: Secondary | ICD-10-CM | POA: Diagnosis not present

## 2021-03-20 DIAGNOSIS — R5383 Other fatigue: Secondary | ICD-10-CM | POA: Diagnosis not present

## 2021-03-20 DIAGNOSIS — I10 Essential (primary) hypertension: Secondary | ICD-10-CM | POA: Diagnosis not present

## 2021-03-20 DIAGNOSIS — I4892 Unspecified atrial flutter: Secondary | ICD-10-CM

## 2021-03-20 DIAGNOSIS — I495 Sick sinus syndrome: Secondary | ICD-10-CM

## 2021-03-20 NOTE — Patient Instructions (Signed)
Medication Instructions:  No changes  *If you need a refill on your cardiac medications before your next appointment, please call your pharmacy*   Lab Work: Not needed    Testing/Procedures: Not needed   Follow-Up: At [facility], you and your health needs are our priority.  As part of our continuing mission to provide you with exceptional heart care, we have created designated Provider Care Teams.  These Care Teams include your primary Cardiologist (physician) and Advanced Practice Providers (APPs -  Physician Assistants and Nurse Practitioners) who all work together to provide you with the care you need, when you need it.  We recommend signing up for the patient portal called "MyChart".  Sign up information is provided on this After Visit Summary.  MyChart is used to connect with patients for Virtual Visits (Telemedicine).  Patients are able to view lab/test results, encounter notes, upcoming appointments, etc.  Non-urgent messages can be sent to your provider as well.   To learn more about what you can do with MyChart, go to [email or web address].    Your next appointment:   12 month(s)  The format for your next appointment:   In Person  Provider:   Glenetta Hew, MD   Other Instructions Your physician discussed the importance of regular exercise and recommended that you start or continue a regular exercise program for good health.  Exercise at least 30  to 40 min  a day

## 2021-03-20 NOTE — Progress Notes (Signed)
Primary Care Provider: Merrilee Seashore, MD Cardiologist: Glenetta Hew, MD Electrophysiologist: None  Clinic Note: Chief Complaint  Patient presents with   Follow-up    No major complaints.  Just not as active as he had been last year.  Less tolerant of heat.   Coronary Artery Disease    No angina    ===================================  ASSESSMENT/PLAN   Problem List Items Addressed This Visit       Cardiology Problems   Coronary artery disease involving native coronary artery of native heart without angina pectoris - Primary (Chronic)    Distant history of CABG in 2005.  Known occlusion of the SVG-OM1.  No further cath since 2007.  Has had multiple follow-up Myoview's with a similar findings (reversible basal inferolateral and anterolateral defect consistent with prior infarct and peri-infarct ischemia-consistent with known SVG-OM occlusion).  Most recently in November 2021.  He is very active with no anginal symptoms with rest or exertion.  Plan:  We have backed down beta-blocker to very minimal dose due to fatigue and this seems to have helped.  Because of his NSVT, we are continuing a low-dose beta-blocker along with ARB. On aspirin and rosuvastatin. Okay to hold aspirin 5-7 days preop for surgeries or procedures.       Essential hypertension (Chronic)    Blood pressure looks well controlled today. Continue current dose of losartan and low-dose Lopressor.      SSS (sick sinus syndrome) (HCC) (Chronic)    Has had issues with bradycardia.  On very low-dose beta-blocker because of history of NSVT.  Tolerating okay.      NSVT (nonsustained ventricular tachycardia) (HCC) (Chronic)    Nonischemic Myoview last year..  On beta-blocker with no breakthrough episodes.        Other   S/P CABG x 4 : LIMA-LAD, SVG-OM1 (known to be occluded), seq SVG-OM 2-OM 3 (Chronic)    He has had multiple stress tests with similar findings.  Last test was in 2021.  Would not be due  for follow-up testing to 2025.  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  Now no longer requiring DOT physical.      Status post cardiac pacemaker procedure (Chronic)    Interestingly, he does not seem to be paced very often.  We have backed off beta-blocker because of fatigue.      Excessive postexertional fatigue (Chronic)    This does not seem to be as much of a problem since backing off the beta-blocker.  We will continue to monitor.      Hyperlipidemia associated with type 2 diabetes mellitus ([place]) (Chronic)    Labs reviewed showed LDL of 65 on current dose of rosuvastatin.  Labs are being followed by PCP.  Well-controlled.  Continue current dose of rosuvastatin.  With A1c of 8.7, he is on Amaryl and metformin.  We will strongly consider SGLT2 inhibitor, but defer to PCP.       ===================================  HPI:    Erik Lane is a 70 y.o. male with a PMH notable for multivessel CAD having CABG (see below) with history of NSVT and symptomatic bradycardia s/p PPM along with chronic fatigue who presents today for 50-monthfollow-up.  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 Ischemia vs. Infarct or artifact CARDIAC CATH 2007 => Occluded SVG-OM1 & patent LIMA-LAD, SeqSVG-OM2-OM3.  MYOVIEW Nov 2021: Low Normal EF 51%. Small anterolateral mostly  fixed defect - c/w 2016, 2018 & 2019.  [name] was last seen on 09/11/2020-doing relatively well.  No active chest pain or discomfort.  Just has concerns with fatigue.  Some exertional dyspnea with subtle chest discomfort seen prior to stress test.  Frustrated at not Walk 3 miles a day.  Tries to exercise 2 to 3 days a week.  Notes mild fatigue.  Has to stop and rest every 10 minutes or so. Reduce metoprolol to 12.5 mg twice daily and increase losartan to 50 mg.  Recent Hospitalizations:   None  Reviewed  CV studies:    The following studies were reviewed today: (if available, images/films reviewed: From Epic Chart or Care Everywhere) None:  Interval History:   WILLIOM Lane presents here today overall doing pretty well.  He says has been no we will change since last time I saw him.  He still says that he is not as active as he had been stating that he has been just a little bit lazy.  Despite this, he still goes out and does yard work both at his house and then volunteers at [employer] in the morning.  He does quite a bit of walking and exercise doing that.  He enjoys walking, but has not really been as active with walking as he had been before, and not maintaining as much of his fitness.  As such, he has been getting a little bit short of breath if he overdoes it with walking.  Usually if he goes too fast or uphill.  He denies any resting exertional chest pain or pressure.  He has stable right calf pain which is no change before.  No real exacerbation while walking..  CV Review of Symptoms (Summary) Cardiovascular ROS: positive for - mild shortness of breath with exertion, mostly just fatigue from lack of activity.  Off-and-on leg cramping with activity. negative for - chest pain, edema, irregular heartbeat, orthopnea, palpitations, paroxysmal nocturnal dyspnea, rapid heart rate, shortness of breath, or lightheadedness, dizziness or wooziness, syncope/near syncope or TIA/amaurosis fugax, claudication  REVIEWED OF SYSTEMS   Review of Systems  Constitutional:  Positive for malaise/fatigue (He describes it more as being lazy then fatigued.  Just does not do as much-probably because of the heat of the summer.). Negative for weight loss.  HENT:  Negative for congestion and nosebleeds.   Respiratory: Negative.    Cardiovascular:        Per HPI  Gastrointestinal:  Negative for abdominal pain and blood in stool.  Genitourinary:  Negative for hematuria.  Musculoskeletal:   Positive for joint pain (Mild aches and pains).       Mild Off-and-on leg cramping, (mostly right calf) not associated with activity  Neurological:  Negative for dizziness, focal weakness and weakness.  Psychiatric/Behavioral: Negative.     I have reviewed and (if needed) personally updated the patient's problem list, medications, allergies, past medical and surgical history, social and family history.   PAST MEDICAL HISTORY   Past Medical History:  Diagnosis Date   Abnormal nuclear stress test March 2007; March 2016   for DOT physical/CDL license: Inferolateral ischemia - Considered False-Positive, but cath showed 100% SVG-OM occlusion; b.medium size, moderate intensity partially reversible anterolateral defect c/w prior MI & per-infarct ischemia, normal wall motion& EF - considered c/w anatomy.   CAD (coronary artery disease), autologous vein bypass graft February 2007   Occluded SVG-OM1 with patent LIMA-LAD & seq SVG-OM2-3   CAD in native artery 2005  multivessel CAD, ostial 50-60% LAD, mid LAD 75-80% after D1; OM 2 with 50-60% followed by 60% after OM1. RCA small vessel. --> CABG referral   COVID-19 virus infection 06/2019   Had some respiratory symptoms, but mild case.   Dyslipidemia, goal LDL below 70    on statin   Essential hypertension    Hernia of abdominal cavity    Mild mitral regurgitation by prior echocardiogram 04/2013   Echo: EF 55-60%, G1 DD, mild MR   Paroxysmal atrial fibrillation (HCC)    no recent documentation, although frequent mode switches noted but less than 2 minutes interval.   S/P CABG x 4 2005   LIMA-LAD, SVG-OM1, seq SVG-OM 2-OM-3   Status post cardiac pacemaker procedure 1996, 2003; 10-2013   Medtronic Adapta - replaced 10/2013   Symptomatic sinus bradycardia 1996   status post pacemaker placement, exchanged in 2003 (Medtronic Sigma, in DDDR)    PAST SURGICAL HISTORY   Past Surgical History:  Procedure Laterality Date   APPENDECTOMY     CARDIAC  CATHETERIZATION  01/17/2004   multivessel disease with osital 50-60% LAD, mid 70-80% after first diagonal, Cfx and OM2 had 50-60% lesion, 60% lesion after OM1 (Dr. Rockne Menghini)   CARDIAC CATHETERIZATION  09/16/2005   SVG to OM1 occluded, other grafts patent, patent RCA, normal LV systolic function (Dr. Loni Muse. Little)   CORONARY ARTERY BYPASS GRAFT  01/24/2004   LIMA-LAD, SVG-OM1, SVG-OM2/OM3 - Dr. Prescott Gum   GENERATOR REMOVAL  10/14/2013   Procedure: GENERATOR REMOVAL;  Surgeon: Sanda Klein, MD;  Location: [facility] CATH [facility];  Service: Cardiovascular;;   HERNIA REPAIR     NM MYOCAR PERF WALL MOTION  08/2005   mod inferolateral ischemia seen at apex and mid ventricular level, EF 62%   NM MYOCAR PERF WALL MOTION  August 2017, December 2019   a) LOW RISK. EF 53%. Post CABG septal wall hypokinesis. There is a small sized mild severity defect in the mid anterolateral wall with no ischemia. Likely related to prior infarct (occluded SVG-OM1). Consistent with prior study.;; b) EF 50 to 54%.  Small area of inferolateral ischemia and basal level.  Report as ischemia -more c/w infarction.  Read as intermediate but interpreted as low [place] ; 2003; 10-14-13   symptomatic bradycardia. generator change 2003 (Medtronic Sigma) DDDR; left sided device explanted 10-14-13 with new pacing system implatned on right side - MDT ADDRL1 by Dr Sallyanne Kuster   TRANSTHORACIC ECHOCARDIOGRAM  1996; 04/2013   normal LV function, mild LA dilatation;; b) 10/'14 - EF 55-60%, Gr 1 DD, mild LA dilation, Mild MR    Immunization History  Administered Date(s) Administered   Influenza, High Dose Seasonal PF 03/17/2017   PFIZER(Purple Top)SARS-COV-2 Vaccination 11/01/2019, 11/22/2019, 05/23/2020    MEDICATIONS/ALLERGIES   Current Meds  Medication Sig   aspirin EC 81 MG tablet Take 1 tablet (81 mg total) by mouth daily.   glimepiride (AMARYL) 2 MG tablet Take 2 mg by mouth every morning. Pt takes 1.5 tablets in morning    Lancets (ONETOUCH DELICA PLUS 123XX123) MISC TEST EVERY DAY AS DIRECTED   losartan (COZAAR) 50 MG tablet Take 1 tablet (50 mg total) by mouth daily.   metFORMIN (GLUCOPHAGE-XR) 500 MG 24 hr tablet Take 1,000 mg by mouth 2 (two) times daily.   metoprolol tartrate (LOPRESSOR) 25 MG tablet Take 0.5 tablets (12.5 mg total) by mouth 2 (two) times daily.   NITROSTAT 0.4 MG SL tablet Place 1 tablet under  the tongue every 15 (fifteen) minutes as needed.   ONETOUCH VERIO test strip daily.   rosuvastatin (CRESTOR) 40 MG tablet TAKE 1 TABLET BY MOUTH EVERY DAY   scopolamine (TRANSDERM-SCOP) 1 MG/3DAYS 1 PATCH TO SKIN BEHIND THE EAR AS NEEDED TRANSDERMAL 2 DAYS    Allergies  Allergen Reactions   Ozempic (0.25 Or 0.5 Mg-Dose) [Semaglutide(0.25 Or 0.'5mg'$ -Dos)] Nausea And Vomiting    SOCIAL HISTORY/FAMILY HISTORY   Reviewed in Epic:  Pertinent findings:  Social History   Tobacco Use   Smoking status: Never   Smokeless tobacco: Never  Vaping Use   Vaping Use: Never used  Substance Use Topics   Alcohol use: No   Drug use: No   Social History   Social History Narrative   He is a father of 2, grandfather 36. He does chew tobacco on daily basis but quit smoking 12 years ago. Never drinks a fall. 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    OBJCTIVE -PE, EKG, labs   Wt Readings from Last 3 Encounters:  03/20/21 212 lb 3.2 oz (96.3 kg)  10/04/20 215 lb (97.5 kg)  09/11/20 217 lb 3.2 oz (98.5 kg)    Physical Exam: BP 128/74   Pulse 66   Ht '6\' 1"'$  (1.854 m)   Wt 212 lb 3.2 oz (96.3 kg)   SpO2 98%   BMI 28.00 kg/m  Physical Exam Vitals reviewed.  Constitutional:      Appearance: Normal appearance. He is normal weight. He is not toxic-appearing or diaphoretic.  HENT:     Head: Normocephalic and atraumatic.  Neck:     Vascular: No carotid bruit or JVD.  Cardiovascular:     Rate and Rhythm: Normal rate and regular  rhythm. No extrasystoles are present.    Chest Wall: PMI is not displaced.     Pulses: Decreased pulses (Decreased, but palpable, feet are warm.).     Heart sounds: S1 normal. Heart sounds are distant. No murmur heard.   No gallop. No S4 sounds.     Comments: Split S2 Pulmonary:     Effort: Pulmonary effort is normal. No respiratory distress.     Breath sounds: Normal breath sounds. No stridor. No wheezing, rhonchi or rales.  Chest:     Chest wall: No tenderness.  Musculoskeletal:        General: No swelling. Normal range of motion.     Cervical back: Normal range of motion and neck supple.  Skin:    General: Skin is warm and dry.  Neurological:     General: No focal deficit present.     Mental Status: He is alert and oriented to person, place, and time.  Psychiatric:        Mood and Affect: Mood normal.        Behavior: Behavior normal.        Thought Content: Thought content normal.        Judgment: Judgment normal.    Adult ECG Report N/a  Recent Labs:   01/04/2021 : TC 151, TG 204, HDL 38, LDL 65.  A1c 8.7. 09/21/2020: Cr 1.02, K+ 4.5. Lab Results  Component Value Date   CHOL 154 07/28/2017   HDL 43 07/28/2017   LDLCALC 88 07/28/2017   TRIG 116 07/28/2017   CHOLHDL 3.6 07/28/2017   Lab Results  Component Value Date   CREATININE 0.82 07/28/2017   BUN 15 07/28/2017  NA 142 07/28/2017   K 4.5 07/28/2017   CL 105 07/28/2017   CO2 23 07/28/2017   CBC Latest Ref Rng & Units 01/05/2015 10/10/2013 06/13/2011  WBC 4.0 - 10.5 K/uL 7.6 8.8 10.8(H)  Hemoglobin 13.0 - 17.0 g/dL 14.1 15.2 15.4  Hematocrit 39.0 - 52.0 % 41.8 44.8 45.3  Platelets 150 - 400 K/uL 155 176 175    No results found for: HGBA1C No results found for: TSH  ==================================================  COVID-19 Education: The signs and symptoms of COVID-19 were discussed with the patient and how to seek care for testing (follow up with PCP or arrange E-visit).    I spent a total of 25  minutes with the patient spent in direct patient consultation.  Additional time spent with chart review  / charting (studies, outside notes, etc): 16 min Total Time: 41 min  Current medicines are reviewed at length with the patient today.  (+/- concerns) none  This visit occurred during the SARS-CoV-2 public health emergency.  Safety protocols were in place, including screening questions prior to the visit, additional usage of staff PPE, and extensive cleaning of exam room while observing appropriate contact time as indicated for disinfecting solutions.  Notice: This dictation was prepared with Dragon dictation along with smaller phrase technology. Any transcriptional errors that result from this process are unintentional and may not be corrected upon review.  Patient Instructions / Medication Changes & Studies & Tests Ordered   Patient Instructions  Medication Instructions:  No changes  *If you need a refill on your cardiac medications before your next appointment, please call your pharmacy*   Lab Work: Not needed    Testing/Procedures: Not needed   Follow-Up: At [facility], you and your health needs are our priority.  As part of our continuing mission to provide you with exceptional heart care, we have created designated Provider Care Teams.  These Care Teams include your primary Cardiologist (physician) and Advanced Practice Providers (APPs -  Physician Assistants and Nurse Practitioners) who all work together to provide you with the care you need, when you need it.  We recommend signing up for the patient portal called "MyChart".  Sign up information is provided on this After Visit Summary.  MyChart is used to connect with patients for Virtual Visits (Telemedicine).  Patients are able to view lab/test results, encounter notes, upcoming appointments, etc.  Non-urgent messages can be sent to your provider as well.   To learn more about what you can do with MyChart, go to  [email or web address].    Your next appointment:   12 month(s)  The format for your next appointment:   In Person  Provider:   Glenetta Hew, MD   Other Instructions Your physician discussed the importance of regular exercise and recommended that you start or continue a regular exercise program for good health.  Exercise at least 30  to 40 min  a day     Studies Ordered:   No orders of the defined types were placed in this encounter.    Glenetta Hew, M.D., M.S. Interventional Cardiologist   Pager # [phone] Phone # [phone] [address] [place], [place] [address]   Thank you for choosing [facility] at [facility]!!

## 2021-04-02 ENCOUNTER — Encounter: Payer: Self-pay | Admitting: Cardiology

## 2021-04-02 NOTE — Assessment & Plan Note (Signed)
Interestingly, he does not seem to be paced very often.  We have backed off beta-blocker because of fatigue.

## 2021-04-02 NOTE — Assessment & Plan Note (Signed)
Labs reviewed showed LDL of 65 on current dose of rosuvastatin.  Labs are being followed by PCP.  Well-controlled.  Continue current dose of rosuvastatin.  With A1c of 8.7, he is on Amaryl and metformin.  We will strongly consider SGLT2 inhibitor, but defer to PCP.

## 2021-04-02 NOTE — Assessment & Plan Note (Addendum)
Has had issues with bradycardia.  On very low-dose beta-blocker because of history of NSVT.  Tolerating okay.

## 2021-04-02 NOTE — Assessment & Plan Note (Signed)
Blood pressure looks well controlled today. Continue current dose of losartan and low-dose Lopressor.

## 2021-04-02 NOTE — Assessment & Plan Note (Addendum)
Nonischemic Myoview last year..  On beta-blocker with no breakthrough episodes.

## 2021-04-02 NOTE — Assessment & Plan Note (Signed)
Distant history of CABG in 2005.  Known occlusion of the SVG-OM1.  No further cath since 2007.  Has had multiple follow-up Myoview's with a similar findings (reversible basal inferolateral and anterolateral defect consistent with prior infarct and peri-infarct ischemia-consistent with known SVG-OM occlusion).  Most recently in November 2021.  He is very active with no anginal symptoms with rest or exertion.  Plan:   We have backed down beta-blocker to very minimal dose due to fatigue and this seems to have helped.  Because of his NSVT, we are continuing a low-dose beta-blocker along with ARB.  On aspirin and rosuvastatin.  Okay to hold aspirin 5-7 days preop for surgeries or procedures.

## 2021-04-02 NOTE — Assessment & Plan Note (Signed)
This does not seem to be as much of a problem since backing off the beta-blocker.  We will continue to monitor.

## 2021-04-02 NOTE — Assessment & Plan Note (Signed)
He has had multiple stress tests with similar findings.  Last test was in 2021.  Would not be due for follow-up testing to 2025.  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  Now no longer requiring DOT physical.

## 2021-04-19 DIAGNOSIS — E113293 Type 2 diabetes mellitus with mild nonproliferative diabetic retinopathy without macular edema, bilateral: Secondary | ICD-10-CM | POA: Diagnosis not present

## 2021-04-19 DIAGNOSIS — E782 Mixed hyperlipidemia: Secondary | ICD-10-CM | POA: Diagnosis not present

## 2021-04-19 DIAGNOSIS — I10 Essential (primary) hypertension: Secondary | ICD-10-CM | POA: Diagnosis not present

## 2021-04-19 DIAGNOSIS — I25119 Atherosclerotic heart disease of native coronary artery with unspecified angina pectoris: Secondary | ICD-10-CM | POA: Diagnosis not present

## 2021-04-26 DIAGNOSIS — E113293 Type 2 diabetes mellitus with mild nonproliferative diabetic retinopathy without macular edema, bilateral: Secondary | ICD-10-CM | POA: Diagnosis not present

## 2021-04-26 DIAGNOSIS — E1165 Type 2 diabetes mellitus with hyperglycemia: Secondary | ICD-10-CM | POA: Diagnosis not present

## 2021-04-26 DIAGNOSIS — Z0000 Encounter for general adult medical examination without abnormal findings: Secondary | ICD-10-CM | POA: Diagnosis not present

## 2021-04-26 DIAGNOSIS — L600 Ingrowing nail: Secondary | ICD-10-CM | POA: Diagnosis not present

## 2021-04-26 DIAGNOSIS — N182 Chronic kidney disease, stage 2 (mild): Secondary | ICD-10-CM | POA: Diagnosis not present

## 2021-04-26 DIAGNOSIS — I4892 Unspecified atrial flutter: Secondary | ICD-10-CM | POA: Diagnosis not present

## 2021-04-26 DIAGNOSIS — I10 Essential (primary) hypertension: Secondary | ICD-10-CM | POA: Diagnosis not present

## 2021-04-26 DIAGNOSIS — I2510 Atherosclerotic heart disease of native coronary artery without angina pectoris: Secondary | ICD-10-CM | POA: Diagnosis not present

## 2021-04-26 DIAGNOSIS — E782 Mixed hyperlipidemia: Secondary | ICD-10-CM | POA: Diagnosis not present

## 2021-05-03 ENCOUNTER — Other Ambulatory Visit: Payer: Self-pay | Admitting: Cardiology

## 2021-06-24 DIAGNOSIS — D0422 Carcinoma in situ of skin of left ear and external auricular canal: Secondary | ICD-10-CM | POA: Diagnosis not present

## 2021-06-25 ENCOUNTER — Other Ambulatory Visit: Payer: Self-pay | Admitting: Cardiology

## 2021-06-25 NOTE — Telephone Encounter (Signed)
Medication refilled per request.

## 2021-07-01 DIAGNOSIS — C44229 Squamous cell carcinoma of skin of left ear and external auricular canal: Secondary | ICD-10-CM | POA: Diagnosis not present

## 2021-07-16 ENCOUNTER — Encounter: Payer: Self-pay | Admitting: Podiatry

## 2021-07-16 ENCOUNTER — Ambulatory Visit: Payer: PPO | Admitting: Podiatry

## 2021-07-16 ENCOUNTER — Other Ambulatory Visit: Payer: Self-pay

## 2021-07-16 DIAGNOSIS — L600 Ingrowing nail: Secondary | ICD-10-CM | POA: Diagnosis not present

## 2021-07-16 NOTE — Progress Notes (Signed)
°  Subjective:  Patient ID: Erik Lane, male    DOB: August 19, 1950,  MRN: [id number]  Chief Complaint  Patient presents with   Nail Problem      (np) Ingrown toenail of left big toe, diabetic nail care    71 y.o. male presents with the above complaint. History confirmed with patient.  His last A1c is 8.4%.  Denies numbness and tingling in the feet.  The nail digs into the corner and his doctor recommended he have it evaluated  Objective:  Physical Exam: warm, good capillary refill, no trophic changes or ulcerative lesions, normal DP and PT pulses, and normal sensory exam. Left Foot: Slightly incurvated hallux nail medial border digging and no paronychia no deep ingrown Assessment:  No diagnosis found.   Plan:  Patient was evaluated and treated and all questions answered.  Counseled him on nail care and cutting his nails and a manner that would not promote ingrown's.  I debrided the medial border in a slant back fashion.  We discussed the option of matricectomy if this continues to recur but advised him I would want his A1c to be under 8% before doing this to reduce risk of infection or nonhealing.  I trimmed the remainder of his nails as a courtesy.  He will return as needed for this or other issues  Return if symptoms worsen or fail to improve.

## 2021-07-24 ENCOUNTER — Ambulatory Visit (INDEPENDENT_AMBULATORY_CARE_PROVIDER_SITE_OTHER): Payer: PPO

## 2021-07-24 DIAGNOSIS — I495 Sick sinus syndrome: Secondary | ICD-10-CM

## 2021-07-24 LAB — CUP PACEART REMOTE DEVICE CHECK
Battery Impedance: 976 Ohm
Battery Remaining Longevity: 71 mo
Battery Voltage: 2.78 V
Brady Statistic AP VP Percent: 3 %
Brady Statistic AP VS Percent: 94 %
Brady Statistic AS VP Percent: 0 %
Brady Statistic AS VS Percent: 2 %
Date Time Interrogation Session: 20230111132325
Implantable Lead Implant Date: 20150403
Implantable Lead Implant Date: 20150403
Implantable Lead Location: 753859
Implantable Lead Location: 753860
Implantable Lead Model: 5076
Implantable Lead Model: 5076
Implantable Pulse Generator Implant Date: 20150403
Lead Channel Impedance Value: 485 Ohm
Lead Channel Impedance Value: 637 Ohm
Lead Channel Pacing Threshold Amplitude: 0.5 V
Lead Channel Pacing Threshold Amplitude: 0.625 V
Lead Channel Pacing Threshold Pulse Width: 0.4 ms
Lead Channel Pacing Threshold Pulse Width: 0.4 ms
Lead Channel Setting Pacing Amplitude: 1.5 V
Lead Channel Setting Pacing Amplitude: 2 V
Lead Channel Setting Pacing Pulse Width: 0.4 ms
Lead Channel Setting Sensing Sensitivity: 2 mV

## 2021-08-02 NOTE — Progress Notes (Signed)
Remote pacemaker transmission.   

## 2021-09-23 ENCOUNTER — Other Ambulatory Visit: Payer: Self-pay | Admitting: Cardiology

## 2021-10-23 ENCOUNTER — Ambulatory Visit (INDEPENDENT_AMBULATORY_CARE_PROVIDER_SITE_OTHER): Payer: PPO

## 2021-10-23 DIAGNOSIS — I495 Sick sinus syndrome: Secondary | ICD-10-CM | POA: Diagnosis not present

## 2021-10-23 LAB — CUP PACEART REMOTE DEVICE CHECK
Battery Impedance: 1168 Ohm
Battery Remaining Longevity: 64 mo
Battery Voltage: 2.78 V
Brady Statistic AP VP Percent: 3 %
Brady Statistic AP VS Percent: 95 %
Brady Statistic AS VP Percent: 0 %
Brady Statistic AS VS Percent: 2 %
Date Time Interrogation Session: 20230412144433
Implantable Lead Implant Date: 20150403
Implantable Lead Implant Date: 20150403
Implantable Lead Location: 753859
Implantable Lead Location: 753860
Implantable Lead Model: 5076
Implantable Lead Model: 5076
Implantable Pulse Generator Implant Date: 20150403
Lead Channel Impedance Value: 499 Ohm
Lead Channel Impedance Value: 630 Ohm
Lead Channel Pacing Threshold Amplitude: 0.5 V
Lead Channel Pacing Threshold Amplitude: 0.625 V
Lead Channel Pacing Threshold Pulse Width: 0.4 ms
Lead Channel Pacing Threshold Pulse Width: 0.4 ms
Lead Channel Setting Pacing Amplitude: 1.5 V
Lead Channel Setting Pacing Amplitude: 2 V
Lead Channel Setting Pacing Pulse Width: 0.4 ms
Lead Channel Setting Sensing Sensitivity: 2.8 mV

## 2021-11-08 NOTE — Progress Notes (Signed)
Remote pacemaker transmission.   

## 2021-11-28 ENCOUNTER — Encounter: Payer: Self-pay | Admitting: Cardiovascular Disease

## 2021-11-28 ENCOUNTER — Ambulatory Visit (INDEPENDENT_AMBULATORY_CARE_PROVIDER_SITE_OTHER): Payer: PPO | Admitting: Cardiovascular Disease

## 2021-11-28 VITALS — BP 124/78 | HR 66 | Ht 73.0 in | Wt 210.6 lb

## 2021-11-28 DIAGNOSIS — E1169 Type 2 diabetes mellitus with other specified complication: Secondary | ICD-10-CM

## 2021-11-28 DIAGNOSIS — Z950 Presence of cardiac pacemaker: Secondary | ICD-10-CM | POA: Diagnosis not present

## 2021-11-28 DIAGNOSIS — E119 Type 2 diabetes mellitus without complications: Secondary | ICD-10-CM

## 2021-11-28 DIAGNOSIS — I495 Sick sinus syndrome: Secondary | ICD-10-CM

## 2021-11-28 DIAGNOSIS — I2510 Atherosclerotic heart disease of native coronary artery without angina pectoris: Secondary | ICD-10-CM

## 2021-11-28 DIAGNOSIS — E785 Hyperlipidemia, unspecified: Secondary | ICD-10-CM

## 2021-11-28 DIAGNOSIS — I4729 Other ventricular tachycardia: Secondary | ICD-10-CM

## 2021-11-28 DIAGNOSIS — I10 Essential (primary) hypertension: Secondary | ICD-10-CM

## 2021-11-28 NOTE — Progress Notes (Signed)
Cardiology Office Note    Date:  12/02/2021   ID:  Erik Lane, DOB January 18, 1951, MRN [id number]  PCP:  Merrilee Seashore, MD  Cardiologist:  Glenetta Hew, M.D.; Sanda Klein, MD   Chief Complaint  Patient presents with   Pacemaker Check         History of Present Illness:  Erik Lane is a 71 y.o. male with an extensive history of coronary artery disease and previous bypass surgery, here for pacemaker check. His device is a dual-chamber Medtronic Adapta implanted April 2015 for sinus node dysfunction.  He feels well.  His complaints of fatigue are improved compared to last year.  He has not had anginal chest pain at rest or with activity, palpitations, dizziness, syncope, focal neurological complaints, leg edema, orthopnea or PND.  Pacemaker interrogation shows normal device function.  He has a Medtronic Adapta device implanted in the right subclavian area with an estimated remaining generator longevity of 5.5 years.  Both the atrial and ventricular leads are 5076 leads.  He has 98% atrial pacing with good heart rate histogram distribution and only 3% ventricular pacing.  He has not had any episodes of high ventricular rates.  He has rare episodes of atrial mode switch all of them less than 30 seconds in duration.  He has an abandoned pacemaker system with unipolar leads on the left side.  He is always had waxing and waning success with maintaining glycemic control and his hemoglobin A1c was most recently 8.6%, up from 8% last year.  On statin his most recent LDL cholesterol was approximately 90, up from 65 last March.  He originally received a pacemaker in 1996 (unipolar leads) sinus node dysfunction with intermittent AV block and had a generator change in 2003.  Unipolar lead "noise" began interfering with device function with numerous episodes of mode switch due to artifact.  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  The old pacemaker was  deactivated but left in place and he received an entirely new system in the right subclavian area in 2015 with new bipolar leads.  Ever since the new system was implanted we have not recorded any "atrial flutter" or "atrial fibrillation" as reported on his old unipolar leads.   Past Medical History:  Diagnosis Date   Abnormal nuclear stress test March 2007; March 2016   for DOT physical/CDL license: Inferolateral ischemia - Considered False-Positive, but cath showed 100% SVG-OM occlusion; b.medium size, moderate intensity partially reversible anterolateral defect c/w prior MI & per-infarct ischemia, normal wall motion& EF - considered c/w anatomy.   CAD (coronary artery disease), autologous vein bypass graft February 2007   Occluded SVG-OM1 with patent LIMA-LAD & seq SVG-OM2-3   CAD in native artery 2005   multivessel CAD, ostial 50-60% LAD, mid LAD 75-80% after D1; OM 2 with 50-60% followed by 60% after OM1. RCA small vessel. --> CABG referral   COVID-19 virus infection 06/2019   Had some respiratory symptoms, but mild case.   Dyslipidemia, goal LDL below 70    on statin   Essential hypertension    Hernia of abdominal cavity    Mild mitral regurgitation by prior echocardiogram 04/2013   Echo: EF 55-60%, G1 DD, mild MR   Paroxysmal atrial fibrillation (HCC)    no recent documentation, although frequent mode switches noted but less than 2 minutes interval.   S/P CABG x 4 2005   LIMA-LAD, SVG-OM1, seq SVG-OM 2-OM-3   Status post cardiac pacemaker  procedure 1996, 2003; 10-2013   Medtronic Adapta - replaced 10/2013   Symptomatic sinus bradycardia 1996   status post pacemaker placement, exchanged in 2003 (Medtronic Sigma, in DDDR)    Past Surgical History:  Procedure Laterality Date   APPENDECTOMY     CARDIAC CATHETERIZATION  01/17/2004   multivessel disease with osital 50-60% LAD, mid 70-80% after first diagonal, Cfx and OM2 had 50-60% lesion, 60% lesion after OM1 (Dr. Rockne Menghini)   CARDIAC  CATHETERIZATION  09/16/2005   SVG to OM1 occluded, other grafts patent, patent RCA, normal LV systolic function (Dr. Loni Muse. Little)   CORONARY ARTERY BYPASS GRAFT  01/24/2004   LIMA-LAD, SVG-OM1, SVG-OM2/OM3 - Dr. Prescott Gum   GENERATOR REMOVAL  10/14/2013   Procedure: GENERATOR REMOVAL;  Surgeon: Sanda Klein, MD;  Location: [facility];  Service: Cardiovascular;;   HERNIA REPAIR     NM MYOCAR PERF WALL MOTION  08/2005   mod inferolateral ischemia seen at apex and mid ventricular level, EF 62%   NM MYOCAR PERF WALL MOTION  August 2017, December 2019   a) LOW RISK. EF 53%. Post CABG septal wall hypokinesis. There is a small sized mild severity defect in the mid anterolateral wall with no ischemia. Likely related to prior infarct (occluded SVG-OM1). Consistent with prior study.;; b) EF 50 to 54%.  Small area of inferolateral ischemia and basal level.  Report as ischemia -more c/w infarction.  Read as intermediate but interpreted as low [place] ; 2003; 10-14-13   symptomatic bradycardia. generator change 2003 (Medtronic Sigma) DDDR; left sided device explanted 10-14-13 with new pacing system implatned on right side - MDT ADDRL1 by Dr Sallyanne Kuster   TRANSTHORACIC ECHOCARDIOGRAM  1996; 04/2013   normal LV function, mild LA dilatation;; b) 10/'14 - EF 55-60%, Gr 1 DD, mild LA dilation, Mild MR    Current Medications: Outpatient Medications Prior to Visit  Medication Sig Dispense Refill   aspirin EC 81 MG tablet Take 1 tablet (81 mg total) by mouth daily. 90 tablet 3   glimepiride (AMARYL) 2 MG tablet Take 2 mg by mouth every morning. Pt takes 1.5 tablets in morning     Lancets (ONETOUCH DELICA PLUS [id number]) MISC TEST EVERY DAY AS DIRECTED     losartan (COZAAR) 50 MG tablet TAKE 1 TABLET BY MOUTH EVERY DAY 90 tablet 3   metFORMIN (GLUCOPHAGE-XR) 500 MG 24 hr tablet Take 1,000 mg by mouth 2 (two) times daily.     metoprolol tartrate (LOPRESSOR) 25 MG tablet TAKE 0.5 TABLETS BY MOUTH 2  TIMES DAILY. 90 tablet 3   NITROSTAT 0.4 MG SL tablet Place 1 tablet under the tongue every 15 (fifteen) minutes as needed.     ONETOUCH VERIO test strip daily.     rosuvastatin (CRESTOR) 40 MG tablet TAKE 1 TABLET BY MOUTH EVERY DAY 90 tablet 3   scopolamine (TRANSDERM-SCOP) 1 MG/3DAYS 1 PATCH TO SKIN BEHIND THE EAR AS NEEDED TRANSDERMAL 2 DAYS     No facility-administered medications prior to visit.     Allergies:   Ozempic (0.25 or 0.5 mg-dose) [semaglutide(0.25 or 0.'5mg'$ -dos)]   Social History   Socioeconomic History   Marital status: Widowed    Spouse name: Not on file   Number of children: 2   Years of education: Not on file   Highest education level: Not on file  Occupational History    Employer: [employer]  Tobacco Use   Smoking status: Never  Smokeless tobacco: Never  Vaping Use   Vaping Use: Never used  Substance and Sexual Activity   Alcohol use: No   Drug use: No   Sexual activity: Not on file  Other Topics Concern   Not on file  Social History Narrative   He is a father of 2, grandfather 18. He does chew tobacco on daily basis but quit smoking 12 years ago. Never drinks a fall. 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   Social Determinants of Health   Financial Resource Strain: Not on file  Food Insecurity: Not on file  Transportation Needs: Not on file  Physical Activity: Not on file  Stress: Not on file  Social Connections: Not on file     Family History:  The patient's family history includes Heart attack in his brother and father; Heart disease in his father; Heart failure in his father.   ROS:   Please see the history of present illness.    ROS All other systems are reviewed and are negative.   PHYSICAL EXAM:   VS:  BP 124/78   Pulse 66   Ht '6\' 1"'$  (1.854 m)   Wt 210 lb 9.6 oz (95.5 kg)   SpO2 96%   BMI 27.79 kg/m   Wt Readings from Last 3 Encounters:  11/28/21  210 lb 9.6 oz (95.5 kg)  03/20/21 212 lb 3.2 oz (96.3 kg)  10/04/20 215 lb (97.5 kg)     General: Alert, oriented x3, no distress, pacemaker devices are present in both the right and left subclavian area, both areas look healthy Head: no evidence of trauma, PERRL, EOMI, no exophtalmos or lid lag, no myxedema, no xanthelasma; normal ears, nose and oropharynx Neck: normal jugular venous pulsations and no hepatojugular reflux; brisk carotid pulses without delay and no carotid bruits Chest: clear to auscultation, no signs of consolidation by percussion or palpation, normal fremitus, symmetrical and full respiratory excursions Cardiovascular: normal position and quality of the apical impulse, regular rhythm, normal first and second heart sounds, no murmurs, rubs or gallops Abdomen: no tenderness or distention, no masses by palpation, no abnormal pulsatility or arterial bruits, normal bowel sounds, no hepatosplenomegaly Extremities: no clubbing, cyanosis or edema; 2+ radial, ulnar and brachial pulses bilaterally; 2+ right femoral, posterior tibial and dorsalis pedis pulses; 2+ left femoral, posterior tibial and dorsalis pedis pulses; no subclavian or femoral bruits Neurological: grossly nonfocal Psych: Normal mood and affect    Studies/Labs Reviewed:   Lipid Panel     Component Value Date/Time   CHOL 154 07/28/2017 0817   TRIG 116 07/28/2017 0817   HDL 43 07/28/2017 0817   CHOLHDL 3.6 07/28/2017 0817   LDLCALC 88 07/28/2017 0817   LABVLDL 23 07/28/2017 0817   06/15/2020 labs Total cholesterol 122, HDL 35, LDL 63, triglycerides 133 Hemoglobin A1c 8.2%, creatinine 0.89, normal liver function tests 09/21/2020 LDL 65, hemoglobin A1c 8.0% 01/04/2021 Cholesterol 151, HDL 38 08/30/2021 triglycerides 155  EKG: ECG is ordered today.  Personally reviewed, shows atrial paced, ventricular sensed rhythm with right bundle branch block.  No ischemic changes.  QTc 444 ms.  Additional studies/ records  that were reviewed today include:  Comprehensive pacemaker check performed by me, with reprogramming.  ASSESSMENT:    1. SSS (sick sinus syndrome) ([place])   2. NSVT (nonsustained ventricular tachycardia) ([place])   3. Pacemaker   4. Coronary artery disease involving native coronary artery of native  heart without angina pectoris   5. Essential hypertension   6. Hyperlipidemia associated with type 2 diabetes mellitus ([place])   7. Diabetes mellitus type 2 in nonobese [facility])      PLAN:  In order of problems listed above:  SSS: Has improved exercise tolerance after we made his rate response sensor settings more aggressive. NSVT: None seen in the last 12 months.  He has a history of relatively long episodes of nonsustained VT and is taking beta-blockers. PPM: Normal function of the right subclavian pacemaker.  The left sided pacemaker is deactivated (unipolar leads, and occluded subclavian vein on the left).  Continue remote downloads every 3 months. CAD: Asymptomatic.  Low risk nuclear stress test December 2019. HTN: Well-controlled HLP: Chronic low HDL.  Appears to have had an increase in LDL recently, may be related to deterioration in glucose control.  On maximum dose rosuvastatin.  Target LDL less than 70.  Consider adding Zetia if necessary. DM: He is on glimepiride and metformin.  Control is not as good as last year which may explain his worsening cholesterol profile.  Because of his history of coronary disease would have a low threshold for switching to Jardiance/Farxiga or Ozempic/Trulicity for improved cardiovascular outcomes.   Medication Adjustments/Labs and Tests Ordered: Current medicines are reviewed at length with the patient today.  Concerns regarding medicines are outlined above.  Medication changes, Labs and Tests ordered today are listed in the Patient Instructions below. Patient Instructions  Medication Instructions:  No chnages *If you need a refill on your cardiac medications  before your next appointment, please call your pharmacy*   Lab Work: None ordered If you have labs (blood work) drawn today and your tests are completely normal, you will receive your results only by: [place] (if you have MyChart) OR A paper copy in the mail If you have any lab test that is abnormal or we need to change your treatment, we will call you to review the results.   Testing/Procedures: None ordered   Follow-Up: At [facility], you and your health needs are our priority.  As part of our continuing mission to provide you with exceptional heart care, we have created designated Provider Care Teams.  These Care Teams include your primary Cardiologist (physician) and Advanced Practice Providers (APPs -  Physician Assistants and Nurse Practitioners) who all work together to provide you with the care you need, when you need it.  We recommend signing up for the patient portal called "MyChart".  Sign up information is provided on this After Visit Summary.  MyChart is used to connect with patients for Virtual Visits (Telemedicine).  Patients are able to view lab/test results, encounter notes, upcoming appointments, etc.  Non-urgent messages can be sent to your provider as well.   To learn more about what you can do with MyChart, go to [email or web address].    Your next appointment:   12 month(s)  The format for your next appointment:   In Person  Provider:   Dr. Sallyanne Kuster  Important Information About Sugar         Signed, Sanda Klein, MD  12/02/2021 4:29 PM    [address], [place], [place]  [address] Phone: [phone]; Fax: [phone]

## 2021-11-28 NOTE — Patient Instructions (Signed)
Medication Instructions:  No chnages *If you need a refill on your cardiac medications before your next appointment, please call your pharmacy*   Lab Work: None ordered If you have labs (blood work) drawn today and your tests are completely normal, you will receive your results only by: [place] (if you have MyChart) OR A paper copy in the mail If you have any lab test that is abnormal or we need to change your treatment, we will call you to review the results.   Testing/Procedures: None ordered   Follow-Up: At [facility], you and your health needs are our priority.  As part of our continuing mission to provide you with exceptional heart care, we have created designated Provider Care Teams.  These Care Teams include your primary Cardiologist (physician) and Advanced Practice Providers (APPs -  Physician Assistants and Nurse Practitioners) who all work together to provide you with the care you need, when you need it.  We recommend signing up for the patient portal called "MyChart".  Sign up information is provided on this After Visit Summary.  MyChart is used to connect with patients for Virtual Visits (Telemedicine).  Patients are able to view lab/test results, encounter notes, upcoming appointments, etc.  Non-urgent messages can be sent to your provider as well.   To learn more about what you can do with MyChart, go to [email or web address].    Your next appointment:   12 month(s)  The format for your next appointment:   In Person  Provider:   Dr. Sallyanne Kuster  Important Information About Sugar

## 2021-12-02 ENCOUNTER — Encounter: Payer: Self-pay | Admitting: Cardiovascular Disease

## 2022-01-22 ENCOUNTER — Ambulatory Visit (INDEPENDENT_AMBULATORY_CARE_PROVIDER_SITE_OTHER): Payer: PPO

## 2022-01-22 DIAGNOSIS — I495 Sick sinus syndrome: Secondary | ICD-10-CM

## 2022-01-24 LAB — CUP PACEART REMOTE DEVICE CHECK
Battery Impedance: 1336 Ohm
Battery Remaining Longevity: 58 mo
Battery Voltage: 2.78 V
Brady Statistic AP VP Percent: 2 %
Brady Statistic AP VS Percent: 96 %
Brady Statistic AS VP Percent: 0 %
Brady Statistic AS VS Percent: 2 %
Date Time Interrogation Session: 20230713120513
Implantable Lead Implant Date: 20150403
Implantable Lead Implant Date: 20150403
Implantable Lead Location: 753859
Implantable Lead Location: 753860
Implantable Lead Model: 5076
Implantable Lead Model: 5076
Implantable Pulse Generator Implant Date: 20150403
Lead Channel Impedance Value: 500 Ohm
Lead Channel Impedance Value: 638 Ohm
Lead Channel Pacing Threshold Amplitude: 0.5 V
Lead Channel Pacing Threshold Amplitude: 0.625 V
Lead Channel Pacing Threshold Pulse Width: 0.4 ms
Lead Channel Pacing Threshold Pulse Width: 0.4 ms
Lead Channel Setting Pacing Amplitude: 1.5 V
Lead Channel Setting Pacing Amplitude: 2 V
Lead Channel Setting Pacing Pulse Width: 0.4 ms
Lead Channel Setting Sensing Sensitivity: 2.8 mV

## 2022-02-11 NOTE — Progress Notes (Signed)
Remote pacemaker transmission.   

## 2022-04-15 ENCOUNTER — Ambulatory Visit: Payer: PPO | Attending: Cardiology | Admitting: Cardiology

## 2022-04-15 ENCOUNTER — Encounter: Payer: Self-pay | Admitting: Cardiology

## 2022-04-15 VITALS — BP 131/77 | HR 70 | Ht 72.0 in | Wt 202.0 lb

## 2022-04-15 DIAGNOSIS — I495 Sick sinus syndrome: Secondary | ICD-10-CM

## 2022-04-15 DIAGNOSIS — Z950 Presence of cardiac pacemaker: Secondary | ICD-10-CM | POA: Diagnosis not present

## 2022-04-15 DIAGNOSIS — I10 Essential (primary) hypertension: Secondary | ICD-10-CM

## 2022-04-15 DIAGNOSIS — I4892 Unspecified atrial flutter: Secondary | ICD-10-CM

## 2022-04-15 DIAGNOSIS — E785 Hyperlipidemia, unspecified: Secondary | ICD-10-CM

## 2022-04-15 DIAGNOSIS — Z951 Presence of aortocoronary bypass graft: Secondary | ICD-10-CM

## 2022-04-15 DIAGNOSIS — I2510 Atherosclerotic heart disease of native coronary artery without angina pectoris: Secondary | ICD-10-CM

## 2022-04-15 DIAGNOSIS — E1169 Type 2 diabetes mellitus with other specified complication: Secondary | ICD-10-CM

## 2022-04-15 DIAGNOSIS — I4729 Other ventricular tachycardia: Secondary | ICD-10-CM

## 2022-04-15 MED ORDER — NITROGLYCERIN 0.4 MG SL SUBL: 0.4000 mg | SUBLINGUAL_TABLET | SUBLINGUAL | 4 refills | Status: AC | PRN

## 2022-04-15 NOTE — Progress Notes (Signed)
Primary Care Provider: Merrilee Seashore, [place] Cardiologist: Glenetta Hew, MD Electrophysiologist: None  Clinic Note: Chief Complaint  Patient presents with   Follow-up    1 year.  Doing well.   Coronary Artery Disease    No angina. 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   Atrial Fibrillation    Low burden by PPM.->  As a result, not on DOAC.   ===================================  ASSESSMENT/PLAN   Problem List Items Addressed This Visit       Cardiology Problems   Coronary artery disease involving native coronary artery of native heart without angina pectoris (Chronic)    Distant history of CABG back in 2005.  SVG to OM1 is known to be occluded.  No further cath in 2005.  Has been having routine stress test up until 2021 with no symptoms.  We can discuss potentially rechecking a stress test in 2025 for surveillance, but otherwise follow-up for now.  Plan: Doing well on lower dose of beta-blocker.  Energy improved.  (Also, PPM is not noticing any sustained arrhythmias) On moderate dose losartan and 40 mg rosuvastatin. Appropriately on Jardiance in addition to glimepiride for diabetes. On maintenance dose aspirin and with no recurrent breakthrough episodes of A-fib is on aspirin alone. Okay to hold aspirin 5 days preop for surgical procedure.      Relevant Medications   nitroGLYCERIN (NITROSTAT) 0.4 MG SL tablet   Essential hypertension (Chronic)    BP looks good on current meds.  Erik Lane is only on modest dose losartan and low-dose Lopressor.      Relevant Medications   nitroGLYCERIN (NITROSTAT) 0.4 MG SL tablet   Hyperlipidemia associated with type 2 diabetes mellitus (HCC) (Chronic)    I do not have recent lipid panel, but for the last year or so lipids have looked pretty good.  Most recent LDL I see is from a year and a half ago and it was 65.  Labs are presently every  followed by PCP bilateral have those labs available.  Continue current dose of rosuvastatin. For diabetes Erik Lane is on glimepiride and Jardiance.  Defer management to PCP.      Relevant Medications   Empagliflozin (JARDIANCE PO)   nitroGLYCERIN (NITROSTAT) 0.4 MG SL tablet   NSVT (nonsustained ventricular tachycardia) (HCC) (Chronic)    Likely in the setting of ischemia.  No recurrent episodes.  On low-dose beta-blocker.      Relevant Medications   nitroGLYCERIN (NITROSTAT) 0.4 MG SL tablet   Possible paroxysmal atrial flutter (HCC) (Chronic)    Nothing to suggest flutter on pacer evaluations.  Continue to monitor.  Not on DOAC.      Relevant Medications   nitroGLYCERIN (NITROSTAT) 0.4 MG SL tablet   SSS (sick sinus syndrome) (HCC) (Chronic)    Status post pacemaker.  Able to tolerate low-dose beta-blocker.  Energy better with lower dose.      Relevant Medications   nitroGLYCERIN (NITROSTAT) 0.4 MG SL tablet     Other   S/P CABG x 4 : LIMA-LAD, SVG-OM1 (known to be occluded), seq SVG-OM 2-OM 3 (Chronic)    No ischemic symptoms.  Would not be due for follow-up nuclear stress test till 2025.      Status post cardiac pacemaker procedure - Primary (Chronic)    ===================================  HPI:    Erik Lane is a 71 y.o. male with a PMH below  who presents today for annual follow-up at the request of Merrilee Seashore, MD.  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 Ischemia vs. Infarct or artifact CARDIAC CATH 2007 => Occluded SVG-OM1 & patent LIMA-LAD, SeqSVG-OM2-OM3.  MYOVIEW Nov 2021: Low Normal EF 51%. Small anterolateral mostly fixed defect - c/w 2016, 2018 & 2019.  Erik Lane was last seen on 03/20/2021 --> Erik Lane was doing well with no major changes since last visit.  Still active with dancing and yard work.  Erik Lane was seen in the interim on May 18 by Sanda Klein,  MD to follow-up his pacemaker.  They turned his rate responsiveness up because of fatigue but otherwise was doing well.  Recent Hospitalizations: None  Reviewed  CV studies:    The following studies were reviewed today: (if available, images/films reviewed: From Epic Chart or Care Everywhere) No new studies besides pacemaker check:  Interval History:   Erik Lane returns today doing well.  No major issues.  No real cardiac symptoms.  Erik Lane still goes on dancing (square dancing, line dancing and "just dance" ) on Thursdays through Saturdays.  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    Interestingly, Erik Lane is able to dance and do yard work without any difficulty or any shortness of breath or chest tightness or pressure.  However, when Erik Lane is trying to walk briskly up stairs or up a hill Erik Lane may get short of breath, but Erik Lane thinks a lot of this has to do with his knee bothering him.  With the exercise Erik Lane does mostly some walking on days where Erik Lane is not doing dancing, Erik Lane denies any real chest pain or pressure.  Again only some exertional dyspnea if Erik Lane rushes upstairs or uphill.  Erik Lane denies any real PND, orthopnea or edema.  No rapid irregular or forceful heartbeat/palpitations.  Nothing to suggest tachyarrhythmias.  Exercise tolerance notably improved (pacemaker rate response sensor was turned up this summer.  Erik Lane is working hard on trying to lose weight is down from 212 lb -indicating that Erik Lane is lost about 10 lb. Erik Lane seems to think that a lot of the weight loss is related to him having started Jardiance.  Regardless, Erik Lane is happy that Erik Lane is taking the 25 mg dose and feeling well.  REVIEWED OF SYSTEMS   Review of Systems  Constitutional:  Positive for weight loss. Negative for malaise/fatigue.  HENT:  Negative for congestion and nosebleeds.   Respiratory:  Positive for shortness of breath (Exertional-Per HPI.). Negative for cough.    Cardiovascular:  Positive for leg swelling. Negative for claudication.       Per HPI  Genitourinary:  Negative for dysuria and hematuria.  Musculoskeletal:  Positive for joint pain (Mild OA pains.). Negative for falls and myalgias.  Neurological:  Negative for dizziness and focal weakness.  Psychiatric/Behavioral: Negative.     I have reviewed and (if needed) personally updated the patient's problem list, medications, allergies, past medical and surgical history, social and family history.   PAST MEDICAL HISTORY   Past Medical History:  Diagnosis Date   Abnormal nuclear stress test March 2007; March 2016   for DOT physical/CDL license: Inferolateral ischemia - Considered False-Positive, but cath showed 100% SVG-OM occlusion; b.medium size, moderate intensity partially reversible anterolateral defect c/w prior MI & per-infarct ischemia, normal wall motion& EF - considered c/w  anatomy.   CAD (coronary artery disease), autologous vein bypass graft February 2007   Occluded SVG-OM1 with patent LIMA-LAD & seq SVG-OM2-3   CAD in native artery 2005   multivessel CAD, ostial 50-60% LAD, mid LAD 75-80% after D1; OM 2 with 50-60% followed by 60% after OM1. RCA small vessel. --> CABG referral   COVID-19 virus infection 06/2019   Had some respiratory symptoms, but mild case.   Dyslipidemia, goal LDL below 70    on statin   Essential hypertension    Hernia of abdominal cavity    Mild mitral regurgitation by prior echocardiogram 04/2013   Echo: EF 55-60%, G1 DD, mild MR   Paroxysmal atrial fibrillation (HCC)    no recent documentation, although frequent mode switches noted but less than 2 minutes interval.   S/P CABG x 4 2005   LIMA-LAD, SVG-OM1, seq SVG-OM 2-OM-3   Status post cardiac pacemaker procedure 1996, 2003; 10-2013   Medtronic Adapta - replaced 10/2013   Symptomatic sinus bradycardia 1996   status post pacemaker placement, exchanged in 2003 (Medtronic Sigma, in DDDR)    PAST SURGICAL  HISTORY   Past Surgical History:  Procedure Laterality Date   APPENDECTOMY     CARDIAC CATHETERIZATION  01/17/2004   multivessel disease with osital 50-60% LAD, mid 70-80% after first diagonal, Cfx and OM2 had 50-60% lesion, 60% lesion after OM1 (Dr. Rockne Menghini)   CARDIAC CATHETERIZATION  09/16/2005   SVG to OM1 occluded, other grafts patent, patent RCA, normal LV systolic function (Dr. Loni Muse. Little)   CORONARY ARTERY BYPASS GRAFT  01/24/2004   LIMA-LAD, SVG-OM1, SVG-OM2/OM3 - Dr. Prescott Gum   GENERATOR REMOVAL  10/14/2013   Procedure: GENERATOR REMOVAL;  Surgeon: Sanda Klein, MD;  Location: [place] CATH LAB;  Service: Cardiovascular;;   HERNIA REPAIR     NM MYOCAR PERF WALL MOTION  08/2005   mod inferolateral ischemia seen at apex and mid ventricular level, EF 62%   NM MYOCAR PERF WALL MOTION  August 2017, December 2019   a) LOW RISK. EF 53%. Post CABG septal wall hypokinesis. There is a small sized mild severity defect in the mid anterolateral wall with no ischemia. Likely related to prior infarct (occluded SVG-OM1). Consistent with prior study.;; b) EF 50 to 54%.  Small area of inferolateral ischemia and basal level.  Report as ischemia -more c/w infarction.  Read as intermediate but interpreted as low [place] ; 2003; 10-14-13   symptomatic bradycardia. generator change 2003 (Medtronic Sigma) DDDR; left sided device explanted 10-14-13 with new pacing system implatned on right side - MDT ADDRL1 by Dr Sallyanne Kuster   TRANSTHORACIC ECHOCARDIOGRAM  1996; 04/2013   normal LV function, mild LA dilatation;; b) 10/'14 - EF 55-60%, Gr 1 DD, mild LA dilation, Mild MR    Immunization History  Administered Date(s) Administered   Influenza, High Dose Seasonal PF 03/17/2017   PFIZER(Purple Top)SARS-COV-2 Vaccination 11/01/2019, 11/22/2019, 05/23/2020    MEDICATIONS/ALLERGIES   Current Meds  Medication Sig   aspirin EC 81 MG tablet Take 1 tablet (81 mg total) by mouth daily.   Empagliflozin  (JARDIANCE PO) Take 1 tablet by mouth daily in the afternoon.   glimepiride (AMARYL) 2 MG tablet Take 2 mg by mouth every morning. Pt takes 1.5 tablets in morning   Lancets (ONETOUCH DELICA PLUS [id number]) MISC TEST EVERY DAY AS DIRECTED   losartan (COZAAR) 50 MG tablet TAKE 1 TABLET BY MOUTH EVERY DAY   metFORMIN (GLUCOPHAGE-XR) 500  MG 24 hr tablet Take 1,000 mg by mouth 2 (two) times daily.   metoprolol tartrate (LOPRESSOR) 25 MG tablet TAKE 0.5 TABLETS BY MOUTH 2 TIMES DAILY.   ONETOUCH VERIO test strip daily.   rosuvastatin (CRESTOR) 40 MG tablet TAKE 1 TABLET BY MOUTH EVERY DAY    Allergies  Allergen Reactions   Ozempic (0.25 Or 0.5 Mg-Dose) [Semaglutide(0.25 Or 0.'5mg'$ -Dos)] Nausea And Vomiting    SOCIAL HISTORY/FAMILY HISTORY   Reviewed in Epic:  Pertinent findings:  Social History   Tobacco Use   Smoking status: Never   Smokeless tobacco: Never  Vaping Use   Vaping Use: Never used  Substance Use Topics   Alcohol use: No   Drug use: No   Social History   Social History Narrative   Erik Lane is a father of 2, grandfather 58. Erik Lane does chew tobacco on daily basis but quit smoking 12 years ago. Never drinks a fall. 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    OBJCTIVE -PE, EKG, labs   Wt Readings from Last 3 Encounters:  04/15/22 202 lb (91.6 kg)  11/28/21 210 lb 9.6 oz (95.5 kg)  03/20/21 212 lb 3.2 oz (96.3 kg)    Physical Exam: BP 131/77 (BP Location: Left Arm, Patient Position: Sitting, Cuff Size: Normal)   Pulse 70   Ht 6' (1.829 m)   Wt 202 lb (91.6 kg)   SpO2 93%   BMI 27.40 kg/m  Physical Exam Vitals reviewed.  Constitutional:      General: Erik Lane is not in acute distress.    Appearance: Normal appearance. Erik Lane is normal weight. Erik Lane is not ill-appearing or toxic-appearing.  HENT:     Head: Normocephalic and atraumatic.  Neck:     Vascular: No carotid bruit or JVD.  Cardiovascular:     Rate  and Rhythm: Normal rate and regular rhythm. No extrasystoles are present.    Chest Wall: PMI is not displaced.     Pulses: Intact distal pulses. Decreased pulses (Diminished pedal pulses bilaterally-1+ foot feet are warm).     Heart sounds: Heart sounds are distant. No murmur heard.    No friction rub. No gallop.     Comments: Normal S1, split S2 Pulmonary:     Effort: Pulmonary effort is normal. No respiratory distress.     Breath sounds: Normal breath sounds. No wheezing or rales.  Chest:     Chest wall: No tenderness.  Abdominal:     General: Abdomen is flat. Bowel sounds are normal. There is no distension.     Palpations: Abdomen is soft.     Tenderness: There is no abdominal tenderness. There is no guarding or rebound.     Comments: No HSM or bruit  Musculoskeletal:        General: No swelling. Normal range of motion.     Cervical back: Normal range of motion and neck supple.  Skin:    General: Skin is warm and dry.     Findings: Bruising present.  Neurological:     General: No focal deficit present.     Mental Status: Erik Lane is alert and oriented to person, place, and time.     Gait: Gait normal.  Psychiatric:        Mood and Affect: Mood normal.        Behavior: Behavior normal.        Thought Content: Thought content normal.  Judgment: Judgment normal.    Adult ECG Report N/a  Recent Labs:  01/04/2021: TC 151, HDL 38.  (Most recent LDL reported was 09/21/2020 at 65).  TG was from 01/10/2022 at 135.  Unfortunately I do not have the complete lab results from PCP.   Lab Results  Component Value Date   CHOL 154 07/28/2017   HDL 43 07/28/2017   LDLCALC 88 07/28/2017   TRIG 116 07/28/2017   CHOLHDL 3.6 07/28/2017   Lab Results  Component Value Date   CREATININE 0.82 07/28/2017   BUN 15 07/28/2017   NA 142 07/28/2017   K 4.5 07/28/2017   CL 105 07/28/2017   CO2 23 07/28/2017      Latest Ref Rng & Units 01/05/2015    8:07 PM 10/10/2013   12:08 PM 06/13/2011    12:52 AM  CBC  WBC 4.0 - 10.5 K/uL 7.6  8.8  10.8   Hemoglobin 13.0 - 17.0 g/dL 14.1  15.2  15.4   Hematocrit 39.0 - 52.0 % 41.8  44.8  45.3   Platelets 150 - 400 K/uL 155  176  175     No results found for: "HGBA1C" No results found for: "TSH"  ================================================== I spent a total of 40mnutes with the patient spent in direct patient consultation.  Additional time spent with chart review  / charting (studies, outside notes, etc): 14 min Total Time: 32  min  Current medicines are reviewed at length with the patient today.  (+/- concerns) none  Notice: This dictation was prepared with Dragon dictation along with smart phrase technology. Any transcriptional errors that result from this process are unintentional and may not be corrected upon review.  Studies Ordered:   No orders of the defined types were placed in this encounter.  Meds ordered this encounter  Medications   nitroGLYCERIN (NITROSTAT) 0.4 MG SL tablet    Sig: Place 1 tablet (0.4 mg total) under the tongue every 15 (fifteen) minutes as needed.    Dispense:  25 tablet    Refill:  4    Patient Instructions / Medication Changes & Studies & Tests Ordered   Patient Instructions  Medication Instructions:   No changes *If you need a refill on your cardiac medications before your next appointment, please call your pharmacy*   Lab Work: Not needed    Testing/Procedures: Not needed   Follow-Up: At [facility] you and your health needs are our priority.  As part of our continuing mission to provide you with exceptional heart care, we have created designated Provider Care Teams.  These Care Teams include your primary Cardiologist (physician) and Advanced Practice Providers (APPs -  Physician Assistants and Nurse Practitioners) who all work together to provide you with the care you need, when you need it.     Your next appointment:   12 month(s)  The format for your next  appointment:   In Person  Provider:   DGlenetta Hew MD         DLeonie Man MD, MS DGlenetta Hew M.D., M.S. Interventional Cardiologist  [facility] Pager # [phone] # [phone] [address] [place] [id number]  Thank you for choosing [facility]!

## 2022-04-15 NOTE — Patient Instructions (Signed)

## 2022-04-23 ENCOUNTER — Ambulatory Visit (INDEPENDENT_AMBULATORY_CARE_PROVIDER_SITE_OTHER): Payer: PPO

## 2022-04-23 DIAGNOSIS — I495 Sick sinus syndrome: Secondary | ICD-10-CM

## 2022-04-23 LAB — CUP PACEART REMOTE DEVICE CHECK
Battery Impedance: 1389 Ohm
Battery Remaining Longevity: 57 mo
Battery Voltage: 2.78 V
Brady Statistic AP VP Percent: 1 %
Brady Statistic AP VS Percent: 96 %
Brady Statistic AS VP Percent: 0 %
Brady Statistic AS VS Percent: 2 %
Date Time Interrogation Session: 20231011113849
Implantable Lead Implant Date: 20150403
Implantable Lead Implant Date: 20150403
Implantable Lead Location: 753859
Implantable Lead Location: 753860
Implantable Lead Model: 5076
Implantable Lead Model: 5076
Implantable Pulse Generator Implant Date: 20150403
Lead Channel Impedance Value: 493 Ohm
Lead Channel Impedance Value: 638 Ohm
Lead Channel Pacing Threshold Amplitude: 0.5 V
Lead Channel Pacing Threshold Amplitude: 0.625 V
Lead Channel Pacing Threshold Pulse Width: 0.4 ms
Lead Channel Pacing Threshold Pulse Width: 0.4 ms
Lead Channel Setting Pacing Amplitude: 1.5 V
Lead Channel Setting Pacing Amplitude: 2 V
Lead Channel Setting Pacing Pulse Width: 0.4 ms
Lead Channel Setting Sensing Sensitivity: 2 mV

## 2022-04-25 ENCOUNTER — Other Ambulatory Visit: Payer: Self-pay | Admitting: Cardiology

## 2022-05-04 ENCOUNTER — Encounter: Payer: Self-pay | Admitting: Cardiology

## 2022-05-04 NOTE — Assessment & Plan Note (Signed)
Nothing to suggest flutter on pacer evaluations.  Continue to monitor.  Not on DOAC.

## 2022-05-04 NOTE — Assessment & Plan Note (Signed)
I do not have recent lipid panel, but for the last year or so lipids have looked pretty good.  Most recent LDL I see is from a year and a half ago and it was 65.  Labs are presently every followed by PCP bilateral have those labs available.  Continue current dose of rosuvastatin. For diabetes he is on glimepiride and Jardiance.  Defer management to PCP.

## 2022-05-04 NOTE — Assessment & Plan Note (Signed)
Likely in the setting of ischemia.  No recurrent episodes.  On low-dose beta-blocker.

## 2022-05-04 NOTE — Assessment & Plan Note (Signed)
Distant history of CABG back in 2005.  SVG to OM1 is known to be occluded.  No further cath in 2005.  Has been having routine stress test up until 2021 with no symptoms.  We can discuss potentially rechecking a stress test in 2025 for surveillance, but otherwise follow-up for now.  Plan:  Doing well on lower dose of beta-blocker.  Energy improved.  (Also, PPM is not noticing any sustained arrhythmias)  On moderate dose losartan and 40 mg rosuvastatin.  Appropriately on Jardiance in addition to glimepiride for diabetes.  On maintenance dose aspirin and with no recurrent breakthrough episodes of A-fib is on aspirin alone.  Okay to hold aspirin 5 days preop for surgical procedure.

## 2022-05-04 NOTE — Assessment & Plan Note (Signed)
No ischemic symptoms.  Would not be due for follow-up nuclear stress test till 2025.

## 2022-05-04 NOTE — Assessment & Plan Note (Signed)
BP looks good on current meds.  He is only on modest dose losartan and low-dose Lopressor.

## 2022-05-04 NOTE — Assessment & Plan Note (Signed)
Status post pacemaker.  Able to tolerate low-dose beta-blocker.  Energy better with lower dose.

## 2022-05-06 NOTE — Progress Notes (Signed)
Remote pacemaker transmission.   

## 2022-07-19 ENCOUNTER — Other Ambulatory Visit: Payer: Self-pay | Admitting: Cardiology

## 2022-07-20 ENCOUNTER — Other Ambulatory Visit: Payer: Self-pay | Admitting: Cardiology

## 2022-07-23 ENCOUNTER — Ambulatory Visit (INDEPENDENT_AMBULATORY_CARE_PROVIDER_SITE_OTHER): Payer: PPO

## 2022-07-23 DIAGNOSIS — I495 Sick sinus syndrome: Secondary | ICD-10-CM

## 2022-07-23 LAB — CUP PACEART REMOTE DEVICE CHECK
Battery Impedance: 1415 Ohm
Battery Remaining Longevity: 56 mo
Battery Voltage: 2.77 V
Brady Statistic AP VP Percent: 1 %
Brady Statistic AP VS Percent: 97 %
Brady Statistic AS VP Percent: 0 %
Brady Statistic AS VS Percent: 2 %
Date Time Interrogation Session: 20240110104332
Implantable Lead Connection Status: 753985
Implantable Lead Connection Status: 753985
Implantable Lead Implant Date: 20150403
Implantable Lead Implant Date: 20150403
Implantable Lead Location: 753859
Implantable Lead Location: 753860
Implantable Lead Model: 5076
Implantable Lead Model: 5076
Implantable Pulse Generator Implant Date: 20150403
Lead Channel Impedance Value: 532 Ohm
Lead Channel Impedance Value: 675 Ohm
Lead Channel Pacing Threshold Amplitude: 0.625 V
Lead Channel Pacing Threshold Amplitude: 0.625 V
Lead Channel Pacing Threshold Pulse Width: 0.4 ms
Lead Channel Pacing Threshold Pulse Width: 0.4 ms
Lead Channel Setting Pacing Amplitude: 1.5 V
Lead Channel Setting Pacing Amplitude: 2 V
Lead Channel Setting Pacing Pulse Width: 0.4 ms
Lead Channel Setting Sensing Sensitivity: 2.8 mV
Zone Setting Status: 755011
Zone Setting Status: 755011

## 2022-07-30 ENCOUNTER — Other Ambulatory Visit: Payer: Self-pay | Admitting: Cardiology

## 2022-08-14 NOTE — Progress Notes (Signed)
Remote pacemaker transmission.   

## 2022-09-26 LAB — LAB REPORT - SCANNED: A1c: 7.2

## 2022-11-05 ENCOUNTER — Ambulatory Visit (INDEPENDENT_AMBULATORY_CARE_PROVIDER_SITE_OTHER): Payer: PPO

## 2022-11-05 DIAGNOSIS — I495 Sick sinus syndrome: Secondary | ICD-10-CM | POA: Diagnosis not present

## 2022-11-05 LAB — CUP PACEART REMOTE DEVICE CHECK
Battery Impedance: 1533 Ohm
Battery Remaining Longevity: 52 mo
Battery Voltage: 2.78 V
Brady Statistic AP VP Percent: 1 %
Brady Statistic AP VS Percent: 97 %
Brady Statistic AS VP Percent: 0 %
Brady Statistic AS VS Percent: 2 %
Date Time Interrogation Session: 20240424123919
Implantable Lead Connection Status: 753985
Implantable Lead Connection Status: 753985
Implantable Lead Implant Date: 20150403
Implantable Lead Implant Date: 20150403
Implantable Lead Location: 753859
Implantable Lead Location: 753860
Implantable Lead Model: 5076
Implantable Lead Model: 5076
Implantable Pulse Generator Implant Date: 20150403
Lead Channel Impedance Value: 494 Ohm
Lead Channel Impedance Value: 634 Ohm
Lead Channel Pacing Threshold Amplitude: 0.5 V
Lead Channel Pacing Threshold Amplitude: 0.625 V
Lead Channel Pacing Threshold Pulse Width: 0.4 ms
Lead Channel Pacing Threshold Pulse Width: 0.4 ms
Lead Channel Setting Pacing Amplitude: 1.5 V
Lead Channel Setting Pacing Amplitude: 2 V
Lead Channel Setting Pacing Pulse Width: 0.4 ms
Lead Channel Setting Sensing Sensitivity: 2 mV
Zone Setting Status: 755011
Zone Setting Status: 755011

## 2022-12-01 ENCOUNTER — Ambulatory Visit: Payer: PPO | Attending: Cardiovascular Disease | Admitting: Cardiovascular Disease

## 2022-12-01 ENCOUNTER — Encounter: Payer: Self-pay | Admitting: Cardiovascular Disease

## 2022-12-01 ENCOUNTER — Telehealth: Payer: Self-pay | Admitting: Emergency Medicine

## 2022-12-01 VITALS — BP 142/81 | HR 68 | Ht 73.0 in | Wt 195.8 lb

## 2022-12-01 DIAGNOSIS — I2510 Atherosclerotic heart disease of native coronary artery without angina pectoris: Secondary | ICD-10-CM | POA: Diagnosis not present

## 2022-12-01 DIAGNOSIS — Z794 Long term (current) use of insulin: Secondary | ICD-10-CM

## 2022-12-01 DIAGNOSIS — I10 Essential (primary) hypertension: Secondary | ICD-10-CM

## 2022-12-01 DIAGNOSIS — E785 Hyperlipidemia, unspecified: Secondary | ICD-10-CM

## 2022-12-01 DIAGNOSIS — R42 Dizziness and giddiness: Secondary | ICD-10-CM

## 2022-12-01 DIAGNOSIS — I495 Sick sinus syndrome: Secondary | ICD-10-CM | POA: Diagnosis not present

## 2022-12-01 DIAGNOSIS — I4729 Other ventricular tachycardia: Secondary | ICD-10-CM

## 2022-12-01 DIAGNOSIS — E1169 Type 2 diabetes mellitus with other specified complication: Secondary | ICD-10-CM

## 2022-12-01 DIAGNOSIS — Z950 Presence of cardiac pacemaker: Secondary | ICD-10-CM | POA: Diagnosis not present

## 2022-12-01 DIAGNOSIS — E119 Type 2 diabetes mellitus without complications: Secondary | ICD-10-CM

## 2022-12-01 DIAGNOSIS — Z7985 Long-term (current) use of injectable non-insulin antidiabetic drugs: Secondary | ICD-10-CM

## 2022-12-01 NOTE — Progress Notes (Signed)
Cardiology Office Note    Date:  12/01/2022   ID:  XZAYVIER LENNERT, DOB [birth year], MRN [id number]  PCP:  Georgianne Fick, MD  Cardiologist:  Bryan Lemma, M.D.; Thurmon Fair, MD   Chief Complaint  Patient presents with   Pacemaker Check    History of Present Illness:  Erik Lane is a 72 y.o. male with an extensive history of coronary artery disease and previous bypass surgery, here for pacemaker check. His device is a dual-chamber Medtronic Adapta implanted April 2015 for sinus node dysfunction.  He is doing quite well.  His only complaint is frequent problems with orthostatic dizziness.  He reports that this began after he received a pneumonia vaccination, but is now persistent for several months.  He has not had falls or syncope.  Denies palpitations.  No lower extremity edema, angina or dyspnea at rest or with activity.  He is now on Jardiance in addition to glimepiride and metformin for diabetes mellitus.  Pacemaker interrogation shows normal device function.  He has a Medtronic Adapta device implanted in the right subclavian area with an estimated remaining generator longevity of 4.5 years.  Both the atrial and ventricular leads are 5076 leads.  He has 97% atrial pacing with good heart rate histogram distribution and only 1.2% ventricular pacing.  He has not had any episodes of high ventricular rates.  He has rare episodes of atrial mode switch all of them less than 30 seconds in duration.  He has an abandoned pacemaker system with unipolar leads on the left side.  Improve metabolic control.  Reports most recent hemoglobin A1c was 7.2% in March.  I do not have his most recent lipid profile but he tells me that Dr. Nicholos Johns was happy with the results.  Will ask for a copy of those tests.  He originally received a pacemaker in 1996 (unipolar leads) sinus node dysfunction with intermittent AV block and had a generator change in 2003.  Unipolar lead "noise" began  interfering with device function with numerous episodes of mode switch due to artifact.  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  The old pacemaker was deactivated but left in place and he received an entirely new system in the right subclavian area in 2015 with new bipolar leads.  Ever since the new system was implanted we have not recorded any "atrial flutter" or "atrial fibrillation" as reported on his old unipolar leads.   Past Medical History:  Diagnosis Date   Abnormal nuclear stress test March 2007; March 2016   for DOT physical/CDL license: Inferolateral ischemia - Considered False-Positive, but cath showed 100% SVG-OM occlusion; b.medium size, moderate intensity partially reversible anterolateral defect c/w prior MI & per-infarct ischemia, normal wall motion& EF - considered c/w anatomy.   CAD (coronary artery disease), autologous vein bypass graft February 2007   Occluded SVG-OM1 with patent LIMA-LAD & seq SVG-OM2-3   CAD in native artery 2005   multivessel CAD, ostial 50-60% LAD, mid LAD 75-80% after D1; OM 2 with 50-60% followed by 60% after OM1. RCA small vessel. --> CABG referral   COVID-19 virus infection 06/2019   Had some respiratory symptoms, but mild case.   Dyslipidemia, goal LDL below 70    on statin   Essential hypertension    Hernia of abdominal cavity    Mild mitral regurgitation by prior echocardiogram 04/2013   Echo: EF 55-60%, G1 DD, mild MR   Paroxysmal atrial fibrillation (HCC)    no  recent documentation, although frequent mode switches noted but less than 2 minutes interval.   S/P CABG x 4 2005   LIMA-LAD, SVG-OM1, seq SVG-OM 2-OM-3   Status post cardiac pacemaker procedure 1996, 2003; 10-2013   Medtronic Adapta - replaced 10/2013   Symptomatic sinus bradycardia 1996   status post pacemaker placement, exchanged in 2003 (Medtronic Sigma, in DDDR)    Past Surgical History:  Procedure Laterality Date   APPENDECTOMY     CARDIAC  CATHETERIZATION  01/17/2004   multivessel disease with osital 50-60% LAD, mid 70-80% after first diagonal, Cfx and OM2 had 50-60% lesion, 60% lesion after OM1 (Dr. Langston Reusing)   CARDIAC CATHETERIZATION  09/16/2005   SVG to OM1 occluded, other grafts patent, patent RCA, normal LV systolic function (Dr. Mervyn Skeeters. [place])   CORONARY ARTERY BYPASS GRAFT  01/24/2004   LIMA-LAD, SVG-OM1, SVG-OM2/OM3 - Dr. Donata Clay   GENERATOR REMOVAL  10/14/2013   Procedure: GENERATOR REMOVAL;  Surgeon: Thurmon Fair, MD;  Location: [facility]ATH [facility];  Service: Cardiovascular;;   HERNIA REPAIR     NM MYOCAR PERF WALL MOTION  08/2005   mod inferolateral ischemia seen at apex and mid ventricular level, EF 62%   NM MYOCAR PERF WALL MOTION  August 2017, December 2019   a) LOW RISK. EF 53%. Post CABG septal wall hypokinesis. There is a small sized mild severity defect in the mid anterolateral wall with no ischemia. Likely related to prior infarct (occluded SVG-OM1). Consistent with prior study.;; b) EF 50 to 54%.  Small area of inferolateral ischemia and basal level.  Report as ischemia -more c/w infarction.  Read as intermediate but interpreted as low RIK   PACEMAKER INSERTION  1996 ; 2003; 10-14-13   symptomatic bradycardia. generator change 2003 (Medtronic Sigma) DDDR; left sided device explanted 10-14-13 with new pacing system implatned on right side - MDT ADDRL1 by Dr Royann Shivers   TRANSTHORACIC ECHOCARDIOGRAM  1996; 04/2013   normal LV function, mild LA dilatation;; b) 10/'14 - EF 55-60%, Gr 1 DD, mild LA dilation, Mild MR    Current Medications: Outpatient Medications Prior to Visit  Medication Sig Dispense Refill   aspirin EC 81 MG tablet Take 1 tablet (81 mg total) by mouth daily. 90 tablet 3   Empagliflozin (JARDIANCE PO) Take 1 tablet by mouth daily in the afternoon.     glimepiride (AMARYL) 2 MG tablet Take 2 mg by mouth every morning. Pt takes 1.5 tablets in morning     Lancets (ONETOUCH DELICA PLUS LANCET33G) MISC TEST EVERY DAY AS  DIRECTED     losartan (COZAAR) 50 MG tablet TAKE 1 TABLET BY MOUTH EVERY DAY 90 tablet 3   metFORMIN (GLUCOPHAGE-XR) 500 MG 24 hr tablet Take 1,000 mg by mouth 2 (two) times daily.     metoprolol tartrate (LOPRESSOR) 25 MG tablet TAKE 1/2 TABLET BY MOUTH TWICE A DAY 90 tablet 3   ONETOUCH VERIO test strip daily.     rosuvastatin (CRESTOR) 40 MG tablet TAKE 1 TABLET BY MOUTH EVERY DAY 90 tablet 3   nitroGLYCERIN (NITROSTAT) 0.4 MG SL tablet Place 1 tablet (0.4 mg total) under the tongue every 15 (fifteen) minutes as needed. (Patient not taking: Reported on 12/01/2022) 25 tablet 4   scopolamine (TRANSDERM-SCOP) 1 MG/3DAYS 1 PATCH TO SKIN BEHIND THE EAR AS NEEDED TRANSDERMAL 2 DAYS (Patient not taking: Reported on 12/01/2022)     No facility-administered medications prior to visit.     Allergies:   Ozempic (0.25 or 0.5 mg-dose) [  semaglutide(0.25 or 0.5mg -dos)]   Social History   Socioeconomic History   Marital status: Widowed    Spouse name: Not on file   Number of children: 2   Years of education: Not on file   Highest education level: Not on file  Occupational History    Employer: [employer]  Tobacco Use   Smoking status: Never   Smokeless tobacco: Never  Vaping Use   Vaping Use: Never used  Substance and Sexual Activity   Alcohol use: No   Drug use: No   Sexual activity: Not on file  Other Topics Concern   Not on file  Social History Narrative   He is a father of 2, grandfather 4. He does chew tobacco on daily basis but quit smoking 12 years ago. Never drinks a fall. 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   Social Determinants of Health   Financial Resource Strain: Not on file  Food Insecurity: Not on file  Transportation Needs: Not on file  Physical Activity: Not on file  Stress: Not on file  Social Connections: Not on file     Family History:  The patient's family history includes Heart  attack in his brother and father; Heart disease in his father; Heart failure in his father.   ROS:   Please see the history of present illness.    ROS All other systems are reviewed and are negative.   PHYSICAL EXAM:   VS:  BP (!) 142/81 (BP Location: Left Arm, Patient Position: Sitting, Cuff Size: Normal)   Pulse 68   Ht 6\' 1"  (1.854 m)   Wt 195 lb 12.8 oz (88.8 kg)   BMI 25.83 kg/m   Wt Readings from Last 3 Encounters:  12/01/22 195 lb 12.8 oz (88.8 kg)  04/15/22 202 lb (91.6 kg)  11/28/21 210 lb 9.6 oz (95.5 kg)     General: Alert, oriented x3, no distress, pacemaker devices are present in both the right and left subclavian area, both areas look healthy Head: no evidence of trauma, PERRL, EOMI, no exophtalmos or lid lag, no myxedema, no xanthelasma; normal ears, nose and oropharynx Neck: normal jugular venous pulsations and no hepatojugular reflux; brisk carotid pulses without delay and no carotid bruits Chest: clear to auscultation, no signs of consolidation by percussion or palpation, normal fremitus, symmetrical and full respiratory excursions Cardiovascular: normal position and quality of the apical impulse, regular rhythm, normal first and second heart sounds, no murmurs, rubs or gallops Abdomen: no tenderness or distention, no masses by palpation, no abnormal pulsatility or arterial bruits, normal bowel sounds, no hepatosplenomegaly Extremities: no clubbing, cyanosis or edema; 2+ radial, ulnar and brachial pulses bilaterally; 2+ right femoral, posterior tibial and dorsalis pedis pulses; 2+ left femoral, posterior tibial and dorsalis pedis pulses; no subclavian or femoral bruits Neurological: grossly nonfocal Psych: Normal mood and affect    Studies/Labs Reviewed:   Lipid Panel     Component Value Date/Time   CHOL 154 07/28/2017 0817   TRIG 116 07/28/2017 0817   HDL 43 07/28/2017 0817   CHOLHDL 3.6 07/28/2017 0817   LDLCALC 88 07/28/2017 0817   LABVLDL 23 07/28/2017  0817   16/07/958 labs Total cholesterol 122, HDL 35, LDL 63, triglycerides 133 Hemoglobin A1c 8.2%, creatinine 0.89, normal liver function tests 09/21/2020 LDL 65, hemoglobin A1c 8.0% 01/04/2021 Cholesterol 151, HDL 38 08/30/2021 triglycerides 155  EKG: ECG is ordered today.  Personally  reviewed.  Atrial paced, ventricular sensed rhythm, chronic right bundle branch block.  No ischemic repolarization abnormalities.  Additional studies/ records that were reviewed today include:  Comprehensive pacemaker check performed by me, with reprogramming.  ASSESSMENT:    1. SSS (sick sinus syndrome) (HCC)   2. NSVT (nonsustained ventricular tachycardia) (HCC)   3. Pacemaker   4. Coronary artery disease involving native coronary artery of native heart without angina pectoris   5. Essential hypertension   6. Hyperlipidemia associated with type 2 diabetes mellitus (HCC)   7. Diabetes mellitus type 2 in nonobese (HCC)   8. Dizziness      PLAN:  In order of problems listed above:  SSS: Good heart rate histogram distribution.  Denies fatigue with activity. NSVT: We have not seen any in the last 24 months or so.  He has a history of relatively long episodes of nonsustained VT and is taking beta-blockers and a very low-dose. PPM: Normal function of the right subclavian pacemaker.  The left sided pacemaker is deactivated (unipolar leads, and occluded subclavian vein on the left).  Continue remote downloads every 3 months. CAD: No angina..  Low risk nuclear stress test December 2019. HTN: Has symptoms suggestive of orthostatic hypotension.  May be hypovolemic due to the use of Jardiance.  Encouraged him to drink more fluids.  If symptoms do not improve, we can reduce the dose of his losartan. HLP: Chronic low HDL.  Request most recent labs from Dr. Renne Crigler.  Target LDL cholesterol less than 70. DM: I am very pleased that he is taking SGLT2 inhibitor for his diabetes mellitus.  Needs to drink more  water.  Medication Adjustments/Labs and Tests Ordered: Current medicines are reviewed at length with the patient today.  Concerns regarding medicines are outlined above.  Medication changes, Labs and Tests ordered today are listed in the Patient Instructions below. Patient Instructions  Medication Instructions:  No changes *If you need a refill on your cardiac medications before your next appointment, please call your pharmacy*  Follow-Up: At [facility], you and your health needs are our priority.  As part of our continuing mission to provide you with exceptional heart care, we have created designated Provider Care Teams.  These Care Teams include your primary Cardiologist (physician) and Advanced Practice Providers (APPs -  Physician Assistants and Nurse Practitioners) who all work together to provide you with the care you need, when you need it.  We recommend signing up for the patient portal called "MyChart".  Sign up information is provided on this After Visit Summary.  MyChart is used to connect with patients for Virtual Visits (Telemedicine).  Patients are able to view lab/test results, encounter notes, upcoming appointments, etc.  Non-urgent messages can be sent to your provider as well.   To learn more about what you can do with MyChart, go to [email or web address].    Your next appointment:   1 year(s)  Provider:   Dr Royann Shivers    Signed, Thurmon Fair, MD  12/01/2022 9:28 AM    [facility] [address] [place], [place], [place]  [address] Phone: [phone]; Fax: [phone]

## 2022-12-01 NOTE — Patient Instructions (Signed)
Medication Instructions:  No changes *If you need a refill on your cardiac medications before your next appointment, please call your pharmacy*  Follow-Up: At [facility], you and your health needs are our priority.  As part of our continuing mission to provide you with exceptional heart care, we have created designated Provider Care Teams.  These Care Teams include your primary Cardiologist (physician) and Advanced Practice Providers (APPs -  Physician Assistants and Nurse Practitioners) who all work together to provide you with the care you need, when you need it.  We recommend signing up for the patient portal called "MyChart".  Sign up information is provided on this After Visit Summary.  MyChart is used to connect with patients for Virtual Visits (Telemedicine).  Patients are able to view lab/test results, encounter notes, upcoming appointments, etc.  Non-urgent messages can be sent to your provider as well.   To learn more about what you can do with MyChart, go to [email or web address].    Your next appointment:   1 year(s)  Provider:   Dr Croitoru  

## 2022-12-01 NOTE — Telephone Encounter (Signed)
Fax requesting mutual pt's most recent lab work including lipids.

## 2022-12-02 NOTE — Progress Notes (Signed)
Remote pacemaker transmission.   

## 2023-01-31 NOTE — Progress Notes (Signed)
Lab results: *09/26/2022: Na+ 142, K+ 4.2, Cl- 103, HCO3- 29, BUN 11, Cr 0.95, Glu 96, Ca2+ 8.6; AST 23, ALT 30, AlkP 65; TC 125, TG 123, HDL 43, LDL *57 Great!.;  G9F 7.2 *05/02/2022: CBC: W 7.5, H/H 15.1/46.7, Plt 157; TSH 4.94  Bryan Lemma, MD

## 2023-02-04 ENCOUNTER — Ambulatory Visit (INDEPENDENT_AMBULATORY_CARE_PROVIDER_SITE_OTHER): Payer: PPO

## 2023-02-04 DIAGNOSIS — I495 Sick sinus syndrome: Secondary | ICD-10-CM

## 2023-02-05 LAB — CUP PACEART REMOTE DEVICE CHECK
Battery Impedance: 1713 Ohm
Battery Remaining Longevity: 48 mo
Brady Statistic AP VP Percent: 4 %
Brady Statistic AP VS Percent: 94 %
Brady Statistic AS VP Percent: 0 %
Brady Statistic AS VS Percent: 2 %
Date Time Interrogation Session: 20240724094802
Implantable Lead Connection Status: 753985
Implantable Lead Connection Status: 753985
Implantable Lead Implant Date: 20150403
Implantable Lead Implant Date: 20150403
Implantable Lead Location: 753860
Implantable Lead Model: 5076
Implantable Pulse Generator Implant Date: 20150403
Lead Channel Impedance Value: 503 Ohm
Lead Channel Impedance Value: 642 Ohm
Lead Channel Pacing Threshold Amplitude: 0.625 V
Lead Channel Pacing Threshold Amplitude: 0.625 V
Lead Channel Pacing Threshold Pulse Width: 0.4 ms
Lead Channel Setting Pacing Amplitude: 1.5 V
Lead Channel Setting Sensing Sensitivity: 2.8 mV
Zone Setting Status: 755011
Zone Setting Status: 755011

## 2023-02-17 NOTE — Progress Notes (Signed)
Remote pacemaker transmission.   

## 2023-05-06 ENCOUNTER — Ambulatory Visit (INDEPENDENT_AMBULATORY_CARE_PROVIDER_SITE_OTHER): Payer: PPO

## 2023-05-06 DIAGNOSIS — I495 Sick sinus syndrome: Secondary | ICD-10-CM | POA: Diagnosis not present

## 2023-05-07 LAB — CUP PACEART REMOTE DEVICE CHECK
Battery Impedance: 1755 Ohm
Battery Remaining Longevity: 46 mo
Battery Voltage: 2.76 V
Brady Statistic AP VP Percent: 7 %
Brady Statistic AP VS Percent: 90 %
Brady Statistic AS VP Percent: 1 %
Brady Statistic AS VS Percent: 2 %
Date Time Interrogation Session: 20241023124738
Implantable Lead Connection Status: 753985
Implantable Lead Connection Status: 753985
Implantable Lead Implant Date: 20150403
Implantable Lead Implant Date: 20150403
Implantable Lead Location: 753859
Implantable Lead Location: 753860
Implantable Lead Model: 5076
Implantable Lead Model: 5076
Implantable Pulse Generator Implant Date: 20150403
Lead Channel Impedance Value: 480 Ohm
Lead Channel Impedance Value: 574 Ohm
Lead Channel Pacing Threshold Amplitude: 0.625 V
Lead Channel Pacing Threshold Amplitude: 0.625 V
Lead Channel Pacing Threshold Pulse Width: 0.4 ms
Lead Channel Pacing Threshold Pulse Width: 0.4 ms
Lead Channel Setting Pacing Amplitude: 1.5 V
Lead Channel Setting Pacing Amplitude: 2 V
Lead Channel Setting Pacing Pulse Width: 0.4 ms
Lead Channel Setting Sensing Sensitivity: 2 mV
Zone Setting Status: 755011
Zone Setting Status: 755011

## 2023-05-18 ENCOUNTER — Other Ambulatory Visit: Payer: Self-pay | Admitting: Cardiology

## 2023-05-25 NOTE — Progress Notes (Signed)
Remote pacemaker transmission.   

## 2023-06-01 ENCOUNTER — Ambulatory Visit: Payer: PPO | Admitting: Podiatry

## 2023-06-01 ENCOUNTER — Encounter: Payer: Self-pay | Admitting: Podiatry

## 2023-06-01 DIAGNOSIS — M79675 Pain in left toe(s): Secondary | ICD-10-CM | POA: Diagnosis not present

## 2023-06-01 DIAGNOSIS — E119 Type 2 diabetes mellitus without complications: Secondary | ICD-10-CM

## 2023-06-01 DIAGNOSIS — M79674 Pain in right toe(s): Secondary | ICD-10-CM

## 2023-06-01 DIAGNOSIS — B351 Tinea unguium: Secondary | ICD-10-CM

## 2023-06-01 NOTE — Progress Notes (Signed)
  Subjective:  Patient ID: Erik Lane, male    DOB: Oct 18, 1950,  MRN: [id number]  Chief Complaint  Patient presents with   [facility]    RM#10 DFC-diabetic foot care/nail trim/ thick and discolored one has come off and not grown back/ white stuff under nails as well    72 y.o. male presents with the above complaint. History confirmed with patient.  Here for thickening discolored painful nails on both feet he has controlled diabetes his A1c is 7.5%  Objective:  Physical Exam: warm, good capillary refill, no trophic changes or ulcerative lesions, normal DP and PT pulses, and normal sensory exam. Left Foot: dystrophic yellowed discolored nail plates with subungual debris Right Foot: dystrophic yellowed discolored nail plates with subungual debris   Assessment:   1. Pain due to onychomycosis of toenails of both feet   2. Controlled type 2 diabetes mellitus without complication, without long-term current use of insulin (HCC)   3. Encounter for diabetic foot exam [facility])      Plan:  Patient was evaluated and treated and all questions answered.  Patient educated on diabetes. Discussed proper diabetic foot care and discussed risks and complications of disease. Educated patient in depth on reasons to return to the office immediately should he/she discover anything concerning or new on the feet. All questions answered. Discussed proper shoes as well.  Annual diabetic foot exam performed today.  He is overall low risk due to his good circulation and lack of significant neuropathy.  Discussed the etiology and treatment options for the condition in detail with the patient. Educated patient on the topical and oral treatment options for mycotic nails. Recommended debridement of the nails today. Sharp and mechanical debridement performed of all painful and mycotic nails today. Nails debrided in length and thickness using a nail nipper to level of comfort. Discussed treatment options including appropriate  shoe gear.  He will follow-up in 3 with Dr. Stacie Acres for this    Return in about 3 months (around 09/01/2023) for at risk diabetic foot care.

## 2023-08-05 ENCOUNTER — Ambulatory Visit (INDEPENDENT_AMBULATORY_CARE_PROVIDER_SITE_OTHER): Payer: PPO

## 2023-08-05 DIAGNOSIS — I495 Sick sinus syndrome: Secondary | ICD-10-CM

## 2023-08-07 LAB — CUP PACEART REMOTE DEVICE CHECK
Battery Impedance: 1781 Ohm
Battery Remaining Longevity: 46 mo
Battery Voltage: 2.77 V
Brady Statistic AP VP Percent: 5 %
Brady Statistic AP VS Percent: 93 %
Brady Statistic AS VP Percent: 1 %
Brady Statistic AS VS Percent: 1 %
Date Time Interrogation Session: 20250123132430
Implantable Lead Connection Status: 753985
Implantable Lead Connection Status: 753985
Implantable Lead Implant Date: 20150403
Implantable Lead Implant Date: 20150403
Implantable Lead Location: 753859
Implantable Lead Location: 753860
Implantable Lead Model: 5076
Implantable Lead Model: 5076
Implantable Pulse Generator Implant Date: 20150403
Lead Channel Impedance Value: 506 Ohm
Lead Channel Impedance Value: 605 Ohm
Lead Channel Pacing Threshold Amplitude: 0.625 V
Lead Channel Pacing Threshold Amplitude: 0.75 V
Lead Channel Pacing Threshold Pulse Width: 0.4 ms
Lead Channel Pacing Threshold Pulse Width: 0.4 ms
Lead Channel Setting Pacing Amplitude: 1.5 V
Lead Channel Setting Pacing Amplitude: 2 V
Lead Channel Setting Pacing Pulse Width: 0.4 ms
Lead Channel Setting Sensing Sensitivity: 2.8 mV
Zone Setting Status: 755011
Zone Setting Status: 755011

## 2023-08-12 DIAGNOSIS — L570 Actinic keratosis: Secondary | ICD-10-CM | POA: Diagnosis not present

## 2023-08-12 DIAGNOSIS — L814 Other melanin hyperpigmentation: Secondary | ICD-10-CM | POA: Diagnosis not present

## 2023-08-12 DIAGNOSIS — L720 Epidermal cyst: Secondary | ICD-10-CM | POA: Diagnosis not present

## 2023-08-12 DIAGNOSIS — L821 Other seborrheic keratosis: Secondary | ICD-10-CM | POA: Diagnosis not present

## 2023-08-12 DIAGNOSIS — Z85828 Personal history of other malignant neoplasm of skin: Secondary | ICD-10-CM | POA: Diagnosis not present

## 2023-08-18 DIAGNOSIS — Z7984 Long term (current) use of oral hypoglycemic drugs: Secondary | ICD-10-CM | POA: Diagnosis not present

## 2023-08-18 DIAGNOSIS — Z961 Presence of intraocular lens: Secondary | ICD-10-CM | POA: Diagnosis not present

## 2023-08-18 DIAGNOSIS — E119 Type 2 diabetes mellitus without complications: Secondary | ICD-10-CM | POA: Diagnosis not present

## 2023-09-02 ENCOUNTER — Ambulatory Visit: Payer: PPO | Admitting: Podiatry

## 2023-09-03 ENCOUNTER — Other Ambulatory Visit: Payer: Self-pay | Admitting: Cardiology

## 2023-09-09 ENCOUNTER — Ambulatory Visit (INDEPENDENT_AMBULATORY_CARE_PROVIDER_SITE_OTHER): Payer: PPO | Admitting: Podiatry

## 2023-09-09 ENCOUNTER — Encounter: Payer: Self-pay | Admitting: Podiatry

## 2023-09-09 DIAGNOSIS — M79675 Pain in left toe(s): Secondary | ICD-10-CM

## 2023-09-09 DIAGNOSIS — M79674 Pain in right toe(s): Secondary | ICD-10-CM | POA: Diagnosis not present

## 2023-09-09 DIAGNOSIS — E119 Type 2 diabetes mellitus without complications: Secondary | ICD-10-CM

## 2023-09-09 DIAGNOSIS — B351 Tinea unguium: Secondary | ICD-10-CM | POA: Diagnosis not present

## 2023-09-09 NOTE — Progress Notes (Signed)
 This patient returns to my office for at risk foot care.  This patient requires this care by a professional since this patient will be at risk due to having diabetes.  This patient is unable to cut nails himself since the patient cannot reach his nails.These nails are painful walking and wearing shoes.  This patient presents for at risk foot care today.  General Appearance  Alert, conversant and in no acute stress.  Vascular  Dorsalis pedis and posterior tibial  pulses are weakly  palpable  bilaterally.  Capillary return is within normal limits  bilaterally. Temperature is within normal limits  bilaterally.  Neurologic  Senn-Weinstein monofilament wire test within normal limits  bilaterally. Muscle power within normal limits bilaterally.  Nails Thick disfigured discolored nails with subungual debris  from hallux to fifth toes bilaterally. No evidence of bacterial infection or drainage bilaterally.  Orthopedic  No limitations of motion  feet .  No crepitus or effusions noted.  No bony pathology or digital deformities noted.  Skin  normotropic skin with no porokeratosis noted bilaterally.  No signs of infections or ulcers noted.     Onychomycosis  Pain in right toes  Pain in left toes  Consent was obtained for treatment procedures.   Mechanical debridement of nails 1-5  bilaterally performed with a nail nipper.  Filed with dremel without incident.    Return office visit     3 months                 Told patient to return for periodic foot care and evaluation due to potential at risk complications.   Helane Gunther DPM

## 2023-09-18 NOTE — Progress Notes (Signed)
 Remote pacemaker transmission.

## 2023-09-18 NOTE — Addendum Note (Signed)
 Addended by: Elease Etienne A on: 09/18/2023 09:12 AM   Modules accepted: Orders

## 2023-11-04 ENCOUNTER — Ambulatory Visit (INDEPENDENT_AMBULATORY_CARE_PROVIDER_SITE_OTHER): Payer: PPO

## 2023-11-04 DIAGNOSIS — I495 Sick sinus syndrome: Secondary | ICD-10-CM

## 2023-11-05 LAB — CUP PACEART REMOTE DEVICE CHECK
Battery Impedance: 1890 Ohm
Battery Remaining Longevity: 43 mo
Battery Voltage: 2.76 V
Brady Statistic AP VP Percent: 4 %
Brady Statistic AP VS Percent: 94 %
Brady Statistic AS VP Percent: 0 %
Brady Statistic AS VS Percent: 1 %
Date Time Interrogation Session: 20250423125645
Implantable Lead Connection Status: 753985
Implantable Lead Connection Status: 753985
Implantable Lead Implant Date: 20150403
Implantable Lead Implant Date: 20150403
Implantable Lead Location: 753859
Implantable Lead Location: 753860
Implantable Lead Model: 5076
Implantable Lead Model: 5076
Implantable Pulse Generator Implant Date: 20150403
Lead Channel Impedance Value: 496 Ohm
Lead Channel Impedance Value: 620 Ohm
Lead Channel Pacing Threshold Amplitude: 0.5 V
Lead Channel Pacing Threshold Amplitude: 0.625 V
Lead Channel Pacing Threshold Pulse Width: 0.4 ms
Lead Channel Pacing Threshold Pulse Width: 0.4 ms
Lead Channel Setting Pacing Amplitude: 1.5 V
Lead Channel Setting Pacing Amplitude: 2 V
Lead Channel Setting Pacing Pulse Width: 0.4 ms
Lead Channel Setting Sensing Sensitivity: 2 mV
Zone Setting Status: 755011
Zone Setting Status: 755011

## 2023-11-27 DIAGNOSIS — I2510 Atherosclerotic heart disease of native coronary artery without angina pectoris: Secondary | ICD-10-CM | POA: Diagnosis not present

## 2023-11-27 DIAGNOSIS — E782 Mixed hyperlipidemia: Secondary | ICD-10-CM | POA: Diagnosis not present

## 2023-11-27 DIAGNOSIS — N182 Chronic kidney disease, stage 2 (mild): Secondary | ICD-10-CM | POA: Diagnosis not present

## 2023-11-27 DIAGNOSIS — I483 Typical atrial flutter: Secondary | ICD-10-CM | POA: Diagnosis not present

## 2023-11-27 DIAGNOSIS — E1122 Type 2 diabetes mellitus with diabetic chronic kidney disease: Secondary | ICD-10-CM | POA: Diagnosis not present

## 2023-11-29 ENCOUNTER — Other Ambulatory Visit: Payer: Self-pay | Admitting: Cardiology

## 2023-12-04 DIAGNOSIS — E782 Mixed hyperlipidemia: Secondary | ICD-10-CM | POA: Diagnosis not present

## 2023-12-04 DIAGNOSIS — N182 Chronic kidney disease, stage 2 (mild): Secondary | ICD-10-CM | POA: Diagnosis not present

## 2023-12-04 DIAGNOSIS — E113293 Type 2 diabetes mellitus with mild nonproliferative diabetic retinopathy without macular edema, bilateral: Secondary | ICD-10-CM | POA: Diagnosis not present

## 2023-12-04 DIAGNOSIS — I2510 Atherosclerotic heart disease of native coronary artery without angina pectoris: Secondary | ICD-10-CM | POA: Diagnosis not present

## 2023-12-04 DIAGNOSIS — I10 Essential (primary) hypertension: Secondary | ICD-10-CM | POA: Diagnosis not present

## 2023-12-04 DIAGNOSIS — E1122 Type 2 diabetes mellitus with diabetic chronic kidney disease: Secondary | ICD-10-CM | POA: Diagnosis not present

## 2023-12-04 DIAGNOSIS — I483 Typical atrial flutter: Secondary | ICD-10-CM | POA: Diagnosis not present

## 2023-12-09 ENCOUNTER — Ambulatory Visit: Payer: PPO | Admitting: Podiatry

## 2023-12-09 ENCOUNTER — Encounter: Payer: Self-pay | Admitting: Podiatry

## 2023-12-09 DIAGNOSIS — B351 Tinea unguium: Secondary | ICD-10-CM | POA: Diagnosis not present

## 2023-12-09 DIAGNOSIS — M79674 Pain in right toe(s): Secondary | ICD-10-CM | POA: Diagnosis not present

## 2023-12-09 DIAGNOSIS — E119 Type 2 diabetes mellitus without complications: Secondary | ICD-10-CM | POA: Diagnosis not present

## 2023-12-09 DIAGNOSIS — M79675 Pain in left toe(s): Secondary | ICD-10-CM

## 2023-12-09 NOTE — Progress Notes (Signed)
 This patient returns to my office for at risk foot care.  This patient requires this care by a professional since this patient will be at risk due to having diabetes.  This patient is unable to cut nails himself since the patient cannot reach his nails.These nails are painful walking and wearing shoes.  This patient presents for at risk foot care today.  General Appearance  Alert, conversant and in no acute stress.  Vascular  Dorsalis pedis and posterior tibial  pulses are weakly  palpable  bilaterally.  Capillary return is within normal limits  bilaterally. Temperature is within normal limits  bilaterally.  Neurologic  Senn-Weinstein monofilament wire test within normal limits  bilaterally. Muscle power within normal limits bilaterally.  Nails Thick disfigured discolored nails with subungual debris  from hallux to fifth toes bilaterally. No evidence of bacterial infection or drainage bilaterally.  Orthopedic  No limitations of motion  feet .  No crepitus or effusions noted.  No bony pathology or digital deformities noted.  Skin  normotropic skin with no porokeratosis noted bilaterally.  No signs of infections or ulcers noted.     Onychomycosis  Pain in right toes  Pain in left toes  Consent was obtained for treatment procedures.   Mechanical debridement of nails 1-5  bilaterally performed with a nail nipper.  Filed with dremel without incident.    Return office visit     3 months                 Told patient to return for periodic foot care and evaluation due to potential at risk complications.   Helane Gunther DPM

## 2023-12-17 NOTE — Addendum Note (Signed)
 Addended by: Lott Rouleau A on: 12/17/2023 10:54 AM   Modules accepted: Orders

## 2023-12-17 NOTE — Progress Notes (Signed)
 Remote pacemaker transmission.

## 2023-12-25 ENCOUNTER — Other Ambulatory Visit: Payer: Self-pay | Admitting: Cardiology

## 2024-01-04 ENCOUNTER — Other Ambulatory Visit: Payer: Self-pay | Admitting: Cardiology

## 2024-01-12 DIAGNOSIS — M25561 Pain in right knee: Secondary | ICD-10-CM | POA: Diagnosis not present

## 2024-01-19 ENCOUNTER — Other Ambulatory Visit: Payer: Self-pay | Admitting: Cardiology

## 2024-01-21 NOTE — Telephone Encounter (Signed)
 Pt has had 3rd and final attempt for rf and still has not scheduled appt. Please review

## 2024-01-27 NOTE — Telephone Encounter (Signed)
 We still need to fill the beta-blocker.  Cannot stop beta-blockers.  He just needs to get into see an APP.

## 2024-02-01 NOTE — Telephone Encounter (Signed)
 RN spoke to patient.  RN informed patient he will need a an appointment for medication refill.  Patient states he has tried  x3 for an appointment each time the appointment was available   RN schedule an appointment on 03/07/24 with Dr Anner. And 03/17/24 with Dr Francyne  for  annual appt for pacer check.  Patient voiced understanding.  He states they had been seeing him in 6 month intervals. RN informed patient will attempt to get him back on schedule.

## 2024-02-03 ENCOUNTER — Ambulatory Visit

## 2024-02-03 DIAGNOSIS — I495 Sick sinus syndrome: Secondary | ICD-10-CM

## 2024-02-08 LAB — CUP PACEART REMOTE DEVICE CHECK
Battery Impedance: 1834 Ohm
Battery Remaining Longevity: 44 mo
Battery Voltage: 2.76 V
Brady Statistic AP VP Percent: 4 %
Brady Statistic AP VS Percent: 95 %
Brady Statistic AS VP Percent: 0 %
Brady Statistic AS VS Percent: 1 %
Date Time Interrogation Session: 20250725143955
Implantable Lead Connection Status: 753985
Implantable Lead Connection Status: 753985
Implantable Lead Implant Date: 20150403
Implantable Lead Implant Date: 20150403
Implantable Lead Location: 753859
Implantable Lead Location: 753860
Implantable Lead Model: 5076
Implantable Lead Model: 5076
Implantable Pulse Generator Implant Date: 20150403
Lead Channel Impedance Value: 478 Ohm
Lead Channel Impedance Value: 601 Ohm
Lead Channel Pacing Threshold Amplitude: 0.5 V
Lead Channel Pacing Threshold Amplitude: 0.625 V
Lead Channel Pacing Threshold Pulse Width: 0.4 ms
Lead Channel Pacing Threshold Pulse Width: 0.4 ms
Lead Channel Setting Pacing Amplitude: 1.5 V
Lead Channel Setting Pacing Amplitude: 2 V
Lead Channel Setting Pacing Pulse Width: 0.4 ms
Lead Channel Setting Sensing Sensitivity: 2 mV
Zone Setting Status: 755011
Zone Setting Status: 755011

## 2024-02-15 ENCOUNTER — Ambulatory Visit: Payer: Self-pay | Admitting: Cardiovascular Disease

## 2024-03-07 ENCOUNTER — Ambulatory Visit: Attending: Cardiology | Admitting: Cardiology

## 2024-03-07 ENCOUNTER — Encounter: Payer: Self-pay | Admitting: Cardiology

## 2024-03-07 VITALS — BP 178/78 | HR 68 | Ht 73.0 in | Wt 195.4 lb

## 2024-03-07 DIAGNOSIS — I495 Sick sinus syndrome: Secondary | ICD-10-CM

## 2024-03-07 DIAGNOSIS — E785 Hyperlipidemia, unspecified: Secondary | ICD-10-CM

## 2024-03-07 DIAGNOSIS — I25718 Atherosclerosis of autologous vein coronary artery bypass graft(s) with other forms of angina pectoris: Secondary | ICD-10-CM

## 2024-03-07 DIAGNOSIS — I10 Essential (primary) hypertension: Secondary | ICD-10-CM | POA: Diagnosis not present

## 2024-03-07 DIAGNOSIS — I2510 Atherosclerotic heart disease of native coronary artery without angina pectoris: Secondary | ICD-10-CM

## 2024-03-07 DIAGNOSIS — E1169 Type 2 diabetes mellitus with other specified complication: Secondary | ICD-10-CM | POA: Diagnosis not present

## 2024-03-07 DIAGNOSIS — Z950 Presence of cardiac pacemaker: Secondary | ICD-10-CM | POA: Diagnosis not present

## 2024-03-07 DIAGNOSIS — I4729 Other ventricular tachycardia: Secondary | ICD-10-CM

## 2024-03-07 DIAGNOSIS — R5382 Chronic fatigue, unspecified: Secondary | ICD-10-CM

## 2024-03-07 NOTE — Progress Notes (Unsigned)
 Cardiology Office Note:  .   Date:  03/08/2024  ID:  TREXTON ESCAMILLA, DOB 1950/09/25, MRN [id number] PCP/Referring Physician: Verdia Lombard, MD  [facility] Providers Cardiologist:  Alm Clay, MD Cardiology APP:  Emelia Josefa HERO, NP     Chief Complaint  Patient presents with   Follow-up   Coronary Artery Disease    Patient Profile: .     Erik Lane is a pleasant 73 y.o. male  with a PMH notable for CAD, HTN, HLD, DM-2 and s/p PPM for SSS who presents here for 69-month follow-up.    CAD in native artery 2005    multivessel CAD, ostial 50-60% LAD, mid LAD 75-80% after D1; OM 2 with 50-60% followed by 60% after OM1. RCA small vessel. --> CABG LIMA-LAD, SVG-OM1, seq SVG-OM 2-OM-3 ; 08/2005 SVG-OM1 TO; MYOVIEW  September 2021: Low Normal EF 49 %. Small mostly fixed basal-mid anterolateral defect - c/w prior infarct with peri-infarct ischemia.  No change compared to 2016, 2018 & 2019.  HTN, HLD, DM-2  Symptomatic sinus bradycardia 1996    status post pacemaker placement, exchanged in 2003 (Medtronic Sigma, in DDDR) - Gen Change 2015 => Medtronic Adapta dual-chamber PPM   I have not seen [name].  He was doing better with his low-dose beta-blocker along with moderate dose of losartan  and rosuvastatin .  No breakthrough spells of A-fib.  Was as usual due for labs rechecked by PCP but previous labs that showed LDL of 65.  He was able to dance and do yard work without difficulty or shortness of breath and no chest pressure.  But would note exertional dyspnea walking up stairs or uphill.  No angina.  Mild swelling and arthritis pains.  No medication changes made.     [name] was last seen on Dec 01, 2022 by Dr. Jerel Croitoru for follow-up evaluation of his routine pacemaker check.  He was doing well.  Noted some orthostatic dizziness--worse after his pneumonia vaccine.  No syncope or falls.  No sensation of palpitations.  No angina or dyspnea at rest or  exertion no edema.  Had been started on Jardiance.  PPM interrogation showed normal function.  Remaining generator longevity estimated 4.5 years.  97% a pacing but only 1 to 2% V pacing.  No fast heart rate spells.  Rare atrial mode switching lasting less than 30 seconds.  Normal histograms.  No evidence of NSVT.  No angina with most recent Myoview  was nonischemic.  He was having some orthostatic hypotension issues potentially related to hypovolemia from [place].  Labs followed by Dr. Clarice.  Encourage adequate hydration and continued use of Jardiance.  Subjective  Discussed the use of AI scribe software for clinical note transcription with the patient, who gave verbal consent to proceed.  History of Present Illness Erik Lane is a 73 year old male with hypertension and coronary artery disease who presents for follow-up regarding his blood pressure management and overall cardiac health.  He has not been seen in over a year due to difficulty in scheduling appointments. At home, his blood pressure typically ranges from 118 to 120 systolic. He does not regularly check his blood pressure despite having a new machine.  He has a history of a pacemaker placement in 2015 and last saw the cardiologist who performed the procedure about six months ago. No recent chest pain, pressure, or tightness. No heart racing, skipping, or stroke-like symptoms. He experiences fatigue and low energy, which he attributes to  retirement, and occasionally feels dizzy upon waking, which he associates with his medication.  He has diabetes and is currently on Jardiance 25 mg, Amaryl 3 mg in the morning, and Metformin  1000 mg twice a day. His recent hemoglobin A1c was 7.2, down from 7.5.  He takes Rosuvastatin  40 mg for cholesterol and Losartan  50 mg for blood pressure, along with a daily baby aspirin . No swelling in his legs, shortness of breath, or angina symptoms. He occasionally experiences headaches but not in relation to  his blood pressure readings.  He had a stress test in 2019 and 2021, which showed a pump function of about 50 % that were read as intermediate risk due to evidence of a prior heart attack and a right bundle branch block. He has not had a heart catheterization since 2007. No new symptoms suggestive of a change in his cardiac status.     Objective   Medications: Aspirin  81 mg daily,;Crestor  40 mm daily; losartan  50 mg daily; Lopressor  12.5 mg twice daily; Jardiance 25 mg daily; metformin  500 mg tablets 2 tabs twice daily; glimepiride 2 mg daily (supposed to be taking 1.5 tabs-3 mg d daily) -however he is only taking 1 tabs twice daily; as needed NTG-not using.  Studies Reviewed: SABRA   EKG Interpretation Date/Time:  Monday March 07 2024 13:42:19 EDT Ventricular Rate:  68 PR Interval:  204 QRS Duration:  152 QT Interval:  432 QTC Calculation: 459 R Axis:   69  Text Interpretation: Atrial-paced rhythm Right bundle branch block When compared with ECG of 15-Oct-2013 04:13, T wave inversion no longer evident in Inferior leads Confirmed by Anner Lenis ([id number]) on 03/07/2024 2:00:18 PM    Results LABS (12/06/2023): NA 141, K+ 4.2.  BUN/Cr 14/1.02, GLU 97.  A1c 7.2.  TC 122, TG 108, HDL 40, LDL 62; TSH 4.49  No recent studies.  Last Myoview  was in 2021.  Stable from 2016, 2018 in 2019.  Risk Assessment/Calculations:     HYPERTENSION CONTROL Vitals:   03/07/24 1338   BP: (!) 178/78 (!) 148/72    The patient's blood pressure is elevated above target today.  In order to address the patient's elevated BP: Blood pressure will be monitored at home to determine if medication changes need to be made. (Usually blood pressures are better.  Follow at home and reassess in close follow-up)           Physical Exam:   VS:  BP (!) 148/72   Pulse 68   Ht 6' 1 (1.854 m)   Wt 195 lb 6.4 oz (88.6 kg)   SpO2 96%   BMI 25.78 kg/m    Wt Readings from Last 3 Encounters:  03/07/24 195 lb 6.4 oz  (88.6 kg)  12/01/22 195 lb 12.8 oz (88.8 kg)  04/15/22 202 lb (91.6 kg)      GEN: Well nourished, well groomed in no acute distress; relatively healthy appearing.  Mild truncal obesity NECK: No JVD; No carotid bruits CARDIAC: Normal S1, split S2; RRR, no murmurs, rubs, gallops RESPIRATORY:  Clear to auscultation without rales, wheezing or rhonchi ; nonlabored, good air movement. ABDOMEN: Soft, non-tender, non-distended EXTREMITIES:  No edema; No deformity; very difficult measuring blood pressure on the right arm.     ASSESSMENT AND PLAN: .    Problem List Items Addressed This Visit       Cardiology Problems   CAD (coronary artery disease), autologous vein bypass graft (Chronic)   Known occlusion of SVG-OM1 but other  grafts have remained patent as best we can tell.  This potentially explains the recurrent stress test results.      Coronary artery disease involving native coronary artery of native heart without angina pectoris (Chronic)   No active angina symptoms.  Simply notes exertional fatigue. Most recent Myoview  was nonischemic in 2021.  Showed infarct with peri-infarct ischemia consistent with occluded graft to the OM.  This was no change from 2016, 2018 and 2019.  - Continue aspirin  81 mg daily along with rosuvastatin  40 g daily, Lopressor  12.5 mg twice daily and losartan  50 mg daily.  Low threshold to titrate losartan  further for additional blood pressure control -Diabetes management per PCP      Essential hypertension - Primary (Chronic)   Blood pressure elevated today, typically well-controlled at home. No symptoms.  Difficulty with right arm reading due to equipment issues. - For now continue current doses of losartan  50 mg daily Lopressor  12.5 mg twice daily - Monitor blood pressure at home, especially week before appointment with Doctor Croitoru. - Consider increasing losartan  dose to 100 mg if home readings remain elevated.  - Monitor for dizziness and adjust  medications if necessary.  Avoid diuretic if possible.      Relevant Orders   EKG 12-Lead (Completed)   Hyperlipidemia associated with type 2 diabetes mellitus (HCC) (Chronic)   Hyperlipidemia Cholesterol levels well-controlled. Total cholesterol 122, triglycerides 108, HDL 40, LDL 62. - Continue current medication regimen. . Hemoglobin A1c improved to 7.2, above target of <6.5. Reports morning dizziness, possibly medication-related. - Continue Amaryl, metformin , and Jardiance.      NSVT (nonsustained ventricular tachycardia) (HCC) (Chronic)   No recurrent episodes noted.  On stable low-dose beta-blocker.  Continue to monitor pacemaker.      SSS (sick sinus syndrome) (HCC) (Chronic)   Status post PPM.  Last GEN change was in 2015 and half years left of battery life.  Followed by Dr. Jerel Croitoru        Other   Chronic fatigue   Longstanding complaint.  Dates back to when he was still working full-time.  No real change.  We have adjusted multiple different medications to see if that medication related did not necessarily notice any improvement.  Continue to monitor.      Status post cardiac pacemaker procedure (Chronic)   Pacemaker GEN change 2015 for bradycardia               Follow-Up: Return in about 6 months (around 09/07/2024) for Alternate 6 month follow-up with APP & MD.  I spent 52 minutes in the care of Lamar LITTIE Sable today including reviewing outside labs from [facility] (1 minute,), reviewing studies (previous Myoview  and echo reviewed along with cardiac Reports-5 minutes), face to face time discussing treatment options (26 minutes), reviewing records from my previous note as well as Dr. Eleno note (5 minutes), 15 minutes dictating, and documenting in the encounter.  It has been almost 2 years since I have seen him and required chart review.     Signed, Alm MICAEL Clay, MD, MS Alm Clay, M.D., M.S. Interventional Chartered certified accountant   Pager # [phone]

## 2024-03-07 NOTE — Patient Instructions (Signed)
 Medication Instructions:   No changes     Lab Work: Not needed    Testing/Procedures:  No needed  Follow-Up: At [facility], you and your health needs are our priority.  As part of our continuing mission to provide you with exceptional heart care, we have created designated Provider Care Teams.  These Care Teams include your primary Cardiologist (physician) and Advanced Practice Providers (APPs -  Physician Assistants and Nurse Practitioners) who all work together to provide you with the care you need, when you need it.     Your next appointment:   6 month(s)  The format for your next appointment:   In Person  Provider:   Josefa Beauvais, NP      Then, Alm Clay, MD will plan to see you again in 12 month(s).   Keep your appointment with Dr Francyne   - and bring your blood pressure reading

## 2024-03-08 ENCOUNTER — Encounter: Payer: Self-pay | Admitting: Cardiology

## 2024-03-08 DIAGNOSIS — R5382 Chronic fatigue, unspecified: Secondary | ICD-10-CM | POA: Insufficient documentation

## 2024-03-08 NOTE — Assessment & Plan Note (Signed)
 Hyperlipidemia Cholesterol levels well-controlled. Total cholesterol 122, triglycerides 108, HDL 40, LDL 62. - Continue current medication regimen. . Hemoglobin A1c improved to 7.2, above target of <6.5. Reports morning dizziness, possibly medication-related. - Continue Amaryl, metformin , and Jardiance.

## 2024-03-08 NOTE — Assessment & Plan Note (Signed)
 Pacemaker GEN change 2015 for bradycardia

## 2024-03-08 NOTE — Assessment & Plan Note (Signed)
 No recurrent episodes noted.  On stable low-dose beta-blocker.  Continue to monitor pacemaker.

## 2024-03-08 NOTE — Assessment & Plan Note (Addendum)
 Blood pressure elevated today, typically well-controlled at home. No symptoms.  Difficulty with right arm reading due to equipment issues. - For now continue current doses of losartan  50 mg daily Lopressor  12.5 mg twice daily - Monitor blood pressure at home, especially week before appointment with Doctor Croitoru. - Consider increasing losartan  dose to 100 mg if home readings remain elevated.  - Monitor for dizziness and adjust medications if necessary.  Avoid diuretic if possible.

## 2024-03-08 NOTE — Assessment & Plan Note (Signed)
 No active angina symptoms.  Simply notes exertional fatigue. Most recent Myoview  was nonischemic in 2021.  Showed infarct with peri-infarct ischemia consistent with occluded graft to the OM.  This was no change from 2016, 2018 and 2019.  - Continue aspirin  81 mg daily along with rosuvastatin  40 g daily, Lopressor  12.5 mg twice daily and losartan  50 mg daily.  Low threshold to titrate losartan  further for additional blood pressure control -Diabetes management per PCP

## 2024-03-08 NOTE — Assessment & Plan Note (Addendum)
 Status post PPM.  Last GEN change was in 2015 and half years left of battery life.  Followed by Dr. Jerel Balding

## 2024-03-08 NOTE — Assessment & Plan Note (Addendum)
 Known occlusion of SVG-OM1 but other grafts have remained patent as best we can tell.  This potentially explains the recurrent stress test results.

## 2024-03-08 NOTE — Assessment & Plan Note (Signed)
 Longstanding complaint.  Dates back to when he was still working full-time.  No real change.  We have adjusted multiple different medications to see if that medication related did not necessarily notice any improvement.  Continue to monitor.

## 2024-03-09 ENCOUNTER — Encounter: Payer: Self-pay | Admitting: Podiatry

## 2024-03-09 ENCOUNTER — Ambulatory Visit: Admitting: Podiatry

## 2024-03-09 DIAGNOSIS — E119 Type 2 diabetes mellitus without complications: Secondary | ICD-10-CM

## 2024-03-09 DIAGNOSIS — M79675 Pain in left toe(s): Secondary | ICD-10-CM | POA: Diagnosis not present

## 2024-03-09 DIAGNOSIS — B351 Tinea unguium: Secondary | ICD-10-CM

## 2024-03-09 DIAGNOSIS — M79674 Pain in right toe(s): Secondary | ICD-10-CM

## 2024-03-09 NOTE — Progress Notes (Signed)
 This patient returns to my office for at risk foot care.  This patient requires this care by a professional since this patient will be at risk due to having diabetes.  This patient is unable to cut nails himself since the patient cannot reach his nails.These nails are painful walking and wearing shoes.  This patient presents for at risk foot care today.  General Appearance  Alert, conversant and in no acute stress.  Vascular  Dorsalis pedis and posterior tibial  pulses are weakly  palpable  bilaterally.  Capillary return is within normal limits  bilaterally. Temperature is within normal limits  bilaterally.  Neurologic  Senn-Weinstein monofilament wire test within normal limits  bilaterally. Muscle power within normal limits bilaterally.  Nails Thick disfigured discolored nails with subungual debris  from hallux to fifth toes bilaterally. No evidence of bacterial infection or drainage bilaterally.  Orthopedic  No limitations of motion  feet .  No crepitus or effusions noted.  No bony pathology or digital deformities noted.  Skin  normotropic skin with no porokeratosis noted bilaterally.  No signs of infections or ulcers noted.     Onychomycosis  Pain in right toes  Pain in left toes  Consent was obtained for treatment procedures.   Mechanical debridement of nails 1-5  bilaterally performed with a nail nipper.  Filed with dremel without incident.    Return office visit     3 months                 Told patient to return for periodic foot care and evaluation due to potential at risk complications.   Helane Gunther DPM

## 2024-03-17 ENCOUNTER — Encounter: Payer: Self-pay | Admitting: Cardiovascular Disease

## 2024-03-17 ENCOUNTER — Ambulatory Visit: Attending: Cardiovascular Disease | Admitting: Cardiovascular Disease

## 2024-03-17 VITALS — BP 152/76 | HR 65 | Ht 73.0 in | Wt 197.1 lb

## 2024-03-17 DIAGNOSIS — I2510 Atherosclerotic heart disease of native coronary artery without angina pectoris: Secondary | ICD-10-CM | POA: Diagnosis not present

## 2024-03-17 DIAGNOSIS — I495 Sick sinus syndrome: Secondary | ICD-10-CM | POA: Diagnosis not present

## 2024-03-17 DIAGNOSIS — E785 Hyperlipidemia, unspecified: Secondary | ICD-10-CM | POA: Diagnosis not present

## 2024-03-17 DIAGNOSIS — I10 Essential (primary) hypertension: Secondary | ICD-10-CM | POA: Diagnosis not present

## 2024-03-17 DIAGNOSIS — I4729 Other ventricular tachycardia: Secondary | ICD-10-CM

## 2024-03-17 DIAGNOSIS — Z950 Presence of cardiac pacemaker: Secondary | ICD-10-CM

## 2024-03-17 DIAGNOSIS — E119 Type 2 diabetes mellitus without complications: Secondary | ICD-10-CM | POA: Diagnosis not present

## 2024-03-17 NOTE — Progress Notes (Signed)
 Cardiology Office Note    Date:  03/17/2024   ID:  Erik Lane, DOB 01/29/1951, MRN [id number]  PCP:  Verdia Lombard, MD  Cardiologist:  Alm Clay, M.D.; Jerel Balding, MD   No chief complaint on file.   History of Present Illness:  Erik Lane is a 73 y.o. male with an extensive history of coronary artery disease and previous bypass surgery, here for pacemaker check. His device is a dual-chamber Medtronic Adapta implanted April 2015 for sinus node dysfunction.  In addition to the current active right subclavian pacemaker he has an abandoned old system with unipolar leads in the left subclavian area.  He is feeling quite well.  He continues to do his own yard work although he has to take some breaks when he uses the [employer].  He denies chest pain.  He has not had any dizziness or syncope.  He denies edema, orthopnea, PND.  Not aware of any palpitations.  When he checks his blood pressure with his home automatic Omron cuff the readings are in the 108/59-129/81 range, but when he is seen in the office his blood pressure has been consistently elevated.  It was 170/78 on August 25 and is 152/76 when rechecked a second time today.  Metabolic control is fair with a hemoglobin A1c of 7.2% on Jardiance and metformin , LDL cholesterol 62, HDL 48.  He inadvertently discovered that he was not taking the prescribed dose of glipizide at his office appointment a week ago and this may explain why his glucose levels have increased slightly.  He has normal renal function and thyroid  function parameters.   Pacemaker interrogation shows normal device function.  Presenting rhythm is atrial paced, ventricular sensed and his device shows that he is in this rhythm better than 95% of the time.  He almost never requires ventricular pacing.  Estimated generator longevity is approximately 3-1/2 years.  Both the atrial and ventricular leads are Medtronic 5076 leads so when he will have his next  generator change out he will have a MRI conditional system, but the current Medtronic Adapta generator is not MRI compatible.  He has had very few and very brief episodes of mode switch due to atrial tachycardia.  He has not had any episodes of high ventricular rate.   He originally received a pacemaker in 1996 (unipolar leads) sinus node dysfunction with intermittent AV block and had a generator change in 2003.  Unipolar lead noise began interfering with device function with numerous episodes of mode switch due to artifact.  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  The old pacemaker was deactivated but left in place and he received an entirely new system in the right subclavian area in 2015 with new bipolar leads.  Ever since the new system was implanted we have not recorded any atrial flutter or atrial fibrillation as reported on his old unipolar leads.   Past Medical History:  Diagnosis Date   Abnormal nuclear stress test March 2007; March 2016   for DOT physical/CDL license: Inferolateral ischemia - Considered False-Positive, but cath showed 100% SVG-OM occlusion; b.medium size, moderate intensity partially reversible anterolateral defect c/w prior MI & per-infarct ischemia, normal wall motion& EF - considered c/w anatomy.   CAD (coronary artery disease), autologous vein bypass graft February 2007   Occluded SVG-OM1 with patent LIMA-LAD & seq SVG-OM2-3   CAD in native artery 2005   multivessel CAD, ostial 50-60% LAD, mid LAD 75-80% after D1; OM  2 with 50-60% followed by 60% after OM1. RCA small vessel. --> CABG referral   COVID-19 virus infection 06/2019   Had some respiratory symptoms, but mild case.   Dyslipidemia, goal LDL below 70    on statin   Essential hypertension    Hernia of abdominal cavity    Mild mitral regurgitation by prior echocardiogram 04/2013   Echo: EF 55-60%, G1 DD, mild MR   Paroxysmal atrial fibrillation (HCC)    no recent  documentation, although frequent mode switches noted but less than 2 minutes interval.   S/P CABG x 4 2005   LIMA-LAD, SVG-OM1, seq SVG-OM 2-OM-3   Status post cardiac pacemaker procedure 1996, 2003; 10-2013   Medtronic Adapta - replaced 10/2013   Symptomatic sinus bradycardia 1996   status post pacemaker placement, exchanged in 2003 (Medtronic Sigma, in DDDR)    Past Surgical History:  Procedure Laterality Date   APPENDECTOMY     CARDIAC CATHETERIZATION  01/17/2004   multivessel disease with osital 50-60% LAD, mid 70-80% after first diagonal, Cfx and OM2 had 50-60% lesion, 60% lesion after OM1 (Dr. RONAL Ly)   CARDIAC CATHETERIZATION  09/16/2005   SVG to OM1 occluded, other grafts patent, patent RCA, normal LV systolic function (Dr. DELENA. Little)   CORONARY ARTERY BYPASS GRAFT  01/24/2004   LIMA-LAD, SVG-OM1, SVG-OM2/OM3 - Dr. Fleeta Ochoa   GENERATOR REMOVAL  10/14/2013   Procedure: GENERATOR REMOVAL;  Surgeon: Jerel Balding, MD;  Location: [facility]ATH [facility];  Service: Cardiovascular;;   HERNIA REPAIR     NM MYOCAR PERF WALL MOTION  08/2005   mod inferolateral ischemia seen at apex and mid ventricular level, EF 62%   NM MYOCAR PERF WALL MOTION  August 2017, December 2019   a) LOW RISK. EF 53%. Post CABG septal wall hypokinesis. There is a small sized mild severity defect in the mid anterolateral wall with no ischemia. Likely related to prior infarct (occluded SVG-OM1). Consistent with prior study.;; b) EF 50 to 54%.  Small area of inferolateral ischemia and basal level.  Report as ischemia -more c/w infarction.  Read as intermediate but interpreted as low RIK   PACEMAKER INSERTION  1996 ; 2003; 10-14-13   symptomatic bradycardia. generator change 2003 (Medtronic Sigma) DDDR; left sided device explanted 10-14-13 with new pacing system implatned on right side - MDT ADDRL1 by Dr Balding   TRANSTHORACIC ECHOCARDIOGRAM  1996; 04/2013   normal LV function, mild LA dilatation;; b) 10/'14 - EF 55-60%, Gr 1 DD, mild  LA dilation, Mild MR    Current Medications: Outpatient Medications Prior to Visit  Medication Sig Dispense Refill   aspirin  EC 81 MG tablet Take 1 tablet (81 mg total) by mouth daily. 90 tablet 3   glimepiride (AMARYL) 2 MG tablet Take 2 mg by mouth every morning. Pt takes 1.5 tablets in morning     JARDIANCE 25 MG TABS tablet Take 25 mg by mouth daily.     Lancets (ONETOUCH DELICA PLUS LANCET33G) MISC TEST EVERY DAY AS DIRECTED     losartan  (COZAAR ) 50 MG tablet TAKE 1 TABLET BY MOUTH EVERY DAY 90 tablet 3   metFORMIN  (GLUCOPHAGE -XR) 500 MG 24 hr tablet Take 1,000 mg by mouth 2 (two) times daily.     metoprolol  tartrate (LOPRESSOR ) 25 MG tablet TAKE 0.5 TABLETS BY MOUTH 2 TIMES DAILY. 90 tablet 0   nitroGLYCERIN  (NITROSTAT ) 0.4 MG SL tablet Place 1 tablet (0.4 mg total) under the tongue every 15 (fifteen) minutes as needed. 25  tablet 4   ONETOUCH VERIO test strip daily.     rosuvastatin  (CRESTOR ) 40 MG tablet TAKE 1 TABLET BY MOUTH EVERY DAY 90 tablet 3   Empagliflozin (JARDIANCE PO) Take 1 tablet by mouth daily in the afternoon.     scopolamine (TRANSDERM-SCOP) 1 MG/3DAYS      No facility-administered medications prior to visit.     Allergies:   Ozempic (0.25 or 0.5 mg-dose) [semaglutide(0.25 or 0.5mg -dos)]   Social History   Socioeconomic History   Marital status: Widowed    Spouse name: Not on file   Number of children: 2   Years of education: Not on file   Highest education level: Not on file  Occupational History    Employer: [employer]  Tobacco Use   Smoking status: Never   Smokeless tobacco: Never  Vaping Use   Vaping status: Never Used  Substance and Sexual Activity   Alcohol use: No   Drug use: No   Sexual activity: Not on file  Other Topics Concern   Not on file  Social History Narrative   He is a father of 2, grandfather 4. He does chew tobacco on daily basis but quit smoking 12 years ago. Never drinks a fall. 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   Social Drivers of Corporate investment banker Strain: Not on file  Food Insecurity: Not on file  Transportation Needs: Not on file  Physical Activity: Not on file  Stress: Not on file  Social Connections: Not on file     Family History:  The patient's family history includes Heart attack in his brother and father; Heart disease in his father; Heart failure in his father.   ROS:   Please see the history of present illness.    ROS All other systems are reviewed and are negative.   PHYSICAL EXAM:   VS:  BP (!) 152/76   Pulse 65   Ht 6' 1 (1.854 m)   Wt 197 lb 1.6 oz (89.4 kg)   SpO2 98%   BMI 26.00 kg/m   Wt Readings from Last 3 Encounters:  03/17/24 197 lb 1.6 oz (89.4 kg)  03/07/24 195 lb 6.4 oz (88.6 kg)  12/01/22 195 lb 12.8 oz (88.8 kg)      General: Alert, oriented x3, no distress, appears fit and younger than stated age.  Has healthy appearing pacemaker sites in both the right and left subclavian area. Head: no evidence of trauma, PERRL, EOMI, no exophtalmos or lid lag, no myxedema, no xanthelasma; normal ears, nose and oropharynx Neck: normal jugular venous pulsations and no hepatojugular reflux; brisk carotid pulses without delay and no carotid bruits Chest: clear to auscultation, no signs of consolidation by percussion or palpation, normal fremitus, symmetrical and full respiratory excursions Cardiovascular: normal position and quality of the apical impulse, regular rhythm, normal first and second heart sounds, no murmurs, rubs or gallops Abdomen: no tenderness or distention, no masses by palpation, no abnormal pulsatility or arterial bruits, normal bowel sounds, no hepatosplenomegaly Extremities: no clubbing, cyanosis or edema; 2+ radial, ulnar and brachial pulses bilaterally; 2+ right femoral, posterior tibial and dorsalis pedis pulses; 2+ left femoral, posterior tibial and dorsalis pedis  pulses; no subclavian or femoral bruits Neurological: grossly nonfocal Psych: Normal mood and affect     Studies/Labs Reviewed:   Lipid Panel     Component Value Date/Time   CHOL 154  07/28/2017 0817   TRIG 116 07/28/2017 0817   HDL 43 07/28/2017 0817   CHOLHDL 3.6 07/28/2017 0817   LDLCALC 88 07/28/2017 0817   LABVLDL 23 07/28/2017 0817   06/15/2020 labs Total cholesterol 122, HDL 35, LDL 63, triglycerides 133 Hemoglobin A1c 8.2%, creatinine 0.89, normal liver function tests 09/21/2020 LDL 65, hemoglobin A1c 8.0% 01/04/2021 Cholesterol 151, HDL 38 08/30/2021 triglycerides 155  05/08/2023 Cholesterol 132, HDL 48, TSH 4.49  11/27/2023 LDL 62, triglycerides 108, hemoglobin A1c 7.2%, creatinine 1.02  EKG: Personally reviewed most recent tracing from 03/07/2024 Which shows atrial paced, ventricular sensed rhythm with chronic right bundle branch block.  EKG Interpretation Date/Time:    Ventricular Rate:    PR Interval:    QRS Duration:    QT Interval:    QTC Calculation:   R Axis:      Text Interpretation:           Additional studies/ records that were reviewed today include:  Comprehensive pacemaker check performed in the office by me including lead capture threshold testing  ASSESSMENT:    1. SSS (sick sinus syndrome) (HCC)   2. NSVT (nonsustained ventricular tachycardia) (HCC)   3. Status post cardiac pacemaker procedure   4. Coronary artery disease involving native coronary artery of native heart without angina pectoris   5. Essential hypertension   6. Dyslipidemia (high LDL; low HDL)   7. Diabetes mellitus type 2 in nonobese [facility])       PLAN:  In order of problems listed above:  SSS: Virtually 100% atrial paced with a decent heart rate histogram distribution.  I do not think his exercise complaints are related to chronotropic incompetence.  He is much more active than the average 75 year old. NSVT: Taking a very low-dose of beta-blockers for lengthy  episodes of nonsustained VT that were recorded in the past, but we have not seen these in the last couple of years. PPM: Normal function of the right subclavian pacemaker.  The left sided pacemaker is deactivated (unipolar leads, and occluded subclavian vein on the left).  Continue remote downloads every 3 months.  (It will be interesting to see if  his pacemaker system could be considered to be MRI conditional once the generator is changed out in about 3 to 4 years.  Although he has a left-sided nonfunctioning pacemaker that is not MRI conditional, the leads remain attached and the device is inoperative). CAD: He does not have angina pectoris despite a very active lifestyle.  Low risk nuclear stress test in 2019 HTN: His blood pressure is high in the office, but very much normal even low normal at home.  Asked him to bring his home blood pressure monitor to his next office visit. HLP: Lipid parameters are in target range except for his chronically low HDL cholesterol.  Target LDL is less than 70 and most recently was measured at 59.  Continue statin. DM: Fair glycemic control for his age.  Medication Adjustments/Labs and Tests Ordered: Current medicines are reviewed at length with the patient today.  Concerns regarding medicines are outlined above.  Medication changes, Labs and Tests ordered today are listed in the Patient Instructions below. Patient Instructions  Medication Instructions:  No changes *If you need a refill on your cardiac medications before your next appointment, please call your pharmacy*  Lab Work: None ordered If you have labs (blood work) drawn today and your tests are completely normal, you will receive your results only by: MyChart Message (if you have MyChart)  OR A paper copy in the mail If you have any lab test that is abnormal or we need to change your treatment, we will call you to review the results.  Testing/Procedures: None ordered  Follow-Up: At [facility], you and your health needs are our priority.  As part of our continuing mission to provide you with exceptional heart care, our providers are all part of one team.  This team includes your primary Cardiologist (physician) and Advanced Practice Providers or APPs (Physician Assistants and Nurse Practitioners) who all work together to provide you with the care you need, when you need it.  Your next appointment:   1 year(s)  Provider:   Dr Francyne  We recommend signing up for the patient portal called MyChart.  Sign up information is provided on this After Visit Summary.  MyChart is used to connect with patients for Virtual Visits (Telemedicine).  Patients are able to view lab/test results, encounter notes, upcoming appointments, etc.  Non-urgent messages can be sent to your provider as well.   To learn more about what you can do with MyChart, go to [email or web address].          Signed, Jerel Francyne, MD  03/17/2024 5:24 PM    [facility] [address] [place], [place], [place]  [address] Phone: [phone]; Fax: [phone]

## 2024-03-17 NOTE — Patient Instructions (Signed)

## 2024-04-15 NOTE — Progress Notes (Signed)
 Remote PPM Transmission

## 2024-05-01 ENCOUNTER — Other Ambulatory Visit: Payer: Self-pay | Admitting: Cardiology

## 2024-05-04 ENCOUNTER — Ambulatory Visit

## 2024-05-04 DIAGNOSIS — I495 Sick sinus syndrome: Secondary | ICD-10-CM

## 2024-05-05 LAB — CUP PACEART REMOTE DEVICE CHECK
Battery Impedance: 1796 Ohm
Battery Remaining Longevity: 46 mo
Battery Voltage: 2.75 V
Brady Statistic AP VP Percent: 1 %
Brady Statistic AP VS Percent: 98 %
Brady Statistic AS VP Percent: 0 %
Brady Statistic AS VS Percent: 1 %
Date Time Interrogation Session: 20251022124757
Implantable Lead Connection Status: 753985
Implantable Lead Connection Status: 753985
Implantable Lead Implant Date: 20150403
Implantable Lead Implant Date: 20150403
Implantable Lead Location: 753859
Implantable Lead Location: 753860
Implantable Lead Model: 5076
Implantable Lead Model: 5076
Implantable Pulse Generator Implant Date: 20150403
Lead Channel Impedance Value: 507 Ohm
Lead Channel Impedance Value: 630 Ohm
Lead Channel Pacing Threshold Amplitude: 0.5 V
Lead Channel Pacing Threshold Amplitude: 0.625 V
Lead Channel Pacing Threshold Pulse Width: 0.4 ms
Lead Channel Pacing Threshold Pulse Width: 0.4 ms
Lead Channel Setting Pacing Amplitude: 1.5 V
Lead Channel Setting Pacing Amplitude: 2 V
Lead Channel Setting Pacing Pulse Width: 0.4 ms
Lead Channel Setting Sensing Sensitivity: 4 mV
Zone Setting Status: 755011
Zone Setting Status: 755011

## 2024-05-06 NOTE — Progress Notes (Signed)
 Remote PPM Transmission

## 2024-05-09 ENCOUNTER — Ambulatory Visit: Payer: Self-pay | Admitting: Cardiovascular Disease

## 2024-05-20 DIAGNOSIS — N182 Chronic kidney disease, stage 2 (mild): Secondary | ICD-10-CM | POA: Diagnosis not present

## 2024-05-20 DIAGNOSIS — R5383 Other fatigue: Secondary | ICD-10-CM | POA: Diagnosis not present

## 2024-05-20 DIAGNOSIS — I483 Typical atrial flutter: Secondary | ICD-10-CM | POA: Diagnosis not present

## 2024-05-20 DIAGNOSIS — E113293 Type 2 diabetes mellitus with mild nonproliferative diabetic retinopathy without macular edema, bilateral: Secondary | ICD-10-CM | POA: Diagnosis not present

## 2024-05-20 DIAGNOSIS — I2510 Atherosclerotic heart disease of native coronary artery without angina pectoris: Secondary | ICD-10-CM | POA: Diagnosis not present

## 2024-05-20 DIAGNOSIS — E782 Mixed hyperlipidemia: Secondary | ICD-10-CM | POA: Diagnosis not present

## 2024-05-20 DIAGNOSIS — E1122 Type 2 diabetes mellitus with diabetic chronic kidney disease: Secondary | ICD-10-CM | POA: Diagnosis not present

## 2024-05-27 DIAGNOSIS — I2510 Atherosclerotic heart disease of native coronary artery without angina pectoris: Secondary | ICD-10-CM | POA: Diagnosis not present

## 2024-05-27 DIAGNOSIS — N182 Chronic kidney disease, stage 2 (mild): Secondary | ICD-10-CM | POA: Diagnosis not present

## 2024-05-27 DIAGNOSIS — E1122 Type 2 diabetes mellitus with diabetic chronic kidney disease: Secondary | ICD-10-CM | POA: Diagnosis not present

## 2024-05-27 DIAGNOSIS — I483 Typical atrial flutter: Secondary | ICD-10-CM | POA: Diagnosis not present

## 2024-05-27 DIAGNOSIS — I10 Essential (primary) hypertension: Secondary | ICD-10-CM | POA: Diagnosis not present

## 2024-05-27 DIAGNOSIS — Z0000 Encounter for general adult medical examination without abnormal findings: Secondary | ICD-10-CM | POA: Diagnosis not present

## 2024-05-27 DIAGNOSIS — E113293 Type 2 diabetes mellitus with mild nonproliferative diabetic retinopathy without macular edema, bilateral: Secondary | ICD-10-CM | POA: Diagnosis not present

## 2024-05-27 DIAGNOSIS — E782 Mixed hyperlipidemia: Secondary | ICD-10-CM | POA: Diagnosis not present

## 2024-06-08 ENCOUNTER — Ambulatory Visit: Admitting: Podiatry

## 2024-06-08 ENCOUNTER — Encounter: Payer: Self-pay | Admitting: Podiatry

## 2024-06-08 DIAGNOSIS — M79675 Pain in left toe(s): Secondary | ICD-10-CM

## 2024-06-08 DIAGNOSIS — M79674 Pain in right toe(s): Secondary | ICD-10-CM

## 2024-06-08 DIAGNOSIS — E119 Type 2 diabetes mellitus without complications: Secondary | ICD-10-CM | POA: Diagnosis not present

## 2024-06-08 DIAGNOSIS — B351 Tinea unguium: Secondary | ICD-10-CM | POA: Diagnosis not present

## 2024-06-08 NOTE — Progress Notes (Signed)
 This patient returns to my office for at risk foot care.  This patient requires this care by a professional since this patient will be at risk due to having diabetes.  This patient is unable to cut nails himself since the patient cannot reach his nails.These nails are painful walking and wearing shoes.  This patient presents for at risk foot care today.  General Appearance  Alert, conversant and in no acute stress.  Vascular  Dorsalis pedis and posterior tibial  pulses are weakly  palpable  bilaterally.  Capillary return is within normal limits  bilaterally. Temperature is within normal limits  bilaterally.  Neurologic  Senn-Weinstein monofilament wire test within normal limits  bilaterally. Muscle power within normal limits bilaterally.  Nails Thick disfigured discolored nails with subungual debris  from hallux to fifth toes bilaterally. No evidence of bacterial infection or drainage bilaterally.  Orthopedic  No limitations of motion  feet .  No crepitus or effusions noted.  No bony pathology or digital deformities noted.  Skin  normotropic skin with no porokeratosis noted bilaterally.  No signs of infections or ulcers noted.     Onychomycosis  Pain in right toes  Pain in left toes  Consent was obtained for treatment procedures.   Mechanical debridement of nails 1-5  bilaterally performed with a nail nipper.  Filed with dremel without incident.    Return office visit     3 months                 Told patient to return for periodic foot care and evaluation due to potential at risk complications.   Helane Gunther DPM

## 2024-06-29 ENCOUNTER — Other Ambulatory Visit: Payer: Self-pay | Admitting: Cardiology

## 2024-07-06 ENCOUNTER — Other Ambulatory Visit: Payer: Self-pay | Admitting: Cardiology

## 2024-08-03 ENCOUNTER — Ambulatory Visit

## 2024-08-03 DIAGNOSIS — I495 Sick sinus syndrome: Secondary | ICD-10-CM | POA: Diagnosis not present

## 2024-08-08 ENCOUNTER — Ambulatory Visit: Payer: Self-pay | Admitting: Cardiovascular Disease

## 2024-08-08 LAB — CUP PACEART REMOTE DEVICE CHECK
Battery Impedance: 1823 Ohm
Battery Remaining Longevity: 45 mo
Battery Voltage: 2.76 V
Brady Statistic AP VP Percent: 1 %
Brady Statistic AP VS Percent: 98 %
Brady Statistic AS VP Percent: 0 %
Brady Statistic AS VS Percent: 1 %
Date Time Interrogation Session: 20260123131226
Implantable Lead Connection Status: 753985
Implantable Lead Connection Status: 753985
Implantable Lead Implant Date: 20150403
Implantable Lead Implant Date: 20150403
Implantable Lead Location: 753859
Implantable Lead Location: 753860
Implantable Lead Model: 5076
Implantable Lead Model: 5076
Implantable Pulse Generator Implant Date: 20150403
Lead Channel Impedance Value: 496 Ohm
Lead Channel Impedance Value: 611 Ohm
Lead Channel Pacing Threshold Amplitude: 0.625 V
Lead Channel Pacing Threshold Amplitude: 0.625 V
Lead Channel Pacing Threshold Pulse Width: 0.4 ms
Lead Channel Pacing Threshold Pulse Width: 0.4 ms
Lead Channel Setting Pacing Amplitude: 1.5 V
Lead Channel Setting Pacing Amplitude: 2 V
Lead Channel Setting Pacing Pulse Width: 0.4 ms
Lead Channel Setting Sensing Sensitivity: 2.8 mV
Zone Setting Status: 755011
Zone Setting Status: 755011

## 2024-08-08 NOTE — Progress Notes (Signed)
 Remote PPM Transmission

## 2024-09-08 ENCOUNTER — Ambulatory Visit: Admitting: Podiatry

## 2024-11-02 ENCOUNTER — Encounter

## 2025-02-01 ENCOUNTER — Encounter
